# Patient Record
Sex: Female | Born: 1940
Health system: Southern US, Community
[De-identification: ages and names within clinical notes are randomized; demographics above are authoritative.]

## PROBLEM LIST (undated history)

## (undated) DIAGNOSIS — M25561 Pain in right knee: Secondary | ICD-10-CM

## (undated) DIAGNOSIS — K219 Gastro-esophageal reflux disease without esophagitis: Secondary | ICD-10-CM

## (undated) DIAGNOSIS — G4700 Insomnia, unspecified: Secondary | ICD-10-CM

## (undated) DIAGNOSIS — Z9889 Other specified postprocedural states: Secondary | ICD-10-CM

## (undated) DIAGNOSIS — M5030 Other cervical disc degeneration, unspecified cervical region: Secondary | ICD-10-CM

## (undated) DIAGNOSIS — I10 Essential (primary) hypertension: Secondary | ICD-10-CM

## (undated) DIAGNOSIS — E7800 Pure hypercholesterolemia, unspecified: Secondary | ICD-10-CM

## (undated) DIAGNOSIS — R911 Solitary pulmonary nodule: Secondary | ICD-10-CM

## (undated) DIAGNOSIS — N183 Chronic kidney disease, stage 3 unspecified: Secondary | ICD-10-CM

## (undated) DIAGNOSIS — D696 Thrombocytopenia, unspecified: Secondary | ICD-10-CM

## (undated) DIAGNOSIS — R001 Bradycardia, unspecified: Secondary | ICD-10-CM

## (undated) DIAGNOSIS — J439 Emphysema, unspecified: Secondary | ICD-10-CM

## (undated) DIAGNOSIS — M069 Rheumatoid arthritis, unspecified: Secondary | ICD-10-CM

## (undated) DIAGNOSIS — M8580 Other specified disorders of bone density and structure, unspecified site: Secondary | ICD-10-CM

## (undated) DIAGNOSIS — F419 Anxiety disorder, unspecified: Secondary | ICD-10-CM

## (undated) DIAGNOSIS — R112 Nausea with vomiting, unspecified: Secondary | ICD-10-CM

## (undated) DIAGNOSIS — M1990 Unspecified osteoarthritis, unspecified site: Secondary | ICD-10-CM

## (undated) HISTORY — DX: Emphysema, unspecified: J43.9

## (undated) HISTORY — PX: OOPHORECTOMY: SHX86

## (undated) HISTORY — DX: Pure hypercholesterolemia, unspecified: E78.00

## (undated) HISTORY — DX: Anxiety disorder, unspecified: F41.9

## (undated) HISTORY — DX: Solitary pulmonary nodule: R91.1

## (undated) HISTORY — PX: HAND SURGERY: SHX662

## (undated) HISTORY — DX: Gastro-esophageal reflux disease without esophagitis: K21.9

## (undated) HISTORY — DX: Insomnia, unspecified: G47.00

## (undated) HISTORY — DX: Chronic kidney disease, stage 3 unspecified: N18.30

## (undated) HISTORY — DX: Rheumatoid arthritis, unspecified: M06.9

## (undated) HISTORY — PX: FOOT SURGERY: SHX648

## (undated) HISTORY — DX: Bradycardia, unspecified: R00.1

## (undated) HISTORY — PX: HEMORRHOID SURGERY: SHX153

## (undated) HISTORY — DX: Thrombocytopenia, unspecified: D69.6

## (undated) HISTORY — PX: COLONOSCOPY: SHX174

## (undated) HISTORY — DX: Essential (primary) hypertension: I10

## (undated) HISTORY — DX: Pain in right knee: M25.561

## (undated) HISTORY — DX: Other specified disorders of bone density and structure, unspecified site: M85.80

## (undated) HISTORY — DX: Other cervical disc degeneration, unspecified cervical region: M50.30

---

## 1978-07-27 HISTORY — PX: BARTHOLIN GLAND CYST EXCISION: SHX565

## 1986-07-27 HISTORY — PX: BREAST SURGERY: SHX581

## 1989-07-27 HISTORY — PX: ABDOMINAL HYSTERECTOMY: SHX81

## 1998-09-17 ENCOUNTER — Ambulatory Visit (HOSPITAL_COMMUNITY): Admission: RE | Admit: 1998-09-17 | Discharge: 1998-09-17 | Payer: Self-pay | Admitting: Orthopedic Surgery

## 1999-08-27 ENCOUNTER — Encounter: Admission: RE | Admit: 1999-08-27 | Discharge: 1999-08-27 | Payer: Self-pay | Admitting: Obstetrics and Gynecology

## 1999-08-27 ENCOUNTER — Encounter: Payer: Self-pay | Admitting: Obstetrics and Gynecology

## 1999-12-30 ENCOUNTER — Ambulatory Visit (HOSPITAL_BASED_OUTPATIENT_CLINIC_OR_DEPARTMENT_OTHER): Admission: RE | Admit: 1999-12-30 | Discharge: 1999-12-30 | Payer: Self-pay | Admitting: Orthopedic Surgery

## 2000-08-18 ENCOUNTER — Ambulatory Visit (HOSPITAL_BASED_OUTPATIENT_CLINIC_OR_DEPARTMENT_OTHER): Admission: RE | Admit: 2000-08-18 | Discharge: 2000-08-18 | Payer: Self-pay | Admitting: Orthopedic Surgery

## 2000-10-13 ENCOUNTER — Encounter: Payer: Self-pay | Admitting: Obstetrics and Gynecology

## 2000-10-13 ENCOUNTER — Encounter: Admission: RE | Admit: 2000-10-13 | Discharge: 2000-10-13 | Payer: Self-pay | Admitting: Obstetrics and Gynecology

## 2001-10-20 ENCOUNTER — Encounter: Admission: RE | Admit: 2001-10-20 | Discharge: 2001-10-20 | Payer: Self-pay | Admitting: Obstetrics and Gynecology

## 2001-10-20 ENCOUNTER — Encounter: Payer: Self-pay | Admitting: Obstetrics and Gynecology

## 2002-10-10 ENCOUNTER — Encounter: Payer: Self-pay | Admitting: Internal Medicine

## 2002-10-10 ENCOUNTER — Ambulatory Visit (HOSPITAL_COMMUNITY): Admission: RE | Admit: 2002-10-10 | Discharge: 2002-10-10 | Payer: Self-pay | Admitting: Internal Medicine

## 2002-11-18 ENCOUNTER — Emergency Department (HOSPITAL_COMMUNITY): Admission: EM | Admit: 2002-11-18 | Discharge: 2002-11-18 | Payer: Self-pay | Admitting: Emergency Medicine

## 2002-11-18 ENCOUNTER — Encounter: Payer: Self-pay | Admitting: Emergency Medicine

## 2003-03-22 ENCOUNTER — Encounter: Admission: RE | Admit: 2003-03-22 | Discharge: 2003-03-22 | Payer: Self-pay | Admitting: Obstetrics and Gynecology

## 2003-03-22 ENCOUNTER — Encounter: Payer: Self-pay | Admitting: Obstetrics and Gynecology

## 2003-07-12 ENCOUNTER — Ambulatory Visit (HOSPITAL_COMMUNITY): Admission: RE | Admit: 2003-07-12 | Discharge: 2003-07-12 | Payer: Self-pay | Admitting: Internal Medicine

## 2004-03-01 ENCOUNTER — Encounter: Admission: RE | Admit: 2004-03-01 | Discharge: 2004-03-01 | Payer: Self-pay | Admitting: Orthopedic Surgery

## 2004-03-21 ENCOUNTER — Ambulatory Visit (HOSPITAL_COMMUNITY): Admission: RE | Admit: 2004-03-21 | Discharge: 2004-03-21 | Payer: Self-pay | Admitting: Internal Medicine

## 2004-04-24 ENCOUNTER — Ambulatory Visit (HOSPITAL_COMMUNITY): Admission: RE | Admit: 2004-04-24 | Discharge: 2004-04-24 | Payer: Self-pay | Admitting: Obstetrics and Gynecology

## 2004-12-12 ENCOUNTER — Ambulatory Visit (HOSPITAL_COMMUNITY): Admission: RE | Admit: 2004-12-12 | Discharge: 2004-12-12 | Payer: Self-pay | Admitting: Family Medicine

## 2005-01-19 ENCOUNTER — Ambulatory Visit (HOSPITAL_COMMUNITY): Admission: RE | Admit: 2005-01-19 | Discharge: 2005-01-19 | Payer: Self-pay | Admitting: Otolaryngology

## 2005-02-03 ENCOUNTER — Ambulatory Visit (HOSPITAL_COMMUNITY): Admission: RE | Admit: 2005-02-03 | Discharge: 2005-02-03 | Payer: Self-pay | Admitting: Colon and Rectal Surgery

## 2006-01-26 ENCOUNTER — Ambulatory Visit (HOSPITAL_COMMUNITY): Admission: RE | Admit: 2006-01-26 | Discharge: 2006-01-26 | Payer: Self-pay | Admitting: Internal Medicine

## 2006-06-04 ENCOUNTER — Ambulatory Visit (HOSPITAL_COMMUNITY): Admission: RE | Admit: 2006-06-04 | Discharge: 2006-06-04 | Payer: Self-pay | Admitting: Otolaryngology

## 2007-12-28 ENCOUNTER — Other Ambulatory Visit: Admission: RE | Admit: 2007-12-28 | Discharge: 2007-12-28 | Payer: Self-pay | Admitting: Obstetrics and Gynecology

## 2008-01-12 ENCOUNTER — Ambulatory Visit (HOSPITAL_COMMUNITY): Admission: RE | Admit: 2008-01-12 | Discharge: 2008-01-12 | Payer: Self-pay | Admitting: Internal Medicine

## 2008-01-18 ENCOUNTER — Emergency Department (HOSPITAL_COMMUNITY): Admission: EM | Admit: 2008-01-18 | Discharge: 2008-01-18 | Payer: Self-pay | Admitting: Emergency Medicine

## 2008-01-25 ENCOUNTER — Ambulatory Visit (HOSPITAL_COMMUNITY): Admission: RE | Admit: 2008-01-25 | Discharge: 2008-01-25 | Payer: Self-pay | Admitting: Internal Medicine

## 2008-04-18 ENCOUNTER — Ambulatory Visit (HOSPITAL_COMMUNITY): Admission: RE | Admit: 2008-04-18 | Discharge: 2008-04-18 | Payer: Self-pay | Admitting: Internal Medicine

## 2008-05-03 ENCOUNTER — Ambulatory Visit: Payer: Self-pay | Admitting: Pulmonary Disease

## 2008-05-03 DIAGNOSIS — M069 Rheumatoid arthritis, unspecified: Secondary | ICD-10-CM | POA: Insufficient documentation

## 2008-05-03 DIAGNOSIS — R930 Abnormal findings on diagnostic imaging of skull and head, not elsewhere classified: Secondary | ICD-10-CM | POA: Insufficient documentation

## 2008-05-07 ENCOUNTER — Ambulatory Visit: Admission: RE | Admit: 2008-05-07 | Discharge: 2008-05-07 | Payer: Self-pay | Admitting: Pulmonary Disease

## 2008-05-07 ENCOUNTER — Ambulatory Visit: Payer: Self-pay | Admitting: Pulmonary Disease

## 2008-05-07 ENCOUNTER — Encounter: Payer: Self-pay | Admitting: Pulmonary Disease

## 2008-05-11 ENCOUNTER — Ambulatory Visit: Payer: Self-pay | Admitting: Pulmonary Disease

## 2008-06-19 ENCOUNTER — Ambulatory Visit: Payer: Self-pay | Admitting: Pulmonary Disease

## 2008-09-17 ENCOUNTER — Ambulatory Visit: Payer: Self-pay | Admitting: Pulmonary Disease

## 2008-11-08 ENCOUNTER — Encounter: Payer: Self-pay | Admitting: Pulmonary Disease

## 2008-12-12 ENCOUNTER — Ambulatory Visit (HOSPITAL_COMMUNITY): Admission: RE | Admit: 2008-12-12 | Discharge: 2008-12-12 | Payer: Self-pay | Admitting: Family Medicine

## 2008-12-31 ENCOUNTER — Other Ambulatory Visit: Admission: RE | Admit: 2008-12-31 | Discharge: 2008-12-31 | Payer: Self-pay | Admitting: Obstetrics and Gynecology

## 2008-12-31 ENCOUNTER — Ambulatory Visit: Payer: Self-pay | Admitting: Obstetrics and Gynecology

## 2008-12-31 ENCOUNTER — Encounter: Payer: Self-pay | Admitting: Obstetrics and Gynecology

## 2009-02-20 ENCOUNTER — Ambulatory Visit (HOSPITAL_COMMUNITY): Admission: RE | Admit: 2009-02-20 | Discharge: 2009-02-20 | Payer: Self-pay | Admitting: Obstetrics and Gynecology

## 2009-02-25 ENCOUNTER — Ambulatory Visit: Payer: Self-pay | Admitting: Pulmonary Disease

## 2009-02-25 DIAGNOSIS — J310 Chronic rhinitis: Secondary | ICD-10-CM | POA: Insufficient documentation

## 2009-05-17 ENCOUNTER — Ambulatory Visit (HOSPITAL_COMMUNITY): Admission: RE | Admit: 2009-05-17 | Discharge: 2009-05-17 | Payer: Self-pay | Admitting: Internal Medicine

## 2009-05-23 ENCOUNTER — Encounter: Payer: Self-pay | Admitting: Pulmonary Disease

## 2009-05-23 ENCOUNTER — Ambulatory Visit (HOSPITAL_COMMUNITY): Admission: RE | Admit: 2009-05-23 | Discharge: 2009-05-23 | Payer: Self-pay | Admitting: Internal Medicine

## 2009-06-03 ENCOUNTER — Encounter: Payer: Self-pay | Admitting: Emergency Medicine

## 2009-08-05 ENCOUNTER — Ambulatory Visit: Payer: Self-pay | Admitting: Pulmonary Disease

## 2009-09-02 ENCOUNTER — Encounter: Payer: Self-pay | Admitting: Pulmonary Disease

## 2010-01-09 ENCOUNTER — Ambulatory Visit: Payer: Self-pay | Admitting: Obstetrics and Gynecology

## 2010-01-09 ENCOUNTER — Other Ambulatory Visit: Admission: RE | Admit: 2010-01-09 | Discharge: 2010-01-09 | Payer: Self-pay | Admitting: Obstetrics and Gynecology

## 2010-01-14 ENCOUNTER — Ambulatory Visit: Payer: Self-pay | Admitting: Obstetrics and Gynecology

## 2010-01-22 ENCOUNTER — Encounter: Payer: Self-pay | Admitting: Pulmonary Disease

## 2010-01-28 ENCOUNTER — Ambulatory Visit (HOSPITAL_COMMUNITY): Admission: RE | Admit: 2010-01-28 | Discharge: 2010-01-28 | Payer: Self-pay | Admitting: Internal Medicine

## 2010-02-10 ENCOUNTER — Ambulatory Visit: Payer: Self-pay | Admitting: Pulmonary Disease

## 2010-02-27 ENCOUNTER — Ambulatory Visit (HOSPITAL_COMMUNITY): Admission: RE | Admit: 2010-02-27 | Discharge: 2010-02-27 | Payer: Self-pay | Admitting: Internal Medicine

## 2010-04-02 ENCOUNTER — Encounter: Payer: Self-pay | Admitting: Pulmonary Disease

## 2010-08-26 NOTE — Letter (Signed)
Summary: [name] MD  [name] MD   Imported By: Lennie Odor 04/21/2010 12:04:52  _____________________________________________________________________  External Attachment:    Type:   Image     Comment:   External Document

## 2010-08-26 NOTE — Assessment & Plan Note (Signed)
Summary: 6 months w/cxr/apc   Copy to:  [name] Primary Provider/Referring Provider:  Elfredia Nevins  CC:  Follow-up visit with cxr, Breathing is doingt fine, no cough, and Pt states she is doing fine and have no complaints today.  History of Present Illness: I saw Ms. Blake in follow up for her abnormal chest xray in the setting of rheumatoid arthritis.  She has been doing well.  She does not have cough, wheeze, sputum, or chest pain.  She has been getting a tickle in her throat.  She feels a globus sensation.  She has been getting sinus congestion and feels like there is drainage in her throat.  She was tried on anti-reflux therapy, but this did not help her throat.  She has nasonex and nasal irrigation, but does not use this on a regular basis.  She had her MTX dose decreased recently.  She was having fatigue, and feeling depressed.  These symptoms improved after the MTX dose was decreased.  She is now walking 2 to 4 miles per day w/o difficulty.    CXR  Procedure date:  02/10/2010  Findings:      CHEST - 2 VIEW   Comparison: 08/05/2009   Findings: Heart size remains normal.  Lungs mildly hyperaerated. Stable peribronchial thickening with mildly accentuated lung markings as before.  No discrete nodules or infiltrates.  No pleural fluid.   Osseous structures intact with stable scoliosis.   IMPRESSION: Chronic changes as above - no acute findings or interval change.   Preventive Screening-Counseling & Management  Alcohol-Tobacco     Smoking Status: quit     Packs/Day: 0.75     Year Started: At age 49     Year Quit: 2007  Allergies: 1)  ! * All Narcotics  Past History:  Past Medical History: Reviewed history from 05/03/2008 and no changes required. Rheumatoid arthritis Dyslipidemia Colon polyps Hemorrhoids  Past Surgical History: Reviewed history from 05/03/2008 and no changes required. Left hand surgery Removal Morton neuroma Benign left breast  cyst removal Hysterectomy Hemorrhoidectomy  Social History: Smoking Status:  quit Packs/Day:  0.75  Vital Signs:  Patient profile:   70 year old female Height:      67 inches Weight:      169.4 pounds O2 Sat:      96 % on Room air Temp:     98.5 degrees F oral Pulse rate:   66 / minute BP sitting:   120 / 72  (right arm) Cuff size:   regular  Vitals Entered By: Carver Fila (February 10, 2010 8:56 AM)  O2 Flow:  Room air CC: Follow-up visit with cxr, Breathing is doingt fine, no cough, Pt states she is doing fine and have no complaints today Is Patient Diabetic? No Comments meds and allergies updated Phone number updated  Carver Fila  February 10, 2010 8:57 AM     Physical Exam  General:  normal appearance and healthy appearing.   Nose:  clear nasal discharge.   Mouth:  no oral lesion Neck:  no JVD Lungs:  Decreased breath sounds, good air entry, no wheezing or rales Heart:  regular rhythm, normal rate, and no murmurs.   Extremities:  no edema or cyanosis Cervical Nodes:  no significant adenopathy   Impression & Recommendations:  Problem # 1:  ABNORMAL CHEST XRAY (ICD-793.1)  Her chest xray has been stable.  She did not have any evidence for recurrence of the rheumatoid lung involvement, or any evidence to suggest  maligancy.   Will plan on repeating her chest xray in six months.  I have advised her that if her chest xray remains stable at next visit would then consider stretching her visits out to every 8 to 12 months.  Problem # 2:  ARTHRITIS, RHEUMATOID (ICD-714.0)  She has her MTX dose decreased recently, but does not appear to have any worsening of her pulmonary symptoms.  She is to f/u with rheumatology.  Problem # 3:  RHINITIS (ICD-472.0)  She has post-nasal drip with throat irritation.  I have advised her to use nasal irrigation and nasonex on a regular basis for the next several weeks  Medications Added to Medication List This Visit: 1)  Methotrexate 2.5 Mg  Tabs (Methotrexate sodium) .... 5 tabs by mouth weekly 2)  Nasonex 50 Mcg/act Susp (Mometasone furoate) .... Two sprays once daily as needed 3)  Caltrate 600+d 600-400 Mg-unit Tabs (Calcium carbonate-vitamin d) .... Once daily 4)  Vitamin D High Potency 1000 Unit Caps (Cholecalciferol) .... Once daily  Complete Medication List: 1)  Methotrexate 2.5 Mg Tabs (Methotrexate sodium) .... 5 tabs by mouth weekly 2)  Folic Acid 800 Mcg Tabs (Folic acid) .Marland Kitchen.. 1 by mouth daily 3)  Nasonex 50 Mcg/act Susp (Mometasone furoate) .... Two sprays once daily as needed 4)  Crestor 10 Mg Tabs (Rosuvastatin calcium) .Marland Kitchen.. 1 by mouth daily 5)  Omeprazole 20 Mg Cpdr (Omeprazole) .Marland Kitchen.. 1 by mouth daily 6)  Alprazolam 0.5 Mg Tabs (Alprazolam) .Marland Kitchen.. 1 by mouth at bedtime 7)  Caltrate 600+d 600-400 Mg-unit Tabs (Calcium carbonate-vitamin d) .... Once daily 8)  Vitamin D High Potency 1000 Unit Caps (Cholecalciferol) .... Once daily  Other Orders: T-2 View CXR (71020TC) Est. Patient Level III (16109)  Patient Instructions: 1)  Follow up in 6 months with Chest xray   Immunization History:  Influenza Immunization History:    Influenza:  historical (04/26/2009)

## 2010-08-26 NOTE — Assessment & Plan Note (Signed)
Summary: rov/jd   Copy to:  [name] Primary Provider/Referring Provider:  Elfredia Nevins  CC:  Follow-up rhinistis and abnormal cxr. CXR done today.Marland Kitchen  History of Present Illness: I saw Bethany Grant in follow up for her abnormal chest xray in the setting of rheumatoid arthritis.  She had to stop MTX recently due to concern for throat congestion.  She has since be restarted on MTX.  She was also started on therapy for GERD, and this improved her throat irritation.  Otherwise she has been doing well.  She denies cough, wheeze, chest pain, fever, dyspnea, or hemoptysis.  She had CT chest in Oct which showed decrease in size of LLL nodule.  CT of Chest  Procedure date:  05/23/2009  Findings:       Findings:   Atherosclerotic calcifications aorta, ascending aorta 3.8 x 3.6 cm   diameter image 29.   No thoracic adenopathy, with hilar assessment limited by lack of IV   contrast.   Visualized portion of upper abdomen unremarkable.   Dextroconvex thoracic scoliosis.   Question bone island in a posterior right rib image 35.    Lungs appear hyperexpanded with flattening of diaphragms.   Small focus of questionable scarring at right apex unchanged, 6.6   mm diameter.   Minimal peribronchial thickening.   Interstitial changes at azygo esophageal recess of right lower lobe   are stable.   Left lower lobe nodule decreased in size, 4.5 to 3.3 mm, image 44.   Peripheral and subpleural reticular changes in posterior left lower   lobe have decreased in prominence.   No new or enlarging pulmonary masses / nodules are identified.   No new pulmonary infiltrate, pleural effusion or pneumothorax.    IMPRESSION:   Stable parenchymal lung changes and left lower lobe nodule as   above.   No new abnormalities.    Read By:  Lollie Marrow,  M.D.  CXR  Procedure date:  08/05/2009  Findings:      Findings: No active infiltrate or effusion is seen.  The lungs are slightly hyperaerated  with mild peribronchial thickening noted. The small lung parenchymal opacities noted on CT of the chest previously are too small to be detected by chest x-ray but no lung nodule is currently seen.  The heart is within upper limits of normal.  There are degenerative changes in the thoracic spine with mild scoliosis present.   IMPRESSION: No active lung disease.  Slight hyperaeration.     Current Medications (verified): 1)  Crestor 10 Mg Tabs (Rosuvastatin Calcium) .Marland Kitchen.. 1 By Mouth Daily 2)  Alprazolam 0.5 Mg Tabs (Alprazolam) .Marland Kitchen.. 1 By Mouth At Bedtime 3)  Methotrexate 2.5 Mg Tabs (Methotrexate Sodium) .... 8 Tabs By Mouth Weekly 4)  Folic Acid 800 Mcg Tabs (Folic Acid) .Marland Kitchen.. 1 By Mouth Daily 5)  Nasonex 50 Mcg/act Susp (Mometasone Furoate) .... Two Sprays Once Daily 6)  Omeprazole 20 Mg Cpdr (Omeprazole) .Marland Kitchen.. 1 By Mouth Daily  Allergies (verified): 1)  ! * All Narcotics  Past History:  Past Medical History: Last updated: 05/03/2008 Rheumatoid arthritis Dyslipidemia Colon polyps Hemorrhoids  Past Surgical History: Last updated: 05/03/2008 Left hand surgery Removal Morton neuroma Benign left breast cyst removal Hysterectomy Hemorrhoidectomy  Family History: Last updated: 05/03/2008 Father - Old age Mother - Lung cancer Sister - Rheumatoid arthritis, Lung cancer Sister - Lung cancer Brother - Lung cancer  Social History: Last updated: 05/03/2008 Married.  Three kids.  Worked in Engineer, site.  Vital  Signs:  Patient profile:   70 year old female Height:      67 inches (170.18 cm) Weight:      179 pounds (81.36 kg) BMI:     28.14 O2 Sat:      97 % on Room air Temp:     97.9 degrees F (36.61 degrees C) oral Pulse rate:   88 / minute BP sitting:   132 / 78  (left arm) Cuff size:   regular  Vitals Entered By: Michel Bickers CMA (August 05, 2009 2:26 PM)  O2 Flow:  Room air  Physical Exam  General:  normal appearance and healthy appearing.   Nose:  clear  nasal discharge.   Mouth:  no oral lesion Neck:  no JVD Lungs:  Decreased breath sounds, good air entry, no wheezing or rales Heart:  regular rhythm, normal rate, and no murmurs.   Abdomen:  bowel sounds positive; abdomen soft and non-tender without masses, or organomegaly Extremities:  no edema or cyanosis Cervical Nodes:  no significant adenopathy   Impression & Recommendations:  Problem # 1:  ABNORMAL CHEST XRAY (ICD-793.1)  Her chest xray has been stable.  She did not have any evidence for recurrence of the rheumatoid lung involvement, or any evidence to suggest maligancy.   Will plan on repeating her chest xray in six months.  Problem # 2:  ARTHRITIS, RHEUMATOID (ICD-714.0) Per rheumatology.  Medications Added to Medication List This Visit: 1)  Omeprazole 20 Mg Cpdr (Omeprazole) .Marland Kitchen.. 1 by mouth daily  Complete Medication List: 1)  Crestor 10 Mg Tabs (Rosuvastatin calcium) .Marland Kitchen.. 1 by mouth daily 2)  Alprazolam 0.5 Mg Tabs (Alprazolam) .Marland Kitchen.. 1 by mouth at bedtime 3)  Methotrexate 2.5 Mg Tabs (Methotrexate sodium) .... 8 tabs by mouth weekly 4)  Folic Acid 800 Mcg Tabs (Folic acid) .Marland Kitchen.. 1 by mouth daily 5)  Nasonex 50 Mcg/act Susp (Mometasone furoate) .... Two sprays once daily 6)  Omeprazole 20 Mg Cpdr (Omeprazole) .Marland Kitchen.. 1 by mouth daily  Other Orders: T-2 View CXR (71020TC) Est. Patient Level III (16109)  Patient Instructions: 1)  Follow up in 6 months with a chest xray

## 2010-08-27 ENCOUNTER — Ambulatory Visit: Admit: 2010-08-27 | Payer: Self-pay | Admitting: Pulmonary Disease

## 2010-08-27 ENCOUNTER — Ambulatory Visit (INDEPENDENT_AMBULATORY_CARE_PROVIDER_SITE_OTHER): Payer: MEDICARE | Admitting: Pulmonary Disease

## 2010-08-27 ENCOUNTER — Ambulatory Visit (INDEPENDENT_AMBULATORY_CARE_PROVIDER_SITE_OTHER)
Admission: RE | Admit: 2010-08-27 | Discharge: 2010-08-27 | Disposition: A | Discharge status: Home / Self Care | Payer: MEDICARE | Source: Ambulatory Visit | Attending: Pulmonary Disease | Admitting: Pulmonary Disease

## 2010-08-27 ENCOUNTER — Other Ambulatory Visit: Payer: Self-pay | Admitting: Pulmonary Disease

## 2010-08-27 ENCOUNTER — Encounter: Payer: Self-pay | Admitting: Pulmonary Disease

## 2010-08-27 DIAGNOSIS — R9389 Abnormal findings on diagnostic imaging of other specified body structures: Secondary | ICD-10-CM

## 2010-08-27 DIAGNOSIS — M069 Rheumatoid arthritis, unspecified: Secondary | ICD-10-CM

## 2010-08-27 DIAGNOSIS — R918 Other nonspecific abnormal finding of lung field: Secondary | ICD-10-CM

## 2010-08-27 DIAGNOSIS — R911 Solitary pulmonary nodule: Secondary | ICD-10-CM

## 2010-08-27 DIAGNOSIS — J310 Chronic rhinitis: Secondary | ICD-10-CM

## 2010-08-29 NOTE — Letter (Signed)
Summary: [name] MD  [name] MD   Imported By: Sherian Rein 09/14/2009 11:29:44  _____________________________________________________________________  External Attachment:    Type:   Image     Comment:   External Document

## 2010-09-03 NOTE — Assessment & Plan Note (Signed)
Summary: 6 month rov   Vital Signs:  Patient profile:   70 year old female Height:      67 inches Weight:      173.50 pounds BMI:     27.27 O2 Sat:      99 % on Room air Temp:     97.9 degrees F oral Pulse rate:   73 / minute BP sitting:   120 / 70  (left arm) Cuff size:   regular  Vitals Entered By: Carver Fila (August 27, 2010 1:37 PM)  O2 Flow:  Room air CC: 6 month f/u w/ cxr. Pt states her breathing has been fine and has no complaints today Comments meds and allergies updated Phone number updated Carver Fila  August 27, 2010 1:37 PM    Copy to:  [name] Primary Provider/Referring Provider:  Elfredia Nevins  CC:  6 month f/u w/ cxr. Pt states her breathing has been fine and has no complaints today.  History of Present Illness: I saw Ms. Haddox in follow up for her abnormal chest xray in the setting of rheumatoid arthritis.  She has been doing well.  She had a cold earlier in winter, but this has resolved.  She denies fever, cough, sputum, chest pain, wheeze, or hemoptysis.  Her RA is controlled on methotrexate, although she occasionally has to use some prednisone.   Preventive Screening-Counseling & Management  Alcohol-Tobacco     Smoking Status: quit  EKG  Procedure date:  08/27/2010  Findings:       Comparison: 02/10/2010    Findings: Trachea is midline.  Heart size normal.  Thoracic aorta   is calcified.  Mild biapical pleural parenchymal scarring.  Lungs   are otherwise clear.  No pleural fluid.    IMPRESSION:   No acute findings.  Current Medications (verified): 1)  Methotrexate 2.5 Mg Tabs (Methotrexate Sodium) .... 8 Tabs By Mouth Weekly 2)  Folic Acid 800 Mcg Tabs (Folic Acid) .Marland Kitchen.. 1 By Mouth Daily 3)  Nasonex 50 Mcg/act Susp (Mometasone Furoate) .... Two Sprays Once Daily As Needed 4)  Crestor 10 Mg Tabs (Rosuvastatin Calcium) .Marland Kitchen.. 1 By Mouth Daily 5)  Alprazolam 0.5 Mg Tabs (Alprazolam) .Marland Kitchen.. 1 By Mouth At Bedtime 6)  Caltrate 600+d  600-400 Mg-Unit Tabs (Calcium Carbonate-Vitamin D) .... Once Daily 7)  Vitamin D High Potency 1000 Unit Caps (Cholecalciferol) .... Once Daily  Allergies (verified): 1)  ! * All Narcotics  Past History:  Past Medical History: Reviewed history from 05/03/2008 and no changes required. Rheumatoid arthritis Dyslipidemia Colon polyps Hemorrhoids  Past Surgical History: Reviewed history from 05/03/2008 and no changes required. Left hand surgery Removal Morton neuroma Benign left breast cyst removal Hysterectomy Hemorrhoidectomy  Social History: Married.  Three kids.  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 Quit 2007. 1 ppd. started in teens.   Physical Exam  General:  normal appearance and healthy appearing.   Nose:  clear nasal discharge.   Mouth:  no oral lesion Neck:  no JVD Lungs:  Decreased breath sounds, good air entry, no wheezing or rales Heart:  regular rhythm, normal rate, and no murmurs.   Extremities:  no edema or cyanosis Neurologic:  normal CN II-XII and strength normal.   Cervical Nodes:  no significant adenopathy Psych:  alert and cooperative; normal mood and affect; normal attention span and concentration   Impression & Recommendations:  Problem # 1:  ABNORMAL CHEST XRAY (ICD-793.1) Stable, and todays CXR is clear.  She has family history of lung cancer, and would like to continue radiographic monitoring for now.  Will f/u CXR in 9 months, and continue clinical monitoring.  Problem # 2:  ARTHRITIS, RHEUMATOID (ICD-714.0) She is to f/u with rheumatology.  Problem # 3:  RHINITIS (ICD-472.0)  Stable.  Medications Added to Medication List This Visit: 1)  Methotrexate 2.5 Mg Tabs (Methotrexate sodium) .... 8 tabs by mouth weekly  Complete Medication List: 1)  Methotrexate 2.5 Mg Tabs (Methotrexate sodium) .... 8 tabs by mouth weekly 2)  Folic Acid 800 Mcg Tabs (Folic acid) .Marland Kitchen.. 1 by mouth daily 3)  Nasonex 50 Mcg/act Susp (Mometasone  furoate) .... Two sprays once daily as needed 4)  Crestor 10 Mg Tabs (Rosuvastatin calcium) .Marland Kitchen.. 1 by mouth daily 5)  Alprazolam 0.5 Mg Tabs (Alprazolam) .Marland Kitchen.. 1 by mouth at bedtime 6)  Caltrate 600+d 600-400 Mg-unit Tabs (Calcium carbonate-vitamin d) .... Once daily 7)  Vitamin D High Potency 1000 Unit Caps (Cholecalciferol) .... Once daily  Other Orders: T-2 View CXR (71020TC) Est. Patient Level III (57846)  Patient Instructions: 1)  Follow up in 9 months with Chest xray   Orders Added: 1)  T-2 View CXR [71020TC] 2)  Est. Patient Level III [96295]   Immunization History:  Influenza Immunization History:    Influenza:  historical (04/26/2010)   Immunization History:  Influenza Immunization History:    Influenza:  Historical (04/26/2010)

## 2010-10-07 ENCOUNTER — Ambulatory Visit (INDEPENDENT_AMBULATORY_CARE_PROVIDER_SITE_OTHER): Payer: MEDICARE | Admitting: Obstetrics and Gynecology

## 2010-10-07 DIAGNOSIS — B3731 Acute candidiasis of vulva and vagina: Secondary | ICD-10-CM

## 2010-10-07 DIAGNOSIS — B373 Candidiasis of vulva and vagina: Secondary | ICD-10-CM

## 2010-10-07 DIAGNOSIS — N766 Ulceration of vulva: Secondary | ICD-10-CM

## 2010-10-07 DIAGNOSIS — N898 Other specified noninflammatory disorders of vagina: Secondary | ICD-10-CM

## 2010-10-07 DIAGNOSIS — N952 Postmenopausal atrophic vaginitis: Secondary | ICD-10-CM

## 2010-11-24 ENCOUNTER — Encounter: Payer: Self-pay | Admitting: Pulmonary Disease

## 2010-12-09 NOTE — Op Note (Signed)
NAMEBERRY, GALLACHER                ACCOUNT NO.:  [id number]   MEDICAL RECORD NO.:  [id number]          PATIENT TYPE:  AMB   LOCATION:  CARD                         FACILITY:  [facility]   PHYSICIAN:  Coralyn Helling, MD        DATE OF BIRTH:  1941-05-23   DATE OF PROCEDURE:  05/07/2008  DATE OF DISCHARGE:                               OPERATIVE REPORT   PREOPERATIVE DIAGNOSIS:  Rule out lung cancer.   POSTOPERATIVE DIAGNOSIS:  Rule out lung cancer.   PROCEDURE:  Bronchoscopy with bronchoalveolar lavage, brush cytology and  transbronchial biopsy from the left lower lobe.   Ms. Scifres is a 70 year old female who has a history of rheumatoid  arthritis.  She has alveolar and interstitial infiltrates on her CT scan  of the chest which had progressed from July to September.  She does also  have a strong family history of lung cancer and has a previous history  of tobacco abuse.  She is therefore scheduled for further evaluation of  this with bronchoscopy.  The procedure was explained to the patient.  Risks were detailed as bleeding, infection, pneumothorax and  nondiagnosis.   DESCRIPTION OF PROCEDURE:  The patient was brought to the bronchoscopy  suite.  She was given a total of 6 mg of Versed and 50 mcg of fentanyl  intravenously for sedation and analgesia.  Cetacaine spray was applied  to the posterior pharynx for topical anesthesia.  The bronchoscope was  entered orally.  The vocal cords visualized and had normal motion.  A  total of 12 mL of 2% lidocaine was instilled topical anesthesia.  The  bronchoscope was entered into the trachea.  The carina was visualized.  There was clear secretions which were easily cleared with suctioning.  The bronchoscope was then entered in the right main bronchus.  Again  there was clear secretions.  Otherwise, there is no obvious  endobronchial lesions.  The right upper, middle and lower lobe orifices  were well visualized and appeared normal.  The  bronchoscope was then  entered into the left main bronchus.  The left upper and lingular lobe  orifices were visualized.  There were no obvious endobronchial lesions.  The bronchoscope was then entered in the left lower lobe and 120 mL of  saline was instilled to the left lower lobe with approximately 35 mL of  white cloudy fluid returned.  Then brush cytology was done from the left  lower lobe.  Then using fluoroscopic guidance, transbronchial biopsy was  obtained from the left lower lobe.  There was a minimal amount of  bleeding which resolved after installation of 20 mL of iced saline.  There was of no other obvious complications.  The bronchoscope was  withdrawn.   The patient was returned to the recovery room in stable condition.  A  postprocedure chest x-ray is pending at this time.   The specimens will be sent for cytology, microbiology and surgical  pathology.  I will call the patient with results of the bronchoscopy and  follow up with her in the office later this week.  Coralyn Helling, MD  Electronically Signed     VS/MEDQ  D:  05/07/2008  T:  05/07/2008  Job:  [id number]

## 2010-12-12 NOTE — Op Note (Signed)
Bethany Grant, Bethany Grant                          ACCOUNT NO.:  [id number]   MEDICAL RECORD NO.:  [id number]                   PATIENT TYPE:  AMB   LOCATION:  DAY                                  FACILITY:  [facility]   PHYSICIAN:  Lionel December, M.D.                 DATE OF BIRTH:  08-05-1940   DATE OF PROCEDURE:  07/12/2003  DATE OF DISCHARGE:                                 OPERATIVE REPORT   PROCEDURE:  Total colonoscopy.   INDICATIONS FOR PROCEDURE:  Bethany Grant is a 70 year old Caucasian female who is  undergoing screening colonoscopy.  She does give history of chronic  constipation which has gotten worse over the last six months; however, she  has not experienced any abdominal pain, weight loss, or rectal bleeding.  Family history is negative for colorectal carcinoma.  The procedure and  risks were reviewed with the patient, and informed consent was obtained.   PREOPERATIVE MEDICATIONS:  Demerol 50 mg IV, Versed 7 mg IV in divided dose.   FINDINGS:  The procedure was performed in the endoscopy suite.  The  patient's vital signs and O2 saturations were monitored during the procedure  and remained stable.  The patient was placed in the left lateral recumbent  position and rectal examination performed.  She had sentinel skin tags.  Digital exam was normal.  The Olympus videoscope was placed into the rectum  and advanced into the region of the sigmoid colon and beyond.  The  preparation was satisfactory.  The scope was passed to the cecum which was  identified by the appendiceal orifice and ileocecal valve.  There was a  small polyp next to the appendiceal orifice which was ablated by cold  biopsy.  As the scope was withdrawn, the colonic mucosa was once again  carefully examined, and there were no other mucosa abnormalities.  The  rectal mucosa similarly was normal.  The scope was retroflexed to examine  the anorectal junction, and small hemorrhoids were noted below the dentate  line.  The  endoscope was straightened and withdrawn.  The patient tolerated  the procedure well.   FINAL DIAGNOSES:  1. Examination performed to the cecum.  2. Small cecal polyp that was ablated by cold biopsy.  3. Small external hemorrhoids.  Otherwise normal examination.   RECOMMENDATIONS:  1. Standard instructions given.  2. Citrucel or equivalent up to 4 g p.o. q.h.s.  3. Colace two tablets at bedtime.  4. I will contact the patient with the biopsy results and further     recommendations.      ___________________________________________                                            Lionel December, M.D.   NR/MEDQ  D:  07/12/2003  T:  07/12/2003  Job:  [id number]   cc:   Madelin Rear. Sherwood Gambler, M.D.  [address]  [place]  [place] [address]  Fax: [phone]

## 2010-12-12 NOTE — Op Note (Signed)
[facility]  Patient:    Bethany Grant, Bethany Grant                       MRN: [id number] Proc. Date: 12/30/99 Adm. Date:  [id number] Attending:  Ronne Binning CC:         Nicki Reaper, M.D. (2)                           Operative Report  PREOPERATIVE DIAGNOSIS:  Rheumatoid arthritis with synovitis of metacarpal phalangeal joint, left ring finger.  POSTOPERATIVE DIAGNOSIS:  Rheumatoid arthritis with synovitis of metacarpal phalangeal joint, left ring finger.  OPERATION:  Arthroscopic synovectomy, metacarpal phalangeal joint, left ring finger.  SURGEON:  Nicki Reaper, M.D.  ASSISTANT:  Joaquin Courts, R.N.  ANESTHESIA:  Axillary block.  ANESTHESIOLOGIST:  Maren Beach, M.D.  INDICATIONS:  The patient is a 70 year old female with a history of synovitis of the metacarpal phalangeal joint of her left ring finger, secondary to rheumatoid arthritis.  This has not responded to conservative treatment.  DESCRIPTION OF PROCEDURE:  The patient was brought to the operating room where n axillary block was carried out without difficulty.  She was prepped and draped using Betadine scrub and solution with the left arm free.  The limb was placed n the arthroscopy tower.  Traction to the ring finger, with 5 pounds of traction applied.  A #22 gauge needle was used to localize the joint.  On the radial aspect a stab incision was made transversely.  The hemostat was used to enter the joint. A blunt trocar was used to enter the joint.  The 1.9 mm scope was then introduced. An irrigation catheter was placed on the ulnar aspect of the extensor tendon using the #22 gauge needle.  The joint was inspected.  Very significant synovitis was  present.  A large erosion was present in the central aspect of the metacarpal head. An ArthoWand was then inserted, placing the irrigation catheter, a #22 gauge needle directly through the extensor tendon.  This was inserted  with the bevel longitudinal, so as to minimize any disruption of fibers.  The ArthroWand was then used to coagulate the synovitis radially and ulnarly.  The radial and ulnar gutters were well-visualized.  A 2.0 mm ENT shaver was then introduced and a complete synovectomy performed, taking care to remove the proximal portion dorsally and palmarly.  The collateral ligaments were intact.  The proximal aspect of the proximal phalanx, articular surface, was also intact.  The shaver and scope were alternated on both radial and ulnar portals.  A third debridement was done proximally.  The ArthroWand was then reinserted, and the capsule coagulated, to  remove any bleeding.  The joint was then injected with 0.25% Marcaine without epinephrine.  The portals were closed with interrupted #5-0 nylon suture.  A sterile compressive dressing and splint were applied.  The patient tolerated the procedure well and was taken to the recovery room for  observation, in satisfactory condition.  DISPOSITION:  She is discharged home, to return to the [facility] in one week, on Vicodin and Keflex. DD:  12/30/99 TD:  12/30/99 Job: [id number]

## 2010-12-12 NOTE — Op Note (Signed)
[facility] [place]  Patient:    Bethany Grant, Bethany Grant                         MRN: [id number] Proc. Date: 08/18/00 Attending:  Nicki Reaper, M.D.                           Operative Report  PREOPERATIVE DIAGNOSIS:  Rheumatoid arthritis with synovitis in metacarpophalangeal joint, left middle finger, subluxation extensor tendon, left middle finger.  POSTOPERATIVE DIAGNOSIS:  Rheumatoid arthritis with synovitis in metacarpophalangeal joint, left middle finger, subluxation extensor tendon, left middle finger.  PROCEDURES:  Synovectomy metacarpophalangeal joint, left middle finger; centralization extensor tendon.  SURGEON:  Nicki Reaper, M.D.  ASSISTANT:  Joaquin Courts, R.N.  ANESTHESIA:  Axillary block.  ANESTHESIOLOGIST:  Halford Decamp, M.D.  HISTORY:  The patient is a 70 year old female with a history of arthritis, swelling of the metacarpophalangeal joint, subluxation of the extensor tendon, left middle finger.  DESCRIPTION OF PROCEDURE:  The patient was brought to the operating room, where an axillary block was carried out without difficulty.  She was prepped and draped using Betadine scrub and solution with the left arm free.  The limb was exsanguinated with an Esmarch bandage, tourniquet was elevated to 250 mmHg.  Incision was made over the dorsal aspect of the metacarpophalangeal joint, carried down through subcutaneous tissue.  Bleeders were electrocauterized.  The extensor tendon was identified.  The subluxation was immediately apparent.  There was significant swelling in the metacarpophalangeal joint with significant stretching of the extensor hood on the radial side.  The extensor hood was incised on the radial side.  The capsule was then opened.  A synovectomy was performed with rongeurs and blunt and sharp dissection.  Significant synovial tissue was removed and sent to pathology.  The wound was irrigated, the capsule closed with  figure-of-eight 5-0 chromic sutures.  The extensor tendon was the centralized with a pants-over-vest manner and then sutured into position with figure-of-eight 5-0 Mersilene sutures.  Finger placed through a full range of motion and no further subluxation was identified.  The wound was irrigated.  The skin was closed with interrupted 5-0 nylon suture, and a sterile compressive dressing and splint was applied.  The patient tolerated the procedure well and was taken to the recovery room for observation in satisfactory condition.  She is discharged home, to return to the [facility] in one week, on Vicodin and Keflex. DD:  08/18/00 TD:  08/18/00 Job: [id number]

## 2011-01-21 ENCOUNTER — Other Ambulatory Visit (HOSPITAL_COMMUNITY)
Admission: RE | Admit: 2011-01-21 | Discharge: 2011-01-21 | Disposition: A | Discharge status: Home / Self Care | Payer: Medicare Other | Source: Ambulatory Visit | Attending: Obstetrics and Gynecology | Admitting: Obstetrics and Gynecology

## 2011-01-21 ENCOUNTER — Ambulatory Visit (INDEPENDENT_AMBULATORY_CARE_PROVIDER_SITE_OTHER)
Admission: RE | Admit: 2011-01-21 | Discharge: 2011-01-21 | Disposition: A | Discharge status: Home / Self Care | Payer: Medicare Other | Source: Ambulatory Visit | Attending: Pulmonary Disease | Admitting: Pulmonary Disease

## 2011-01-21 ENCOUNTER — Encounter: Payer: Self-pay | Admitting: Pulmonary Disease

## 2011-01-21 ENCOUNTER — Ambulatory Visit (INDEPENDENT_AMBULATORY_CARE_PROVIDER_SITE_OTHER): Payer: Medicare Other | Admitting: Pulmonary Disease

## 2011-01-21 ENCOUNTER — Other Ambulatory Visit: Payer: Self-pay | Admitting: Obstetrics and Gynecology

## 2011-01-21 ENCOUNTER — Encounter (INDEPENDENT_AMBULATORY_CARE_PROVIDER_SITE_OTHER): Payer: Medicare Other | Admitting: Obstetrics and Gynecology

## 2011-01-21 VITALS — BP 134/88 | HR 72 | Temp 98.2°F | Wt 177.2 lb

## 2011-01-21 DIAGNOSIS — B3731 Acute candidiasis of vulva and vagina: Secondary | ICD-10-CM

## 2011-01-21 DIAGNOSIS — R930 Abnormal findings on diagnostic imaging of skull and head, not elsewhere classified: Secondary | ICD-10-CM

## 2011-01-21 DIAGNOSIS — N9089 Other specified noninflammatory disorders of vulva and perineum: Secondary | ICD-10-CM

## 2011-01-21 DIAGNOSIS — N952 Postmenopausal atrophic vaginitis: Secondary | ICD-10-CM

## 2011-01-21 DIAGNOSIS — Z124 Encounter for screening for malignant neoplasm of cervix: Secondary | ICD-10-CM | POA: Insufficient documentation

## 2011-01-21 DIAGNOSIS — N951 Menopausal and female climacteric states: Secondary | ICD-10-CM

## 2011-01-21 DIAGNOSIS — B373 Candidiasis of vulva and vagina: Secondary | ICD-10-CM

## 2011-01-21 NOTE — Progress Notes (Signed)
Subjective:    Patient ID: Bethany Grant, female    DOB: [birth year], 70 y.o.   MRN: [id number]  HPI CC: Bethany Grant  70 yo female former smoker with abnormal chest xray in setting of rheumatoid arthritis, and family history of lung cancer.  She has not had any trouble with her breathing since last visit.  She denies cough, wheeze, sputum, fever, chest pain, or hemoptysis.  She was switched from methotrexate to arava 8 weeks ago for her arthritis.  She was having trouble with her blood counts from MTX.  CT chest 01/25/08>>4 mm Lt lower lobe nodule, hazy reticular infiltrate LLL CT chest 04/18/08>>increase in LLL reticular infiltrate Bronchoscopy Oct 2009>>Pneumatocyte atypia suggestive of obstructive pneumonitis, can be seen with RA in the lungs PFT 06/19/08>>FEV1 2.58(107%), FEV1% 71, TLC 6.07(111%), DLCO 96%, no BD CT chest 05/23/09>>3.8 x 3.6 cm ascending aorta, scoliosis, decreased size LLL nodule, reticular changes LLL decreased  Past Medical History  Diagnosis Date  . Endometriosis   . Fibroid   . DUB (dysfunctional uterine bleeding)   . Rheumatoid arthritis   . Hypercholesterolemia      Family History  Problem Relation Age of Onset  . Cancer Mother     LUNG  . Hypertension Sister   . Cancer Sister     LUNG, THYROID, UTERINE  . Cancer Brother     LUNG     History   Social History  . Marital Status: Married    Spouse Name: N/A    Number of Children: N/A  . Years of Education: N/A   Occupational History  . Not on file.   Social History Main Topics  . Smoking status: Former Smoker -- 0.8 packs/day for 30 years    Types: Cigarettes    Quit date: 07/27/2004  . Smokeless tobacco: Never Used  . Alcohol Use: No  . Drug Use: Not on file  . Sexually Active: Not on file   Other Topics Concern  . Not on file   Social History Narrative  . No narrative on file     No Known Allergies   Outpatient Prescriptions Prior to Visit  Medication Sig  Dispense Refill  . ALPRAZolam (XANAX) 0.5 MG tablet Take 0.5 mg by mouth at bedtime as needed.        . cycloSPORINE (RESTASIS) 0.05 % ophthalmic emulsion Place 1 drop into both eyes daily.       . rosuvastatin (CRESTOR) 10 MG tablet Take 10 mg by mouth daily.        . celecoxib (CELEBREX) 100 MG capsule Take 100 mg by mouth daily.        . Cholecalciferol (VITAMIN D PO) Take 600 mg by mouth.        Marland Kitchen GLUCOSAMINE CHONDROITIN COMPLX PO Take by mouth.        . methotrexate (RHEUMATREX) 2.5 MG tablet Take 7.5 mg by mouth 3 (three) times a week.         Review of Systems     Objective:   Physical Exam  BP 134/88  Pulse 72  Temp(Src) 98.2 F (36.8 C) (Oral)  Wt 177 lb 3.2 oz (80.377 kg)  SpO2 98%  General: normal appearance and healthy appearing.  Nose: clear nasal discharge.  Mouth: no oral lesion  Neck: no JVD  Lungs: Decreased breath sounds, good air entry, no wheezing or rales  Heart: regular rhythm, normal rate, and no murmurs.  Extremities: no edema or cyanosis  Neurologic: normal  CN II-XII and strength normal.  Cervical Nodes: no significant adenopathy  Psych: alert and cooperative; normal mood and affect; normal attention span and concentration     CHEST - 2 VIEW 01/21/11:  Comparison: 08/27/2010.  Findings: Trachea is midline. Heart size stable. Thoracic aorta is calcified. Biapical pleural parenchymal scarring. Lungs are clear. No pleural fluid. S-shaped scoliosis.  IMPRESSION: No acute findings.  Assessment & Plan:   ABNORMAL CHEST XRAY She did not have any acute findings on exam or chest xray today.  Her original findings from 2009 were likely related to rheumatoid arthritis in her lungs which has resolved with therapy for her RA.  She has a prior history of smoking, and family history of lung cancer.  She would like to continue radiographic follow up.  Will arrange for chest xray in one year.    Updated Medication List Outpatient Encounter Prescriptions as  of 01/21/2011  Medication Sig Dispense Refill  . ALPRAZolam (XANAX) 0.5 MG tablet Take 0.5 mg by mouth at bedtime as needed.        . Calcium Carbonate-Vitamin D (CALCIUM + D PO) Take by mouth daily.        . cycloSPORINE (RESTASIS) 0.05 % ophthalmic emulsion Place 1 drop into both eyes daily.       Marland Kitchen leflunomide (ARAVA) 20 MG tablet Take 20 mg by mouth daily.        . predniSONE (DELTASONE) 10 MG tablet 10 mg. As directed (currently on 5mg  daily 01-21-11)       . rosuvastatin (CRESTOR) 10 MG tablet Take 10 mg by mouth daily.        Marland Kitchen DISCONTD: celecoxib (CELEBREX) 100 MG capsule Take 100 mg by mouth daily.        Marland Kitchen DISCONTD: Cholecalciferol (VITAMIN D PO) Take 600 mg by mouth.        . DISCONTD: GLUCOSAMINE CHONDROITIN COMPLX PO Take by mouth.        . DISCONTD: methotrexate (RHEUMATREX) 2.5 MG tablet Take 7.5 mg by mouth 3 (three) times a week.

## 2011-01-21 NOTE — Patient Instructions (Signed)
Follow up in one year with chest xray

## 2011-01-21 NOTE — Assessment & Plan Note (Signed)
She did not have any acute findings on exam or chest xray today.  Her original findings from 2009 were likely related to rheumatoid arthritis in her lungs which has resolved with therapy for her RA.  She has a prior history of smoking, and family history of lung cancer.  She would like to continue radiographic follow up.  Will arrange for chest xray in one year.

## 2011-02-02 ENCOUNTER — Other Ambulatory Visit: Payer: Self-pay | Admitting: Obstetrics and Gynecology

## 2011-02-02 DIAGNOSIS — Z139 Encounter for screening, unspecified: Secondary | ICD-10-CM

## 2011-02-25 ENCOUNTER — Ambulatory Visit
Admission: RE | Admit: 2011-02-25 | Discharge: 2011-02-25 | Disposition: A | Discharge status: Home / Self Care | Payer: Medicare Other | Source: Ambulatory Visit | Attending: Rheumatology | Admitting: Rheumatology

## 2011-02-25 ENCOUNTER — Other Ambulatory Visit: Payer: Self-pay | Admitting: Rheumatology

## 2011-02-25 DIAGNOSIS — M542 Cervicalgia: Secondary | ICD-10-CM

## 2011-03-02 ENCOUNTER — Ambulatory Visit (HOSPITAL_COMMUNITY)
Admission: RE | Admit: 2011-03-02 | Discharge: 2011-03-02 | Disposition: A | Discharge status: Home / Self Care | Payer: Medicare Other | Source: Ambulatory Visit | Attending: Obstetrics and Gynecology | Admitting: Obstetrics and Gynecology

## 2011-03-02 DIAGNOSIS — Z1231 Encounter for screening mammogram for malignant neoplasm of breast: Secondary | ICD-10-CM | POA: Insufficient documentation

## 2011-03-02 DIAGNOSIS — Z139 Encounter for screening, unspecified: Secondary | ICD-10-CM

## 2011-03-23 ENCOUNTER — Other Ambulatory Visit: Payer: Self-pay | Admitting: *Deleted

## 2011-03-23 DIAGNOSIS — Z1211 Encounter for screening for malignant neoplasm of colon: Secondary | ICD-10-CM

## 2011-03-23 LAB — POC HEMOCCULT BLD/STL (OFFICE/1-CARD/DIAGNOSTIC): Fecal Occult Blood, POC: NEGATIVE

## 2011-04-03 ENCOUNTER — Other Ambulatory Visit: Payer: Self-pay | Admitting: Rheumatology

## 2011-04-03 DIAGNOSIS — M542 Cervicalgia: Secondary | ICD-10-CM

## 2011-04-03 DIAGNOSIS — G8929 Other chronic pain: Secondary | ICD-10-CM

## 2011-04-08 ENCOUNTER — Ambulatory Visit
Admission: RE | Admit: 2011-04-08 | Discharge: 2011-04-08 | Disposition: A | Discharge status: Home / Self Care | Payer: 59 | Source: Ambulatory Visit | Attending: Rheumatology | Admitting: Rheumatology

## 2011-04-08 DIAGNOSIS — M542 Cervicalgia: Secondary | ICD-10-CM

## 2011-04-08 DIAGNOSIS — G8929 Other chronic pain: Secondary | ICD-10-CM

## 2011-04-28 LAB — CULTURE, RESPIRATORY W GRAM STAIN

## 2011-04-28 LAB — LEGIONELLA PROFILE(CULTURE+DFA/SMEAR)

## 2011-04-28 LAB — FUNGUS CULTURE W SMEAR

## 2011-04-28 LAB — AFB CULTURE WITH SMEAR (NOT AT ARMC): Acid Fast Smear: NONE SEEN

## 2011-04-28 LAB — P CARINII SMEAR DFA: Pneumocystis carinii DFA: NEGATIVE

## 2011-05-29 ENCOUNTER — Other Ambulatory Visit (HOSPITAL_COMMUNITY): Payer: Self-pay | Admitting: Internal Medicine

## 2011-05-29 DIAGNOSIS — R0989 Other specified symptoms and signs involving the circulatory and respiratory systems: Secondary | ICD-10-CM

## 2011-06-01 ENCOUNTER — Other Ambulatory Visit (HOSPITAL_COMMUNITY): Payer: Self-pay | Admitting: Internal Medicine

## 2011-06-01 ENCOUNTER — Ambulatory Visit (HOSPITAL_COMMUNITY)
Admission: RE | Admit: 2011-06-01 | Discharge: 2011-06-01 | Disposition: A | Discharge status: Home / Self Care | Payer: Medicare Other | Source: Ambulatory Visit | Attending: Internal Medicine | Admitting: Internal Medicine

## 2011-06-01 DIAGNOSIS — R0989 Other specified symptoms and signs involving the circulatory and respiratory systems: Secondary | ICD-10-CM

## 2011-06-01 DIAGNOSIS — I10 Essential (primary) hypertension: Secondary | ICD-10-CM | POA: Insufficient documentation

## 2012-01-25 ENCOUNTER — Ambulatory Visit (INDEPENDENT_AMBULATORY_CARE_PROVIDER_SITE_OTHER)
Admission: RE | Admit: 2012-01-25 | Discharge: 2012-01-25 | Disposition: A | Discharge status: Home / Self Care | Payer: Medicare Other | Source: Ambulatory Visit | Attending: Pulmonary Disease | Admitting: Pulmonary Disease

## 2012-01-25 ENCOUNTER — Ambulatory Visit (INDEPENDENT_AMBULATORY_CARE_PROVIDER_SITE_OTHER): Payer: Medicare Other | Admitting: Pulmonary Disease

## 2012-01-25 ENCOUNTER — Encounter: Payer: Self-pay | Admitting: Pulmonary Disease

## 2012-01-25 VITALS — BP 122/56 | HR 73 | Temp 98.4°F | Ht 67.0 in | Wt 164.0 lb

## 2012-01-25 DIAGNOSIS — R930 Abnormal findings on diagnostic imaging of skull and head, not elsewhere classified: Secondary | ICD-10-CM

## 2012-01-25 DIAGNOSIS — M069 Rheumatoid arthritis, unspecified: Secondary | ICD-10-CM

## 2012-01-25 DIAGNOSIS — J310 Chronic rhinitis: Secondary | ICD-10-CM

## 2012-01-25 MED ORDER — MOMETASONE FUROATE 50 MCG/ACT NA SUSP: 2.0000 | Freq: Every day | NASAL | Status: DC

## 2012-01-25 NOTE — Assessment & Plan Note (Signed)
She did not have any acute findings on exam or chest xray today.  Her original findings from 2009 were likely related to rheumatoid arthritis in her lungs which has resolved with therapy for her RA.  She has a prior history of smoking, and family history of lung cancer.  She would like to continue radiographic follow up.  Will arrange for chest xray in one year. 

## 2012-01-25 NOTE — Assessment & Plan Note (Signed)
He current symptoms are likely related to allergic rhinitis with post-nasal drip.  Will have her use nasal irrigation and nasonex for next few weeks until her symptoms improve, then as needed.

## 2012-01-25 NOTE — Progress Notes (Signed)
Chief Complaint  Patient presents with  . 1 yr follow up    Pt reports congestion, feels like mucus is in throat and when coughed up is clear,    CC: [name]  History of Present Illness: Bethany Grant is a 71 y.o. female former smoker with abnormal chest xray in setting of rheumatoid arthritis, and family history of lung cancer.  She was doing well until recent weather change.  She noticed more sinus congestion, post-nasal drip, and cough with clear sputum.  She denies fever, chest pain, dysphagia, hemoptysis, dyspnea, wheezing, or gland swelling.   Past Medical History  Diagnosis Date  . Endometriosis   . Fibroid   . DUB (dysfunctional uterine bleeding)   . Rheumatoid arthritis   . Hypercholesterolemia     Past Surgical History  Procedure Date  . Abdominal hysterectomy 1991    TAH/BSO  . Hemorrhoid surgery   . Hand surgery   . Foot surgery     2 TIMES  . Breast surgery 1988    BREAST CYST  . Bartholin gland cyst excision 1980    RIGHT    Outpatient Encounter Prescriptions as of 01/25/2012  Medication Sig Dispense Refill  . ALPRAZolam (XANAX) 0.5 MG tablet Take 0.5 mg by mouth at bedtime as needed.        . Calcium Carbonate-Vitamin D (CALCIUM + D PO) Take by mouth daily as needed.       . cycloSPORINE (RESTASIS) 0.05 % ophthalmic emulsion Place 1 drop into both eyes daily.       . folic acid (FOLVITE) 1 MG tablet Take 1 mg by mouth daily.      . hydroxychloroquine (PLAQUENIL) 200 MG tablet Take 200 mg by mouth 2 (two) times daily. Pt prescribed this dosage, but currently taking one pill unit eye appt      . losartan-hydrochlorothiazide (HYZAAR) 100-12.5 MG per tablet Take 1 tablet by mouth daily.      . methotrexate (RHEUMATREX) 2.5 MG tablet Take 2.5 mg by mouth once a week. Caution:Chemotherapy. Protect from light. 4 pills Saturday, 4 pills Sunday      . predniSONE (DELTASONE) 10 MG tablet 5 mg. As directed (currently on 5mg  daily 01-21-11)      .  rosuvastatin (CRESTOR) 10 MG tablet Take 10 mg by mouth daily.        . mometasone (NASONEX) 50 MCG/ACT nasal spray Place 2 sprays into the nose daily.  17 g  2  . DISCONTD: leflunomide (ARAVA) 20 MG tablet Take 20 mg by mouth daily.          No Known Allergies  Physical Exam:  Blood pressure 122/56, pulse 73, temperature 98.4 F (36.9 C), temperature source Oral, height 5\' 7"  (1.702 m), weight 164 lb (74.39 kg), SpO2 96.00%. Body mass index is 25.69 kg/(m^2). Wt Readings from Last 2 Encounters:  01/25/12 164 lb (74.39 kg)  01/21/11 177 lb 3.2 oz (80.377 kg)    General: normal appearance and healthy appearing.  Nose: clear nasal discharge.  Mouth: no oral lesion  Neck: no JVD  Lungs: Decreased breath sounds, good air entry, no wheezing or rales  Heart: regular rhythm, normal rate, and no murmurs.  Extremities: no edema or cyanosis  Neurologic: normal CN II-XII and strength normal.  Cervical Nodes: no significant adenopathy  Psych: alert and cooperative; normal mood and affect; normal attention span and concentration  Dg Chest 2 View  01/25/2012  *RADIOLOGY REPORT*  Clinical Data: Physical exam, history  hypertension, rheumatoid arthritis, and smoking  CHEST - 2 VIEW  Comparison: 01/21/2011  Findings: Normal heart size and pulmonary vascularity. Atherosclerotic calcification aorta. Biconvex thoracic scoliosis. Emphysematous and bronchitic changes consistent with COPD. Minimal scattered interstitial prominence stable. No pulmonary infiltrate, pleural effusion, or pneumothorax. No acute osseous findings.  IMPRESSION: COPD. Scoliosis. No acute abnormalities.  Original Report Authenticated By: Lollie Marrow, M.D.      Assessment/Plan:  Coralyn Helling, MD [facility] Pager:  [phone] 01/25/2012, 5:31 PM

## 2012-01-25 NOTE — Assessment & Plan Note (Signed)
She is followed by rheumatology for this.

## 2012-01-25 NOTE — Patient Instructions (Signed)
Nasal irrigation (saline sinus rinse) daily before using nasonex Nasonex two sprays in each nostril daily until sinus symptoms improved, then as needed Follow up in one year with chest xray

## 2012-01-27 ENCOUNTER — Encounter: Payer: Self-pay | Admitting: Obstetrics and Gynecology

## 2012-01-27 ENCOUNTER — Ambulatory Visit (INDEPENDENT_AMBULATORY_CARE_PROVIDER_SITE_OTHER): Payer: Medicare Other | Admitting: Obstetrics and Gynecology

## 2012-01-27 VITALS — BP 120/76 | Ht 67.0 in | Wt 161.0 lb

## 2012-01-27 DIAGNOSIS — I10 Essential (primary) hypertension: Secondary | ICD-10-CM | POA: Insufficient documentation

## 2012-01-27 DIAGNOSIS — R350 Frequency of micturition: Secondary | ICD-10-CM

## 2012-01-27 NOTE — Progress Notes (Signed)
Patient came to see me today for further followup. One of the issues we have been watching with her is nocturia with some urgency. She does not have incontinence of urine. She does not have dysuria. She does not have hematuria. We have discussed medication but she continues to feel that the problem was not serious enough to want it. She does have an epidermoid cyst on her left labia that we've been watching. She does not feel it has changed. It is not bother her enough for removal. She does have significant vaginal dryness. She is rarely  sexually active and does not want to treat it. She is status post TAH, BSO done for fibroids and endometriosis. Many years ago she had: koilocytic  atypia on Pap but has had normal Paps for greater than 20 years. She had a normal Pap last year. She has no vaginal bleeding. She has no pelvic pain. She sees a pulmonologist regularly because of her strong family history of lung cancer. She had a normal bone density in office in 2009. She thinks she's had previous normal bone densities as well.  ROS: 12 system review done. Pertinent positives above. Other positives include rheumatoid arthritis and hypertension.  HEENT: Within normal limits. Kennon Portela present. Neck: No masses. Supraclavicular lymph nodes: Not enlarged. Breasts: Examined in both sitting and lying position. Symmetrical without skin changes or masses. Abdomen: Soft no masses guarding or rebound. No hernias. Pelvic: External within normal limits except for stable firm epidermoid cyst of left labia of 2 x 1 cm. BUS within normal limits. Vaginal examination shows good estrogen effect, no cystocele enterocele or rectocele. Cervix and uterus absent. Adnexa within normal limits. Rectovaginal confirmatory. Extremities within normal limits.  Assessment: #1. Atrophic vaginitis #2. Epidermoid cyst left labia #3. Detrussor instability  Plan: Continued observation of all the above. Mammogram.The new Pap smear  guidelines were discussed with the patient. No pap done. We discussed when she should come again. She knows I'm retiring. We discussed things that are done at a normal GYN exam in someone who has had a TAH/BSO. She can either do them here over for other physicians. She will decide. She knows also to return for problems.

## 2012-01-28 LAB — URINALYSIS W MICROSCOPIC + REFLEX CULTURE
Bacteria, UA: NONE SEEN
Bilirubin Urine: NEGATIVE
Casts: NONE SEEN
Crystals: NONE SEEN
Glucose, UA: NEGATIVE mg/dL
Ketones, ur: NEGATIVE mg/dL
Specific Gravity, Urine: 1.009 (ref 1.005–1.030)
Urobilinogen, UA: 0.2 mg/dL (ref 0.0–1.0)

## 2012-02-08 ENCOUNTER — Other Ambulatory Visit (HOSPITAL_COMMUNITY): Payer: Self-pay | Admitting: Internal Medicine

## 2012-02-08 DIAGNOSIS — Z139 Encounter for screening, unspecified: Secondary | ICD-10-CM

## 2012-03-03 ENCOUNTER — Ambulatory Visit (HOSPITAL_COMMUNITY): Payer: Medicare Other

## 2012-03-03 ENCOUNTER — Ambulatory Visit (HOSPITAL_COMMUNITY)
Admission: RE | Admit: 2012-03-03 | Discharge: 2012-03-03 | Disposition: A | Discharge status: Home / Self Care | Payer: 59 | Source: Ambulatory Visit | Attending: Internal Medicine | Admitting: Internal Medicine

## 2012-03-03 DIAGNOSIS — Z139 Encounter for screening, unspecified: Secondary | ICD-10-CM

## 2012-03-03 DIAGNOSIS — Z1231 Encounter for screening mammogram for malignant neoplasm of breast: Secondary | ICD-10-CM | POA: Insufficient documentation

## 2012-12-01 ENCOUNTER — Other Ambulatory Visit (HOSPITAL_COMMUNITY): Payer: Self-pay | Admitting: Internal Medicine

## 2012-12-01 DIAGNOSIS — IMO0001 Reserved for inherently not codable concepts without codable children: Secondary | ICD-10-CM

## 2012-12-06 ENCOUNTER — Ambulatory Visit (HOSPITAL_COMMUNITY): Payer: Medicare Other

## 2012-12-07 ENCOUNTER — Ambulatory Visit (HOSPITAL_COMMUNITY)
Admission: RE | Admit: 2012-12-07 | Discharge: 2012-12-07 | Disposition: A | Discharge status: Home / Self Care | Payer: Medicare Other | Source: Ambulatory Visit | Attending: Internal Medicine | Admitting: Internal Medicine

## 2012-12-07 DIAGNOSIS — I6529 Occlusion and stenosis of unspecified carotid artery: Secondary | ICD-10-CM | POA: Insufficient documentation

## 2012-12-07 DIAGNOSIS — IMO0001 Reserved for inherently not codable concepts without codable children: Secondary | ICD-10-CM

## 2013-02-03 ENCOUNTER — Other Ambulatory Visit (HOSPITAL_COMMUNITY): Payer: Self-pay | Admitting: Rheumatology

## 2013-02-03 DIAGNOSIS — Z79899 Other long term (current) drug therapy: Secondary | ICD-10-CM

## 2013-02-03 DIAGNOSIS — R7989 Other specified abnormal findings of blood chemistry: Secondary | ICD-10-CM

## 2013-02-03 DIAGNOSIS — Z79631 Long term (current) use of antimetabolite agent: Secondary | ICD-10-CM

## 2013-02-03 DIAGNOSIS — M069 Rheumatoid arthritis, unspecified: Secondary | ICD-10-CM

## 2013-02-10 ENCOUNTER — Other Ambulatory Visit: Payer: Self-pay | Admitting: Radiology

## 2013-02-10 ENCOUNTER — Encounter (HOSPITAL_COMMUNITY): Payer: Self-pay | Admitting: Pharmacy Technician

## 2013-02-16 ENCOUNTER — Encounter (HOSPITAL_COMMUNITY): Payer: Self-pay

## 2013-02-16 ENCOUNTER — Ambulatory Visit (HOSPITAL_COMMUNITY)
Admission: RE | Admit: 2013-02-16 | Discharge: 2013-02-16 | Disposition: A | Discharge status: Home / Self Care | Payer: Medicare Other | Source: Ambulatory Visit | Attending: Rheumatology | Admitting: Rheumatology

## 2013-02-16 ENCOUNTER — Other Ambulatory Visit (HOSPITAL_COMMUNITY): Payer: Self-pay | Admitting: Rheumatology

## 2013-02-16 DIAGNOSIS — Z79899 Other long term (current) drug therapy: Secondary | ICD-10-CM | POA: Insufficient documentation

## 2013-02-16 DIAGNOSIS — Z79631 Long term (current) use of antimetabolite agent: Secondary | ICD-10-CM

## 2013-02-16 DIAGNOSIS — R7401 Elevation of levels of liver transaminase levels: Secondary | ICD-10-CM | POA: Insufficient documentation

## 2013-02-16 DIAGNOSIS — M069 Rheumatoid arthritis, unspecified: Secondary | ICD-10-CM | POA: Insufficient documentation

## 2013-02-16 DIAGNOSIS — Z01812 Encounter for preprocedural laboratory examination: Secondary | ICD-10-CM | POA: Insufficient documentation

## 2013-02-16 DIAGNOSIS — I10 Essential (primary) hypertension: Secondary | ICD-10-CM | POA: Insufficient documentation

## 2013-02-16 DIAGNOSIS — R7402 Elevation of levels of lactic acid dehydrogenase (LDH): Secondary | ICD-10-CM | POA: Insufficient documentation

## 2013-02-16 DIAGNOSIS — IMO0002 Reserved for concepts with insufficient information to code with codable children: Secondary | ICD-10-CM | POA: Insufficient documentation

## 2013-02-16 DIAGNOSIS — R7989 Other specified abnormal findings of blood chemistry: Secondary | ICD-10-CM

## 2013-02-16 DIAGNOSIS — E7800 Pure hypercholesterolemia, unspecified: Secondary | ICD-10-CM | POA: Insufficient documentation

## 2013-02-16 DIAGNOSIS — Z87891 Personal history of nicotine dependence: Secondary | ICD-10-CM | POA: Insufficient documentation

## 2013-02-16 LAB — PROTIME-INR
INR: 0.98 (ref 0.00–1.49)
Prothrombin Time: 12.8 seconds (ref 11.6–15.2)

## 2013-02-16 LAB — CBC
Hemoglobin: 12.7 g/dL (ref 12.0–15.0)
MCHC: 31.8 g/dL (ref 30.0–36.0)
Platelets: 167 10*3/uL (ref 150–400)
RBC: 4.5 MIL/uL (ref 3.87–5.11)

## 2013-02-16 LAB — APTT: aPTT: 30 seconds (ref 24–37)

## 2013-02-16 MED ORDER — FENTANYL CITRATE 0.05 MG/ML IJ SOLN
INTRAMUSCULAR | Status: AC | PRN
  Administered 2013-02-16 (×2): 50 ug via INTRAVENOUS

## 2013-02-16 MED ORDER — MIDAZOLAM HCL 2 MG/2ML IJ SOLN
INTRAMUSCULAR | Status: AC
  Filled 2013-02-16: qty 4

## 2013-02-16 MED ORDER — SODIUM CHLORIDE 0.9 % IV SOLN
Freq: Once | INTRAVENOUS | Status: AC
  Administered 2013-02-16: 09:00:00 via INTRAVENOUS

## 2013-02-16 MED ORDER — MIDAZOLAM HCL 2 MG/2ML IJ SOLN
INTRAMUSCULAR | Status: AC | PRN
  Administered 2013-02-16 (×4): 1 mg via INTRAVENOUS

## 2013-02-16 MED ORDER — FENTANYL CITRATE 0.05 MG/ML IJ SOLN
INTRAMUSCULAR | Status: AC
  Filled 2013-02-16: qty 4

## 2013-02-16 MED ORDER — ONDANSETRON HCL 4 MG/2ML IJ SOLN
4.0000 mg | Freq: Once | INTRAMUSCULAR | Status: AC
  Administered 2013-02-16: 4 mg via INTRAVENOUS

## 2013-02-16 MED ORDER — ONDANSETRON HCL 4 MG/2ML IJ SOLN
INTRAMUSCULAR | Status: AC
  Filled 2013-02-16: qty 2

## 2013-02-16 NOTE — H&P (Signed)
Chief Complaint: "I am here for a liver biopsy." Referring Physician: Dr. Kellie Simmering HPI: Bethany Grant is an 72 y.o. female with PMHx of RA and has been on methotrexate. Dr. Kellie Simmering has requested a liver biopsy for elevated LFT's 5x within 1month period while on methotrexate. Patient denies any RUQ pain on interview, denies any abnormal bleeding, blood in her stool or urine. She denies any recent illness, fever or chills. She denies any chest pain or shortness of breath. She does c/o left shoulder and arm discomfort from her RA.  Past Medical History:  Past Medical History  Diagnosis Date  . Endometriosis   . Fibroid   . DUB (dysfunctional uterine bleeding)   . Rheumatoid arthritis(714.0)   . Hypercholesterolemia   . Hypertension     Past Surgical History:  Past Surgical History  Procedure Laterality Date  . Abdominal hysterectomy  1991    TAH/BSO  . Hemorrhoid surgery    . Hand surgery    . Foot surgery      2 TIMES  . Breast surgery  1988    BREAST CYST  . Bartholin gland cyst excision  1980    RIGHT  . Oophorectomy      BSO    Family History:  Family History  Problem Relation Age of Onset  . Cancer Mother     LUNG  . Hypertension Sister   . Cancer Sister     LUNG, THYROID, UTERINE  . Cancer Brother     LUNG  . Cancer Sister     Lung cancer    Social History:  reports that she quit smoking about 8 years ago. Her smoking use included Cigarettes. She has a 24 pack-year smoking history. She has never used smokeless tobacco. She reports that she does not drink alcohol or use illicit drugs.  Allergies:  Allergies  Allergen Reactions  . Other     Doesn't do well with pain medications      Medication List    ASK your doctor about these medications       ALPRAZolam 0.25 MG tablet  Commonly known as:  XANAX  Take 0.25 mg by mouth at bedtime as needed for sleep.     cycloSPORINE 0.05 % ophthalmic emulsion  Commonly known as:  RESTASIS  Place 1 drop into both  eyes daily.     hydroxychloroquine 200 MG tablet  Commonly known as:  PLAQUENIL  Take 200 mg by mouth daily. Pt prescribed this dosage, but currently taking one pill unit eye appt     losartan-hydrochlorothiazide 100-12.5 MG per tablet  Commonly known as:  HYZAAR  Take 1 tablet by mouth daily.     methotrexate 2.5 MG tablet  Commonly known as:  RHEUMATREX  - Take 2.5 mg by mouth 2 (two) times a week. Caution:Chemotherapy. Protect from light.  - 4 pills Saturday, 4 pills Sunday     NAPROXEN PO  Take 1 tablet by mouth daily. OTC     predniSONE 10 MG tablet  Commonly known as:  DELTASONE  5 mg. As directed (currently on 5mg  daily 01-21-11)     predniSONE 5 MG tablet  Commonly known as:  DELTASONE  Take 5 mg by mouth daily.        Please HPI for pertinent positives, otherwise complete 10 system ROS negative.  Physical Exam: BP 138/56  Pulse 61  Temp(Src) 97.5 F (36.4 C) (Oral)  Resp 20  Ht 5\' 7"  (1.702 m)  Wt 162 lb (  73.483 kg)  BMI 25.37 kg/m2  SpO2 99% Body mass index is 25.37 kg/(m^2).   General Appearance:  Alert, cooperative, no distress, appears stated age  Head:  Normocephalic, without obvious abnormality, atraumatic  Neck: Supple, symmetrical, trachea midline  Lungs:   Clear to auscultation bilaterally, no w/r/r, respirations unlabored without use of accessory muscles.  Chest Wall:  No tenderness or deformity  Heart:  Regular rate and rhythm, S1, S2 normal, no murmur, rub or gallop.  Abdomen:   Soft, non-tender, non distended.  Extremities: Extremities RA changes with decreased ROM, atraumatic, no cyanosis or edema  Pulses: 2+ and symmetric  Neurologic: Normal affect, no gross deficits.   No results found for this or any previous visit (from the past 48 hour(s)). No results found.  Assessment/Plan Patient with elevated LFT's 5x within 12 month period while on methotrexate.  Rheumatoid arthritis.  Scheduled for US guided liver biopsy today requested by  Dr. Kellie Simmering. Risks and Benefits discussed with the patient and her husband. All of the patient's questions were answered, patient is agreeable to proceed. Consent signed and in chart.   Pattricia Boss D PA-C 02/16/2013, 9:17 AM

## 2013-02-16 NOTE — Procedures (Signed)
Successful random liver core biopsy. No immediate complications.  Pattricia Boss PA-C Interventional Radiology  02/16/2013 12:13 pm

## 2013-02-27 ENCOUNTER — Other Ambulatory Visit (HOSPITAL_COMMUNITY): Payer: Self-pay | Admitting: Internal Medicine

## 2013-02-27 DIAGNOSIS — Z139 Encounter for screening, unspecified: Secondary | ICD-10-CM

## 2013-03-06 ENCOUNTER — Ambulatory Visit (HOSPITAL_COMMUNITY)
Admission: RE | Admit: 2013-03-06 | Discharge: 2013-03-06 | Disposition: A | Discharge status: Home / Self Care | Payer: Medicare Other | Source: Ambulatory Visit | Attending: Internal Medicine | Admitting: Internal Medicine

## 2013-03-06 DIAGNOSIS — Z1231 Encounter for screening mammogram for malignant neoplasm of breast: Secondary | ICD-10-CM | POA: Insufficient documentation

## 2013-03-06 DIAGNOSIS — Z139 Encounter for screening, unspecified: Secondary | ICD-10-CM

## 2013-04-06 ENCOUNTER — Ambulatory Visit (INDEPENDENT_AMBULATORY_CARE_PROVIDER_SITE_OTHER): Payer: Medicare Other | Admitting: Gynecology

## 2013-04-06 ENCOUNTER — Encounter: Payer: Self-pay | Admitting: Gynecology

## 2013-04-06 ENCOUNTER — Telehealth: Payer: Self-pay | Admitting: *Deleted

## 2013-04-06 VITALS — BP 110/68 | Ht 66.5 in | Wt 168.3 lb

## 2013-04-06 DIAGNOSIS — N318 Other neuromuscular dysfunction of bladder: Secondary | ICD-10-CM

## 2013-04-06 DIAGNOSIS — N3281 Overactive bladder: Secondary | ICD-10-CM

## 2013-04-06 DIAGNOSIS — N952 Postmenopausal atrophic vaginitis: Secondary | ICD-10-CM

## 2013-04-06 DIAGNOSIS — Z780 Asymptomatic menopausal state: Secondary | ICD-10-CM

## 2013-04-06 DIAGNOSIS — R351 Nocturia: Secondary | ICD-10-CM

## 2013-04-06 MED ORDER — NONFORMULARY OR COMPOUNDED ITEM: Status: DC

## 2013-04-06 NOTE — Telephone Encounter (Signed)
Pt was seen today and never received her stool kit specimen, this will be mailed to patient home per request. Her last colonscopy was in Dec. 16 2004. Pt couldn't remember the date at OV.

## 2013-04-06 NOTE — Progress Notes (Signed)
Bethany Grant 02-20-1941 [id number]   History:    72 y.o.  For GYN exam who was complaining of vaginal dryness and irritation. Patient also has had history of nocturia and urgency but no incontinence reported. She denies any dysuria. She denies any hematuria. We have discussed medication but she continues to feel that the problem was not serious enough to want it. She does have an epidermoid cyst on her left labia that we've been watching. She does not feel it has changed. It is not bother her enough for removal. She is status post TAH, BSO done for fibroids and endometriosis. Many years ago she had: koilocytic atypia on Pap but has had normal Paps for greater than 20 years. She had a normal Pap last year. She has no vaginal bleeding. She has no pelvic pain. She sees a pulmonologist regularly because of her strong family history of lung cancer and her severe history of rheumatoid arthritis. Her last bone density study was done in office in 2009 which was normal. She is currently not taking calcium and vitamin D. Her colonoscopy was less than 10 years ago. She is not receive the shingles vaccine yet. Dr. Margo Aye in [place], [place]. History primary physician and has been doing her lab work.    Past medical history,surgical history, family history and social history were all reviewed and documented in the EPIC chart.  Gynecologic History No LMP recorded. Patient has had a hysterectomy. Contraception: status post hysterectomy Last Pap: 2012. Results were: normal Last mammogram: 2014. Results were: normal  Obstetric History OB History  Gravida Para Term Preterm AB SAB TAB Ectopic Multiple Living  3 3 3       3     # Outcome Date GA Lbr Len/2nd Weight Sex Delivery Anes PTL Lv  3 TRM           2 TRM           1 TRM                ROS: A ROS was performed and pertinent positives and negatives are included in the history.  GENERAL: No fevers or chills. HEENT: No change in vision, no earache, sore  throat or sinus congestion. NECK: No pain or stiffness. CARDIOVASCULAR: No chest pain or pressure. No palpitations. PULMONARY: No shortness of breath, cough or wheeze. GASTROINTESTINAL: No abdominal pain, nausea, vomiting or diarrhea, melena or bright red blood per rectum. GENITOURINARY: No urinary frequency, urgency, hesitancy or dysuria. MUSCULOSKELETAL: No joint or muscle pain, no back pain, no recent trauma. DERMATOLOGIC:vaginal atrophyENDOCRINE: No polyuria, polydipsia, no heat or cold intolerance. No recent change in weight. HEMATOLOGICAL: No anemia or easy bruising or bleeding. NEUROLOGIC: No headache, seizures, numbness, tingling or weakness. PSYCHIATRIC: No depression, no loss of interest in normal activity or change in sleep pattern.     Exam: chaperone present  BP 110/68  Ht 5' 6.5" (1.689 m)  Wt 168 lb 4.8 oz (76.34 kg)  BMI 26.76 kg/m2  Body mass index is 26.76 kg/(m^2).  General appearance : Well developed well nourished female. No acute distress HEENT: Neck supple, trachea midline, no carotid bruits, no thyroidmegaly Lungs: Clear to auscultation, no rhonchi or wheezes, or rib retractions  Heart: Regular rate and rhythm, no murmurs or gallops Breast:Examined in sitting and supine position were symmetrical in appearance, no palpable masses or tenderness,  no skin retraction, no nipple inversion, no nipple discharge, no skin discoloration, no axillary or supraclavicular lymphadenopathy Abdomen: no palpable masses or  tenderness, no rebound or guarding Extremities: no edema or skin discoloration or tenderness  Pelvic:  Bartholin, Urethra, Skene Glands: Within normal limits             Vagina: No gross lesions or discharge, vaginal atrophy  Cervix: absent  Uterus  Absent  Adnexa  Without masses or tenderness  Anus and perineum  normal   Rectovaginal  normal sphincter tone without palpated masses or tenderness             Hemoccult cars provided     Assessment/Plan:  71 y.o.  female with vaginal atrophy contribute to irritation dryness. Patient will be placed on vaginal estradiol 0.02% to apply twice a week. Risks benefits and pros and cons of hormone replacement  therapywas discussed as well as literature information was provided. Pap smear not done today for a new guidelines were discussed. Patient was provided with Hemoccult cards for testing to submit to the office at a later date. Patient will schedule her bone density study. We discussed the importance of calcium and vitamin D for osteoporosis prevention. Literature information on the Tdap vaccine for her to discuss with her PCP provided.    Ok Edwards MD, 11:39 AM 04/06/2013

## 2013-04-06 NOTE — Patient Instructions (Addendum)
Tetanus, Diphtheria, Pertussis (Tdap) Vaccine What You Need to Know WHY GET VACCINATED? Tetanus, diphtheria and pertussis can be very serious diseases, even for adolescents and adults. Tdap vaccine can protect us from these diseases. TETANUS (Lockjaw) causes painful muscle tightening and stiffness, usually all over the body.  It can lead to tightening of muscles in the head and neck so you can't open your mouth, swallow, or sometimes even breathe. Tetanus kills about 1 out of 5 people who are infected. DIPHTHERIA can cause a thick coating to form in the back of the throat.  It can lead to breathing problems, paralysis, heart failure, and death. PERTUSSIS (Whooping Cough) causes severe coughing spells, which can cause difficulty breathing, vomiting and disturbed sleep.  It can also lead to weight loss, incontinence, and rib fractures. Up to 2 in 100 adolescents and 5 in 100 adults with pertussis are hospitalized or have complications, which could include pneumonia and death. These diseases are caused by bacteria. Diphtheria and pertussis are spread from person to person through coughing or sneezing. Tetanus enters the body through cuts, scratches, or wounds. Before vaccines, the [place] saw as many as 200,000 cases a year of diphtheria and pertussis, and hundreds of cases of tetanus. Since vaccination began, tetanus and diphtheria have dropped by about 99% and pertussis by about 80%. TDAP VACCINE Tdap vaccine can protect adolescents and adults from tetanus, diphtheria, and pertussis. One dose of Tdap is routinely given at age 11 or 12. People who did not get Tdap at that age should get it as soon as possible. Tdap is especially important for health care professionals and anyone having close contact with a baby younger than 12 months. Pregnant women should get a dose of Tdap during every pregnancy, to protect the newborn from pertussis. Infants are most at risk for severe, life-threatening  complications from pertussis. A similar vaccine, called Td, protects from tetanus and diphtheria, but not pertussis. A Td booster should be given every 10 years. Tdap may be given as one of these boosters if you have not already gotten a dose. Tdap may also be given after a severe cut or burn to prevent tetanus infection. Your doctor can give you more information. Tdap may safely be given at the same time as other vaccines. SOME PEOPLE SHOULD NOT GET THIS VACCINE  If you ever had a life-threatening allergic reaction after a dose of any tetanus, diphtheria, or pertussis containing vaccine, OR if you have a severe allergy to any part of this vaccine, you should not get Tdap. Tell your doctor if you have any severe allergies.  If you had a coma, or long or multiple seizures within 7 days after a childhood dose of DTP or DTaP, you should not get Tdap, unless a cause other than the vaccine was found. You can still get Td.  Talk to your doctor if you:  have epilepsy or another nervous system problem,  had severe pain or swelling after any vaccine containing diphtheria, tetanus or pertussis,  ever had Guillain-Barr Syndrome (GBS),  aren't feeling well on the day the shot is scheduled. RISKS OF A VACCINE REACTION With any medicine, including vaccines, there is a chance of side effects. These are usually mild and go away on their own, but serious reactions are also possible. Brief fainting spells can follow a vaccination, leading to injuries from falling. Sitting or lying down for about 15 minutes can help prevent these. Tell your doctor if you feel dizzy or light-headed, or   have vision changes or ringing in the ears. Mild problems following Tdap (Did not interfere with activities)  Pain where the shot was given (about 3 in 4 adolescents or 2 in 3 adults)  Redness or swelling where the shot was given (about 1 person in 5)  Mild fever of at least 100.80F (up to about 1 in 25 adolescents or 1 in  100 adults)  Headache (about 3 or 4 people in 10)  Tiredness (about 1 person in 3 or 4)  Nausea, vomiting, diarrhea, stomach ache (up to 1 in 4 adolescents or 1 in 10 adults)  Chills, body aches, sore joints, rash, swollen glands (uncommon) Moderate problems following Tdap (Interfered with activities, but did not require medical attention)  Pain where the shot was given (about 1 in 5 adolescents or 1 in 100 adults)  Redness or swelling where the shot was given (up to about 1 in 16 adolescents or 1 in 25 adults)  Fever over 102F (about 1 in 100 adolescents or 1 in 250 adults)  Headache (about 3 in 20 adolescents or 1 in 10 adults)  Nausea, vomiting, diarrhea, stomach ache (up to 1 or 3 people in 100)  Swelling of the entire arm where the shot was given (up to about 3 in 100). Severe problems following Tdap (Unable to perform usual activities, required medical attention)  Swelling, severe pain, bleeding and redness in the arm where the shot was given (rare). A severe allergic reaction could occur after any vaccine (estimated less than 1 in a million doses). WHAT IF THERE IS A SERIOUS REACTION? What should I look for?  Look for anything that concerns you, such as signs of a severe allergic reaction, very high fever, or behavior changes. Signs of a severe allergic reaction can include hives, swelling of the face and throat, difficulty breathing, a fast heartbeat, dizziness, and weakness. These would start a few minutes to a few hours after the vaccination. What should I do?  If you think it is a severe allergic reaction or other emergency that can't wait, call 9-1-1 or get the person to the nearest hospital. Otherwise, call your doctor.  Afterward, the reaction should be reported to the "Vaccine Adverse Event Reporting System" (VAERS). Your doctor might file this report, or you can do it yourself through the VAERS web site at www.vaers.LAgents.no, or by calling 1-860-721-3229. VAERS is  only for reporting reactions. They do not give medical advice.  THE NATIONAL VACCINE INJURY COMPENSATION PROGRAM The National Vaccine Injury Compensation Program (VICP) is a federal program that was created to compensate people who may have been injured by certain vaccines. Persons who believe they may have been injured by a vaccine can learn about the program and about filing a claim by calling 1-(347) 258-0970 or visiting the VICP website at SpiritualWord.at. HOW CAN I LEARN MORE?  Ask your doctor.  Call your local or state health department.  Contact the Centers for Disease Control and Prevention (CDC):  Call (716)673-3786 or visit CDC's website at PicCapture.uy. CDC Tdap Vaccine VIS (12/03/11) Document Released: 01/12/2012 Document Revised: 04/06/2012 Document Reviewed: 01/12/2012 ExitCare Patient Information 2014 [place], [place].   Oscal or Citracal or Caltrate or Viactiv take one twice a day . (1200 mg Ca and 1000-2000 of Vit D daily requirement  Hormone Therapy At menopause, your body begins making less estrogen and progesterone hormones. This causes the body to stop having menstrual periods. This is because estrogen and progesterone hormones control your periods and menstrual cycle.  A lack of estrogen may cause symptoms such as:  Hot flushes (or hot flashes).  Vaginal dryness.  Dry skin.  Loss of sex drive.  Risk of bone loss (osteoporosis). When this happens, you may choose to take hormone therapy to get back the estrogen lost during menopause. When the hormone estrogen is given alone, it is usually referred to as ET (Estrogen Therapy). When the hormone progestin is combined with estrogen, it is generally called HT (Hormone Therapy). This was formerly known as hormone replacement therapy (HRT). Your caregiver can help you make a decision on what will be best for you. The decision to use HT seems to change often as new studies are done. Many studies do not  agree on the benefits of hormone replacement therapy. LIKELY BENEFITS OF HT INCLUDE PROTECTION FROM:  Hot Flushes (also called hot flashes) - A hot flush is a sudden feeling of heat that spreads over the face and body. The skin may redden like a blush. It is connected with sweats and sleep disturbance. Women going through menopause may have hot flushes a few times a month or several times per day depending on the woman.  Osteoporosis (bone loss)- Estrogen helps guard against bone loss. After menopause, a woman's bones slowly lose calcium and become weak and brittle. As a result, bones are more likely to break. The hip, wrist, and spine are affected most often. Hormone therapy can help slow bone loss after menopause. Weight bearing exercise and taking calcium with vitamin D also can help prevent bone loss. There are also medications that your caregiver can prescribe that can help prevent osteoporosis.  Vaginal Dryness - Loss of estrogen causes changes in the vagina. Its lining may become thin and dry. These changes can cause pain and bleeding during sexual intercourse. Dryness can also lead to infections. This can cause burning and itching. (Vaginal estrogen treatment can help relieve pain, itching, and dryness.)  Urinary Tract Infections are more common after menopause because of lack of estrogen. Some women also develop urinary incontinence because of low estrogen levels in the vagina and bladder.  Possible other benefits of estrogen include a positive effect on mood and short-term memory in women. RISKS AND COMPLICATIONS  Using estrogen alone without progesterone causes the lining of the uterus to grow. This increases the risk of lining of the uterus (endometrial) cancer. Your caregiver should give another hormone called progestin if you have a uterus.  Women who take combined (estrogen and progestin) HT appear to have an increased risk of breast cancer. The risk appears to be small, but increases  throughout the time that HT is taken.  Combined therapy also makes the breast tissue slightly denser which makes it harder to read mammograms (breast X-rays).  Combined, estrogen and progesterone therapy can be taken together every day, in which case there may be spotting of blood. HT therapy can be taken cyclically in which case you will have menstrual periods. Cyclically means HT is taken for a set amount of days, then not taken, then this process is repeated.  HT may increase the risk of stroke, heart attack, breast cancer and forming blood clots in your leg.  Transdermal estrogen (estrogen that is absorbed through the skin with a patch or a cream) may have more positive results with:  Cholesterol.  Blood pressure.  Blood clots. Having the following conditions may indicate you should not have HT:  Endometrial cancer.  Liver disease.  Breast cancer.  Heart disease.  History of  blood clots.  Stroke. TREATMENT   If you choose to take HT and have a uterus, usually estrogen and progestin are prescribed.  Your caregiver will help you decide the best way to take the medications.  Possible ways to take estrogen include:  Pills.  Patches.  Gels.  Sprays.  Vaginal estrogen cream, rings and tablets.  It is best to take the lowest dose possible that will help your symptoms and take them for the shortest period of time that you can.  Hormone therapy can help relieve some of the problems (symptoms) that affect women at menopause. Before making a decision about HT, talk to your caregiver about what is best for you. Be well informed and comfortable with your decisions. HOME CARE INSTRUCTIONS   Follow your caregivers advice when taking the medications.  A Pap test is done to screen for cervical cancer.  The first Pap test should be done at age 24.  Between ages 60 and 62, Pap tests are repeated every 2 years.  Beginning at age 28, you are advised to have a Pap test every  3 years as long as your past 3 Pap tests have been normal.  Some women have medical problems that increase the chance of getting cervical cancer. Talk to your caregiver about these problems. It is especially important to talk to your caregiver if a new problem develops soon after your last Pap test. In these cases, your caregiver may recommend more frequent screening and Pap tests.  The above recommendations are the same for women who have or have not gotten the vaccine for HPV (Human Papillomavirus).  If you had a hysterectomy for a problem that was not a cancer or a condition that could lead to cancer, then you no longer need Pap tests. However, even if you no longer need a Pap test, a regular exam is a good idea to make sure no other problems are starting.   If you are between ages 89 and 48, and you have had normal Pap tests going back 10 years, you no longer need Pap tests. However, even if you no longer need a Pap test, a regular exam is a good idea to make sure no other problems are starting.   If you have had past treatment for cervical cancer or a condition that could lead to cancer, you need Pap tests and screening for cancer for at least 20 years after your treatment.  If Pap tests have been discontinued, risk factors (such as a new sexual partner) need to be re-assessed to determine if screening should be resumed.  Some women may need screenings more often if they are at high risk for cervical cancer.  Get mammograms done as per the advice of your caregiver. SEEK IMMEDIATE MEDICAL CARE IF:  You develop abnormal vaginal bleeding.  You have pain or swelling in your legs, shortness of breath, or chest pain.  You develop dizziness or headaches.  You have lumps or changes in your breasts or armpits.  You have slurred speech.  You develop weakness or numbness of your arms or legs.  You have pain, burning, or bleeding when urinating.  You develop abdominal pain. Document  Released: 04/11/2003 Document Revised: 10/05/2011 Document Reviewed: 07/30/2010 [facility] Patient Information 2014 [place], [place].

## 2013-04-10 ENCOUNTER — Ambulatory Visit (HOSPITAL_COMMUNITY): Payer: Medicare Other

## 2013-04-14 ENCOUNTER — Other Ambulatory Visit: Payer: Medicare Other | Admitting: Anesthesiology

## 2013-04-14 DIAGNOSIS — Z1211 Encounter for screening for malignant neoplasm of colon: Secondary | ICD-10-CM

## 2013-04-24 ENCOUNTER — Encounter (HOSPITAL_COMMUNITY): Payer: 59

## 2013-06-01 ENCOUNTER — Other Ambulatory Visit: Payer: Self-pay | Admitting: Gynecology

## 2013-06-01 ENCOUNTER — Ambulatory Visit (INDEPENDENT_AMBULATORY_CARE_PROVIDER_SITE_OTHER): Payer: Medicare Other

## 2013-06-01 DIAGNOSIS — M8580 Other specified disorders of bone density and structure, unspecified site: Secondary | ICD-10-CM

## 2013-06-01 DIAGNOSIS — Z780 Asymptomatic menopausal state: Secondary | ICD-10-CM

## 2013-06-01 DIAGNOSIS — M899 Disorder of bone, unspecified: Secondary | ICD-10-CM

## 2013-06-09 ENCOUNTER — Other Ambulatory Visit: Payer: Self-pay | Admitting: *Deleted

## 2013-06-09 DIAGNOSIS — M8580 Other specified disorders of bone density and structure, unspecified site: Secondary | ICD-10-CM

## 2013-06-12 ENCOUNTER — Other Ambulatory Visit: Payer: Medicare Other

## 2013-06-12 DIAGNOSIS — M8580 Other specified disorders of bone density and structure, unspecified site: Secondary | ICD-10-CM

## 2013-06-14 ENCOUNTER — Ambulatory Visit (INDEPENDENT_AMBULATORY_CARE_PROVIDER_SITE_OTHER): Payer: Medicare Other | Admitting: Gynecology

## 2013-06-14 ENCOUNTER — Encounter: Payer: Self-pay | Admitting: Gynecology

## 2013-06-14 VITALS — BP 128/74

## 2013-06-14 DIAGNOSIS — M8580 Other specified disorders of bone density and structure, unspecified site: Secondary | ICD-10-CM | POA: Insufficient documentation

## 2013-06-14 DIAGNOSIS — M899 Disorder of bone, unspecified: Secondary | ICD-10-CM

## 2013-06-14 NOTE — Progress Notes (Signed)
   Patient is 72 years of age who was seen in the office on 04/06/2013 for her annual gynecological examination. See previous note for details. She presented to the office today to discuss her bone density study and to compare with 2011. The patient has a history of total abdominal hysterectomy with bilateral salpingo-oophorectomy in the past for fibroids and endometriosis. The patient has severe rheumatoid arthritis contributing to the deformities of the upper extremities especially the hands. She has been on prednisone at different times throughout the year but not currently. She also has been treated with methotrexate and is taking Plaquenil as well.  Bone density study done here in her office was compare with previous study of 2011 and her lowest T score was at the AP spine with a value -2.1 (decreased bone mineralization/osteopenia). She had a 50% decrease in bone mineralization of the AP spinal was compare with previous study. The remainder of the region of interest were normal.  We discussed today that her bone mass in the AP spine is 20-25% below normal in that she has a 5 times greater risk of fracture. We discussed also the risks benefits and pros and cons of antiresorptive agents to include osteonecrosis of the jaw a spontaneous subtrochanteric fractures. A vitamin D level was done a few days ago which was normal. We still do not have the results of the calcium and PTH. She is taking calcium with vitamin D and maintains a active lifestyle.  Assessment/plan: Patient with osteopenia significant decrease in bone mineralization from 2011 who would like to wait and repeat bone density study in 2 years rather than start on any medication at the present time. Literature information was provided.

## 2013-06-14 NOTE — Patient Instructions (Signed)
Osteoporosis Throughout your life, your body breaks down old bone and replaces it with new bone. As you get older, your body does not replace bone as quickly as it breaks it down. By the age of 30 years, most people begin to gradually lose bone because of the imbalance between bone loss and replacement. Some people lose more bone than others. Bone loss beyond a specified normal degree is considered osteoporosis.  Osteoporosis affects the strength and durability of your bones. The inside of the ends of your bones and your flat bones, like the bones of your pelvis, look like honeycomb, filled with tiny open spaces. As bone loss occurs, your bones become less dense. This means that the open spaces inside your bones become bigger and the walls between these spaces become thinner. This makes your bones weaker. Bones of a person with osteoporosis can become so weak that they can break (fracture) during minor accidents, such as a simple fall. CAUSES  The following factors have been associated with the development of osteoporosis:  Smoking.  Drinking more than 2 alcoholic drinks several days per week.  Long-term use of certain medicines:  Corticosteroids.  Chemotherapy medicines.  Thyroid medicines.  Antiepileptic medicines.  Gonadal hormone suppression medicine.  Immunosuppression medicine.  Being underweight.  Lack of physical activity.  Lack of exposure to the sun. This can lead to vitamin D deficiency.  Certain medical conditions:  Certain inflammatory bowel diseases, such as Crohn disease and ulcerative colitis.  Diabetes.  Hyperthyroidism.  Hyperparathyroidism. RISK FACTORS Anyone can develop osteoporosis. However, the following factors can increase your risk of developing osteoporosis:  Gender Women are at higher risk than men.  Age Being older than 50 years increases your risk.  Ethnicity White and Asian people have an increased risk.  Weight Being extremely  underweight can increase your risk of osteoporosis.  Family history of osteoporosis Having a family member who has developed osteoporosis can increase your risk. SYMPTOMS  Usually, people with osteoporosis have no symptoms.  DIAGNOSIS  Signs during a physical exam that may prompt your caregiver to suspect osteoporosis include:  Decreased height. This is usually caused by the compression of the bones that form your spine (vertebrae) because they have weakened and become fractured.  A curving or rounding of the upper back (kyphosis). To confirm signs of osteoporosis, your caregiver may request a procedure that uses 2 low-dose X-ray beams with different levels of energy to measure your bone mineral density (dual-energy X-ray absorptiometry [DXA]). Also, your caregiver may check your level of vitamin D. TREATMENT  The goal of osteoporosis treatment is to strengthen bones in order to decrease the risk of bone fractures. There are different types of medicines available to help achieve this goal. Some of these medicines work by slowing the processes of bone loss. Some medicines work by increasing bone density. Treatment also involves making sure that your levels of calcium and vitamin D are adequate. PREVENTION  There are things you can do to help prevent osteoporosis. Adequate intake of calcium and vitamin D can help you achieve optimal bone mineral density. Regular exercise can also help, especially resistance and weight-bearing activities. If you smoke, quitting smoking is an important part of osteoporosis prevention. MAKE SURE YOU:  Understand these instructions.  Will watch your condition.  Will get help right away if you are not doing well or get worse. FOR MORE INFORMATION www.osteo.org and www.nof.org Document Released: 04/22/2005 Document Revised: 11/07/2012 Document Reviewed: 06/27/2011 [employer] Patient Information 2014 [employer].  

## 2013-06-26 ENCOUNTER — Institutional Professional Consult (permissible substitution): Payer: Medicare Other | Admitting: Gynecology

## 2013-07-04 ENCOUNTER — Encounter (INDEPENDENT_AMBULATORY_CARE_PROVIDER_SITE_OTHER): Payer: Self-pay | Admitting: *Deleted

## 2013-07-06 ENCOUNTER — Telehealth: Payer: Self-pay | Admitting: *Deleted

## 2013-07-06 NOTE — Telephone Encounter (Signed)
LMOM to call.

## 2013-07-06 NOTE — Telephone Encounter (Signed)
Pt called to set up her TCS, pt got a letter in the mail. Please advise [phone]

## 2013-07-06 NOTE — Telephone Encounter (Signed)
Pt called back. She wants Dr. Karilyn Cota and I gave her his phone number.

## 2013-07-07 ENCOUNTER — Ambulatory Visit: Payer: Medicare Other

## 2013-07-24 ENCOUNTER — Telehealth: Payer: Self-pay | Admitting: *Deleted

## 2013-07-24 NOTE — Telephone Encounter (Signed)
Pt called to set up her TCS. Please advise [phone]

## 2013-07-25 NOTE — Telephone Encounter (Signed)
Called pt and spoke to husband. He said she works on [place]. He will have her call me. ( She had previously told me she wanted Dr. Karilyn Cota to do her colonoscopy).

## 2013-07-26 ENCOUNTER — Other Ambulatory Visit (INDEPENDENT_AMBULATORY_CARE_PROVIDER_SITE_OTHER): Payer: Self-pay | Admitting: *Deleted

## 2013-07-26 ENCOUNTER — Telehealth (INDEPENDENT_AMBULATORY_CARE_PROVIDER_SITE_OTHER): Payer: Self-pay | Admitting: *Deleted

## 2013-07-26 DIAGNOSIS — Z1211 Encounter for screening for malignant neoplasm of colon: Secondary | ICD-10-CM

## 2013-07-26 MED ORDER — PEG-KCL-NACL-NASULF-NA ASC-C 100 G PO SOLR: 1.0000 | Freq: Once | ORAL | Status: DC

## 2013-07-26 NOTE — Telephone Encounter (Signed)
agree

## 2013-07-26 NOTE — Telephone Encounter (Signed)
  Procedure: tcs  Reason/Indication:  screening  Has patient had this procedure before?  Yes, 2004 -- epic  If so, when, by whom and where?    Is there a family history of colon cancer?  no  Who?  What age when diagnosed?    Is patient diabetic?   no      Does patient have prosthetic heart valve?  no  Do you have a pacemaker?  no  Has patient ever had endocarditis? no  Has patient had joint replacement within last 12 months?  no  Does patient tend to be constipated or take laxatives? sometimes  Is patient on Coumadin, Plavix and/or Aspirin? yes  Medications: asa 81 mg daily, hydroxychloroquine 200 mg bid, alprazolam 0.25 mg daily, methotrexate 2.5 mg 5 tab a week, vit d, folic acid, losartan/hctz 100/12.5 mg daily  Allergies: nkda  Medication Adjustment: asa 2 days  Procedure date & time: 08/24/13 at 100

## 2013-07-26 NOTE — Telephone Encounter (Signed)
Patient needs movi prep 

## 2013-07-28 NOTE — Telephone Encounter (Signed)
Pt is scheduled with Dr. Laural Golden on 08/24/2013 for her colonoscopy.

## 2013-08-09 ENCOUNTER — Ambulatory Visit (INDEPENDENT_AMBULATORY_CARE_PROVIDER_SITE_OTHER): Payer: Medicare Other

## 2013-08-09 VITALS — BP 123/68 | HR 63 | Resp 18

## 2013-08-09 DIAGNOSIS — B351 Tinea unguium: Secondary | ICD-10-CM

## 2013-08-09 DIAGNOSIS — M79609 Pain in unspecified limb: Secondary | ICD-10-CM

## 2013-08-09 DIAGNOSIS — S90129A Contusion of unspecified lesser toe(s) without damage to nail, initial encounter: Secondary | ICD-10-CM

## 2013-08-09 NOTE — Progress Notes (Signed)
   Subjective:    Patient ID: Bethany Grant, female    DOB: [birth year], 73 y.o.   MRN: [id number]  HPI went to [place] at thanksgiving and noticed that they were getting infected until I could get back here to have them checked and wore tennis shoes the whole time I was there and the right big toenail came off and left big toenail is almost off and went to urgent care and was given an antibotic    Review of Systems  Constitutional: Negative.   HENT: Negative.   Eyes: Negative.   Respiratory: Negative.   Cardiovascular: Negative.   Gastrointestinal: Negative.   Endocrine: Negative.   Genitourinary: Negative.   Musculoskeletal: Negative.   Skin: Negative.   Allergic/Immunologic: Negative.   Neurological: Negative.   Hematological: Bruises/bleeds easily.  Psychiatric/Behavioral: Negative.        Objective:   Physical Exam Vascular status is intact with pedal pulses palpable DP and PT +2/4 bilateral. Refill timed 3-4 seconds all digits neurologically epicritic and proprioceptive sensations intact and symmetric bilateral. There is history of contusion to the hallux nail plates bilateral the right has audible avulsed the left is still pain on elbow nail plate is separate from the nailbed. There was a history of infection treated with oral antibiotics from the urgent center. There is some tenderness to the nail beds no discharge or drainage noted no secondary infection is noted. Patient does have some digital deformities hammertoe deformities and bunion deformities and rigid contractures of toes a laser on symptomatic and noncontributory to the current problem.       Assessment & Plan:  Assessment contusion hallux nails bilateral with partial avulsion of nail having occurred. There is some thickening yellowing discoloration dystrophy consistent with possible early onychomycosis of nails. This time the hallux nails both debrided down to the proximal nail fold and recommended topical  Fungi-Nail to the affected nails for 6-12 months duration followup in the future as needed if any recurrence exacerbations or any problems.  Harriet Masson DPM

## 2013-08-09 NOTE — Patient Instructions (Signed)
Onychomycosis/Fungal Toenails  WHAT IS IT? An infection that lies within the keratin of your nail plate that is caused by a fungus.  WHY ME? Fungal infections affect all ages, sexes, races, and creeds.  There may be many factors that predispose you to a fungal infection such as age, coexisting medical conditions such as diabetes, or an autoimmune disease; stress, medications, fatigue, genetics, etc.  Bottom line: fungus thrives in a warm, moist environment and your shoes offer such a location.  IS IT CONTAGIOUS? Theoretically, yes.  You do not want to share shoes, nail clippers or files with someone who has fungal toenails.  Walking around barefoot in the same room or sleeping in the same bed is unlikely to transfer the organism.  It is important to realize, however, that fungus can spread easily from one nail to the next on the same foot.  HOW DO WE TREAT THIS?  There are several ways to treat this condition.  Treatment may depend on many factors such as age, medications, pregnancy, liver and kidney conditions, etc.  It is best to ask your doctor which options are available to you.  1. No treatment.   Unlike many other medical concerns, you can live with this condition.  However for many people this can be a painful condition and may lead to ingrown toenails or a bacterial infection.  It is recommended that you keep the nails cut short to help reduce the amount of fungal nail. 2. Topical treatment.  These range from herbal remedies to prescription strength nail lacquers.  About 40-50% effective, topicals require twice daily application for approximately 9 to 12 months or until an entirely new nail has grown out.  The most effective topicals are medical grade medications available through physicians offices. 3. Oral antifungal medications.  With an 80-90% cure rate, the most common oral medication requires 3 to 4 months of therapy and stays in your system for a year as the new nail grows out.  Oral  antifungal medications do require blood work to make sure it is a safe drug for you.  A liver function panel will be performed prior to starting the medication and after the first month of treatment.  It is important to have the blood work performed to avoid any harmful side effects.  In general, this medication safe but blood work is required. 4. Laser Therapy.  This treatment is performed by applying a specialized laser to the affected nail plate.  This therapy is noninvasive, fast, and non-painful.  It is not covered by insurance and is therefore, out of pocket.  The results have been very good with a 80-95% cure rate.  [facility] is the only practice in the area to offer this therapy. 5. Permanent Nail Avulsion.  Removing the entire nail so that a new nail will not grow back.    Obtained Fungi-Nail over-the-counter at any pharmacy. Apply to the affected nail and nailbed once or twice daily for 6-12 months duration until normal nail has grown back out. Discontinue the if the cause any skin irritation

## 2013-08-10 ENCOUNTER — Encounter (HOSPITAL_COMMUNITY): Payer: Self-pay | Admitting: Pharmacy Technician

## 2013-08-24 ENCOUNTER — Encounter (HOSPITAL_COMMUNITY)
Admission: RE | Disposition: A | Discharge status: Home / Self Care | Payer: Self-pay | Source: Ambulatory Visit | Attending: Internal Medicine

## 2013-08-24 ENCOUNTER — Encounter (HOSPITAL_COMMUNITY): Payer: Self-pay | Admitting: *Deleted

## 2013-08-24 ENCOUNTER — Ambulatory Visit (HOSPITAL_COMMUNITY)
Admission: RE | Admit: 2013-08-24 | Discharge: 2013-08-24 | Disposition: A | Discharge status: Home / Self Care | Payer: Medicare Other | Source: Ambulatory Visit | Attending: Internal Medicine | Admitting: Internal Medicine

## 2013-08-24 DIAGNOSIS — Z79899 Other long term (current) drug therapy: Secondary | ICD-10-CM | POA: Insufficient documentation

## 2013-08-24 DIAGNOSIS — Z801 Family history of malignant neoplasm of trachea, bronchus and lung: Secondary | ICD-10-CM | POA: Insufficient documentation

## 2013-08-24 DIAGNOSIS — Z7982 Long term (current) use of aspirin: Secondary | ICD-10-CM | POA: Insufficient documentation

## 2013-08-24 DIAGNOSIS — E7800 Pure hypercholesterolemia, unspecified: Secondary | ICD-10-CM | POA: Insufficient documentation

## 2013-08-24 DIAGNOSIS — K5730 Diverticulosis of large intestine without perforation or abscess without bleeding: Secondary | ICD-10-CM | POA: Insufficient documentation

## 2013-08-24 DIAGNOSIS — I10 Essential (primary) hypertension: Secondary | ICD-10-CM | POA: Insufficient documentation

## 2013-08-24 DIAGNOSIS — Z1211 Encounter for screening for malignant neoplasm of colon: Secondary | ICD-10-CM

## 2013-08-24 HISTORY — PX: COLONOSCOPY: SHX5424

## 2013-08-24 SURGERY — COLONOSCOPY
Anesthesia: Moderate Sedation

## 2013-08-24 MED ORDER — STERILE WATER FOR IRRIGATION IR SOLN
Status: DC | PRN
  Administered 2013-08-24: 13:00:00

## 2013-08-24 MED ORDER — MIDAZOLAM HCL 5 MG/5ML IJ SOLN
INTRAMUSCULAR | Status: AC
  Filled 2013-08-24: qty 10

## 2013-08-24 MED ORDER — SODIUM CHLORIDE 0.9 % IV SOLN
INTRAVENOUS | Status: DC
  Administered 2013-08-24: 12:00:00 via INTRAVENOUS

## 2013-08-24 MED ORDER — MIDAZOLAM HCL 5 MG/5ML IJ SOLN
INTRAMUSCULAR | Status: DC | PRN
  Administered 2013-08-24 (×4): 2 mg via INTRAVENOUS

## 2013-08-24 MED ORDER — MEPERIDINE HCL 50 MG/ML IJ SOLN
INTRAMUSCULAR | Status: AC
  Filled 2013-08-24: qty 1

## 2013-08-24 MED ORDER — MEPERIDINE HCL 50 MG/ML IJ SOLN
INTRAMUSCULAR | Status: DC | PRN
  Administered 2013-08-24 (×2): 25 mg via INTRAVENOUS

## 2013-08-24 MED ORDER — NYSTATIN-TRIAMCINOLONE 100000-0.1 UNIT/GM-% EX CREA: 1.0000 "application " | TOPICAL_CREAM | Freq: Two times a day (BID) | CUTANEOUS | Status: DC

## 2013-08-24 MED ORDER — ONDANSETRON HCL 4 MG/2ML IJ SOLN
INTRAMUSCULAR | Status: AC
  Filled 2013-08-24: qty 2

## 2013-08-24 MED ORDER — ONDANSETRON HCL 4 MG/2ML IJ SOLN
INTRAMUSCULAR | Status: DC | PRN
  Administered 2013-08-24: 4 mg via INTRAVENOUS

## 2013-08-24 NOTE — Discharge Instructions (Signed)
Resume usual medications and high fiber diet. Fiber supplement 4 g daily at bedtime. Mycolog-II cream to be applied to perianal area twice daily for 2 weeks and then on as-needed basis. No driving for 24 hours. Next screening exam in 10 years.  Colonoscopy, Care After Refer to this sheet in the next few weeks. These instructions provide you with information on caring for yourself after your procedure. Your health care provider may also give you more specific instructions. Your treatment has been planned according to current medical practices, but problems sometimes occur. Call your health care provider if you have any problems or questions after your procedure. WHAT TO EXPECT AFTER THE PROCEDURE  After your procedure, it is typical to have the following:  A small amount of blood in your stool.  Moderate amounts of gas and mild abdominal cramping or bloating. HOME CARE INSTRUCTIONS  Do not drive, operate machinery, or sign important documents for 24 hours.  You may shower and resume your regular physical activities, but move at a slower pace for the first 24 hours.  Take frequent rest periods for the first 24 hours.  Walk around or put a warm pack on your abdomen to help reduce abdominal cramping and bloating.  Drink enough fluids to keep your urine clear or pale yellow.  You may resume your normal diet as instructed by your health care provider. Avoid heavy or fried foods that are hard to digest.  Avoid drinking alcohol for 24 hours or as instructed by your health care provider.  Only take over-the-counter or prescription medicines as directed by your health care provider.  If a tissue sample (biopsy) was taken during your procedure:  Do not take aspirin or blood thinners for 7 days, or as instructed by your health care provider.  Do not drink alcohol for 7 days, or as instructed by your health care provider.  Eat soft foods for the first 24 hours. SEEK MEDICAL CARE IF: You  have persistent spotting of blood in your stool 2 3 days after the procedure. SEEK IMMEDIATE MEDICAL CARE IF:  You have more than a small spotting of blood in your stool.  You pass large blood clots in your stool.  Your abdomen is swollen (distended).  You have nausea or vomiting.  You have a fever.  You have increasing abdominal pain that is not relieved with medicine. Document Released: 02/25/2004 Document Revised: 05/03/2013 Document Reviewed: 03/20/2013 [facility] Patient Information 2014 [place].   High-Fiber Diet Fiber is found in fruits, vegetables, and grains. A high-fiber diet encourages the addition of more whole grains, legumes, fruits, and vegetables in your diet. The recommended amount of fiber for adult males is 38 g per day. For adult females, it is 25 g per day. Pregnant and lactating women should get 28 g of fiber per day. If you have a digestive or bowel problem, ask your caregiver for advice before adding high-fiber foods to your diet. Eat a variety of high-fiber foods instead of only a select few type of foods.  PURPOSE  To increase stool bulk.  To make bowel movements more regular to prevent constipation.  To lower cholesterol.  To prevent overeating. WHEN IS THIS DIET USED?  It may be used if you have constipation and hemorrhoids.  It may be used if you have uncomplicated diverticulosis (intestine condition) and irritable bowel syndrome.  It may be used if you need help with weight management.  It may be used if you want to add it  to your diet as a protective measure against atherosclerosis, diabetes, and cancer. SOURCES OF FIBER  Whole-grain breads and cereals.  Fruits, such as apples, oranges, bananas, berries, prunes, and pears.  Vegetables, such as green peas, carrots, sweet potatoes, beets, broccoli, cabbage, spinach, and artichokes.  Legumes, such split peas, soy, lentils.  Almonds. FIBER CONTENT IN FOODS Starches and Grains /  Dietary Fiber (g)  Cheerios, 1 cup / 3 g  Corn Flakes cereal, 1 cup / 0.7 g  Rice crispy treat cereal, 1 cup / 0.3 g  Instant oatmeal (cooked),  cup / 2 g  Frosted wheat cereal, 1 cup / 5.1 g  Brown, long-grain rice (cooked), 1 cup / 3.5 g  White, long-grain rice (cooked), 1 cup / 0.6 g  Enriched macaroni (cooked), 1 cup / 2.5 g Legumes / Dietary Fiber (g)  Baked beans (canned, plain, or vegetarian),  cup / 5.2 g  Kidney beans (canned),  cup / 6.8 g  Pinto beans (cooked),  cup / 5.5 g Breads and Crackers / Dietary Fiber (g)  Plain or honey graham crackers, 2 squares / 0.7 g  Saltine crackers, 3 squares / 0.3 g  Plain, salted pretzels, 10 pieces / 1.8 g  Whole-wheat bread, 1 slice / 1.9 g  White bread, 1 slice / 0.7 g  Raisin bread, 1 slice / 1.2 g  Plain bagel, 3 oz / 2 g  Flour tortilla, 1 oz / 0.9 g  Corn tortilla, 1 small / 1.5 g  Hamburger or hotdog bun, 1 small / 0.9 g Fruits / Dietary Fiber (g)  Apple with skin, 1 medium / 4.4 g  Sweetened applesauce,  cup / 1.5 g  Banana,  medium / 1.5 g  Grapes, 10 grapes / 0.4 g  Orange, 1 small / 2.3 g  Raisin, 1.5 oz / 1.6 g  Melon, 1 cup / 1.4 g Vegetables / Dietary Fiber (g)  Green beans (canned),  cup / 1.3 g  Carrots (cooked),  cup / 2.3 g  Broccoli (cooked),  cup / 2.8 g  Peas (cooked),  cup / 4.4 g  Mashed potatoes,  cup / 1.6 g  Lettuce, 1 cup / 0.5 g  Corn (canned),  cup / 1.6 g  Tomato,  cup / 1.1 g Document Released: 07/13/2005 Document Revised: 01/12/2012 Document Reviewed: 10/15/2011 [facility] Patient Information 2014 [place], [place].

## 2013-08-24 NOTE — H&P (Signed)
Bethany Grant is an 73 y.o. female.   Chief Complaint: Patient is here for colonoscopy. HPI: Patient is 73 year old Caucasian female who is here for screening colonoscopy. Exam was in 2004. She denies rectal bleeding. She does complain of constipation. She is drinking prune juice and consuming fiber rich foods with help.  Family history is negative for CRC.  Past Medical History  Diagnosis Date  . Endometriosis   . Fibroid   . DUB (dysfunctional uterine bleeding)   . Rheumatoid arthritis(714.0)   . Hypercholesterolemia   . Hypertension     Past Surgical History  Procedure Laterality Date  . Abdominal hysterectomy  1991    TAH/BSO  . Hemorrhoid surgery    . Hand surgery    . Foot surgery      2 TIMES  . Breast surgery  1988    BREAST CYST  . Bartholin gland cyst excision  1980    RIGHT  . Oophorectomy      BSO  . Colonoscopy      Family History  Problem Relation Age of Onset  . Cancer Mother     LUNG  . Hypertension Sister   . Cancer Sister     LUNG, THYROID, UTERINE  . Cancer Brother     LUNG  . Cancer Sister     Lung cancer  . Colon cancer Neg Hx    Social History:  reports that she quit smoking about 9 years ago. Her smoking use included Cigarettes. She has a 24 pack-year smoking history. She has never used smokeless tobacco. She reports that she does not drink alcohol or use illicit drugs.  Allergies:  Allergies  Allergen Reactions  . Other Nausea And Vomiting    Doesn't do well with pain medications    Medications Prior to Admission  Medication Sig Dispense Refill  . ALPRAZolam (XANAX) 0.25 MG tablet Take 0.25 mg by mouth at bedtime as needed for sleep.      Marland Kitchen aspirin 81 MG tablet Take 81 mg by mouth daily.      . cycloSPORINE (RESTASIS) 0.05 % ophthalmic emulsion Place 1 drop into both eyes daily.       . folic acid (FOLVITE) 1 MG tablet Take 1 mg by mouth daily.      . hydroxychloroquine (PLAQUENIL) 200 MG tablet Take 400 mg by mouth daily. Pt  prescribed this dosage, but currently taking one pill unit eye appt      . losartan-hydrochlorothiazide (HYZAAR) 100-12.5 MG per tablet Take 1 tablet by mouth daily.      . methotrexate (RHEUMATREX) 2.5 MG tablet Take 5-7.5 mg by mouth 2 (two) times a week.       . NONFORMULARY OR COMPOUNDED ITEM Estradiol .02% 1 ML Prefilled Applicator Sig: apply vaginally twice a week #90 Day Supply with 4 refills  1 each  4  . peg 3350 powder (MOVIPREP) 100 G SOLR Take 1 kit (200 g total) by mouth once.  1 kit  0  . NAPROXEN PO Take 1 tablet by mouth daily as needed. OTC        No results found for this or any previous visit (from the past 48 hour(s)). No results found.  ROS  Blood pressure 116/70, pulse 74, temperature 98.1 F (36.7 C), temperature source Oral, resp. rate 18, height 5' 6.5" (1.689 m), weight 168 lb (76.204 kg), SpO2 100.00%. Physical Exam  Constitutional: She appears well-developed and well-nourished.  HENT:  Mouth/Throat: Oropharynx is clear and moist.  Eyes: Conjunctivae are normal. No scleral icterus.  Neck: No thyromegaly present.  Cardiovascular: Normal rate, regular rhythm and normal heart sounds.   No murmur heard. Respiratory: Effort normal and breath sounds normal.  GI: Soft. She exhibits no distension and no mass. There is no tenderness.  Musculoskeletal: She exhibits no edema.  Lymphadenopathy:    She has no cervical adenopathy.  Neurological: She is alert.  Skin: Skin is warm and dry.     Assessment/Plan Average risk screening colonoscopy.  REHMAN,NAJEEB U 08/24/2013, 1:19 PM

## 2013-08-24 NOTE — Op Note (Addendum)
COLONOSCOPY PROCEDURE REPORT  PATIENT:  Bethany Grant  MR#:  [id number] Birthdate:  03-13-1941, 73 y.o., female Endoscopist:  Dr. Rogene Houston, MD Referred By:  Dr. Wende Neighbors, MD  Procedure Date: 08/24/2013  Procedure:   Colonoscopy  Indications:  Patient is 73 year old Caucasian female is undergoing average risk screening colonoscopy. Her last exam was in 2004.  Informed Consent:  The procedure and risks were reviewed with the patient and informed consent was obtained.  Medications:  Demerol 50 mg IV Versed 8 mg IV Ondansetron 4 mg IV  Description of procedure:  After a digital rectal exam was performed, that colonoscope was advanced from the anus through the rectum and colon to the area of the cecum, ileocecal valve and appendiceal orifice. The cecum was deeply intubated. These structures were well-seen and photographed for the record. From the level of the cecum and ileocecal valve, the scope was slowly and cautiously withdrawn. The mucosal surfaces were carefully surveyed utilizing scope tip to flexion to facilitate fold flattening as needed. The scope was pulled down into the rectum where a thorough exam including retroflexion was performed.  Findings:   Prep satisfactory. Single diverticulum in sigmoid colon. No other mucosal abnormalities noted. Rectal mucosa and anal rectal junction are unremarkable.   Therapeutic/Diagnostic Maneuvers Performed:   None  Complications:  None  Cecal Withdrawal Time:  9 minutes  Impression:  Normal colonoscopy except single diverticulum at sigmoid colon.  Recommendations:  Standard instructions given. Mycolog-II cream to be applied to perianal area twice a day for 2 weeks and when necessary(pruritus ani). Next screening exam in 10 years.  [name]  08/24/2013 2:06 PM  CC: Dr. Delphina Cahill, MD & Dr. Rayne Du ref. provider found

## 2013-08-28 ENCOUNTER — Encounter (HOSPITAL_COMMUNITY): Payer: Self-pay | Admitting: Internal Medicine

## 2013-09-04 ENCOUNTER — Telehealth (INDEPENDENT_AMBULATORY_CARE_PROVIDER_SITE_OTHER): Payer: Self-pay | Admitting: *Deleted

## 2013-09-04 NOTE — Telephone Encounter (Signed)
Bethany Grant would like to get her TCS results. The return phone number is [phone].

## 2013-09-04 NOTE — Telephone Encounter (Signed)
Findings:  Prep satisfactory.  Single diverticulum in sigmoid colon.  No other mucosal abnormalities noted.  Rectal mucosa and anal rectal junction are unremarkable.  Therapeutic/Diagnostic Maneuvers Performed: None  Complications: None  Cecal Withdrawal Time: 9 minutes  Impression:  Normal colonoscopy except single diverticulum at sigmoid colon.  Recommendations:  Standard instructions given.  Mycolog-II cream to be applied to perianal area twice a day for 2 weeks and when necessary(pruritus ani).  Next screening exam in 10 years.

## 2013-09-15 NOTE — Telephone Encounter (Signed)
Patient called . She was at work, results reviewed with her husband.

## 2013-10-12 ENCOUNTER — Ambulatory Visit (INDEPENDENT_AMBULATORY_CARE_PROVIDER_SITE_OTHER): Payer: Medicare Other | Admitting: Pulmonary Disease

## 2013-10-12 ENCOUNTER — Ambulatory Visit (INDEPENDENT_AMBULATORY_CARE_PROVIDER_SITE_OTHER)
Admission: RE | Admit: 2013-10-12 | Discharge: 2013-10-12 | Disposition: A | Discharge status: Home / Self Care | Payer: Medicare Other | Source: Ambulatory Visit | Attending: Pulmonary Disease | Admitting: Pulmonary Disease

## 2013-10-12 ENCOUNTER — Encounter: Payer: Self-pay | Admitting: Pulmonary Disease

## 2013-10-12 VITALS — BP 112/62 | HR 66 | Ht 67.0 in | Wt 168.0 lb

## 2013-10-12 DIAGNOSIS — M0510 Rheumatoid lung disease with rheumatoid arthritis of unspecified site: Secondary | ICD-10-CM

## 2013-10-12 DIAGNOSIS — R9389 Abnormal findings on diagnostic imaging of other specified body structures: Secondary | ICD-10-CM

## 2013-10-12 DIAGNOSIS — R918 Other nonspecific abnormal finding of lung field: Secondary | ICD-10-CM

## 2013-10-12 DIAGNOSIS — R930 Abnormal findings on diagnostic imaging of skull and head, not elsewhere classified: Secondary | ICD-10-CM

## 2013-10-12 NOTE — Assessment & Plan Note (Signed)
She had previous infiltrative changes on CT chest likely related to rheumatoid arthritis.  She does not have respiratory symptoms, and her CXR has been stable.  She has a strong family history of lung cancer and would like to continue radiographic follow up.  Will have her f/u in one year with repeat chest xray.

## 2013-10-12 NOTE — Progress Notes (Signed)
Chief Complaint  Patient presents with  . Follow-up    Breathing is doing well. Denies chest tightness, SOB or coughing.   CC: Bethany Grant  History of Present Illness: Bethany Grant is a 73 y.o. female former smoker with abnormal chest xray in setting of rheumatoid arthritis, and family history of lung cancer.  She is not having any trouble with her breathing.  She denies cough, wheeze, sputum, hemoptysis, or fever.  She tries to stay active.  She remains on methotrexate and plaquenil.  She did not want to use other biologics due to concern for side effects.  TESTS: CT chest 01/25/08 >> 4 mm Lt lower lobe nodule, hazy reticular infiltrate LLL CT chest 04/18/08 >> increase in LLL reticular infiltrate Bronchoscopy Oct 2009>>Pneumatocyte atypia suggestive of obstructive pneumonitis, can be seen with RA in the lungs PFT 06/19/08 >> FEV1 2.58(107%), FEV1% 71, TLC 6.07(111%), DLCO 96%, no BD CT chest 05/23/09 >> 3.8 x 3.6 cm ascending aorta, scoliosis, decreased size LLL nodule, reticular changes LLL decreased  She  has a past medical history of Endometriosis; Fibroid; DUB (dysfunctional uterine bleeding); Rheumatoid arthritis(714.0); Hypercholesterolemia; and Hypertension.  She  has past surgical history that includes Abdominal hysterectomy (1991); Hemorrhoid surgery; Hand surgery; Foot surgery; Breast surgery (1988); Bartholin gland cyst excision (1980); Oophorectomy; Colonoscopy; and Colonoscopy (N/A, 08/24/2013).   Outpatient Encounter Prescriptions as of 10/12/2013  Medication Sig  . ALPRAZolam (XANAX) 0.25 MG tablet Take 0.25 mg by mouth at bedtime as needed for sleep.  Marland Kitchen aspirin 81 MG tablet Take 81 mg by mouth daily.  . cycloSPORINE (RESTASIS) 0.05 % ophthalmic emulsion Place 1 drop into both eyes daily.   . folic acid (FOLVITE) 1 MG tablet Take 1 mg by mouth daily.  . hydroxychloroquine (PLAQUENIL) 200 MG tablet Take 400 mg by mouth daily.   Marland Kitchen losartan-hydrochlorothiazide  (HYZAAR) 100-12.5 MG per tablet Take 1 tablet by mouth daily.  . methotrexate (RHEUMATREX) 2.5 MG tablet Take 5-7.5 mg by mouth 2 (two) times a week.   Marland Kitchen NAPROXEN PO Take 1 tablet by mouth daily as needed. OTC  . NONFORMULARY OR COMPOUNDED ITEM Estradiol .02% 1 ML Prefilled Applicator Sig: apply vaginally twice a week #90 Day Supply with 4 refills  . nystatin-triamcinolone (MYCOLOG II) cream Apply 1 application topically 2 (two) times daily. Applied twice daily as directed for two weeks and then on an as-needed basis    Allergies  Allergen Reactions  . Other Nausea And Vomiting    Doesn't do well with pain medications    Physical Exam:  General: no distress HEENT: no sinus tenderness, no oral exudate, no LAN Cardiac: regular, no murmur Chest: no wheeze/rales Abd: soft, non tender Ext: changes of RA in hands affecting MCP joints b/l Skin: no rashes Psych: Normal mood/behavior  Dg Chest 2 View  10/12/2013   CLINICAL DATA:  History of hypertension, tobacco use  EXAM: CHEST  2 VIEW  COMPARISON:  IR US GUIDE BX ASP/DRAIN dated 02/16/2013; DG CHEST 2 VIEW dated 01/21/2011; DG CHEST 2 VIEW dated 01/25/2012  FINDINGS: The heart size and mediastinal contours are within normal limits. Both lungs are clear. There is S-shaped scoliosis of the thoracic spine.  IMPRESSION: No active cardiopulmonary disease.   Electronically Signed   By: Margaree Mackintosh M.D.   On: 10/12/2013 16:18    Assessment/Plan:  Bethany Mires, MD [facility] Pager:  [phone] 10/12/2013, 4:21 PM

## 2013-10-12 NOTE — Patient Instructions (Signed)
Follow up in 1 year with chest xray 

## 2013-11-10 ENCOUNTER — Other Ambulatory Visit: Payer: Self-pay | Admitting: Orthopedic Surgery

## 2013-11-28 ENCOUNTER — Encounter (HOSPITAL_BASED_OUTPATIENT_CLINIC_OR_DEPARTMENT_OTHER): Payer: Self-pay | Admitting: *Deleted

## 2013-11-28 ENCOUNTER — Encounter (HOSPITAL_BASED_OUTPATIENT_CLINIC_OR_DEPARTMENT_OTHER)
Admission: RE | Admit: 2013-11-28 | Discharge: 2013-11-28 | Disposition: A | Discharge status: Home / Self Care | Payer: Medicare Other | Source: Ambulatory Visit | Attending: Orthopedic Surgery | Admitting: Orthopedic Surgery

## 2013-11-28 LAB — BASIC METABOLIC PANEL
BUN: 14 mg/dL (ref 6–23)
CO2: 27 mEq/L (ref 19–32)
CREATININE: 1.02 mg/dL (ref 0.50–1.10)
Calcium: 8.7 mg/dL (ref 8.4–10.5)
Chloride: 98 mEq/L (ref 96–112)
GFR calc non Af Amer: 54 mL/min — ABNORMAL LOW (ref >90)
GFR, EST AFRICAN AMERICAN: 63 mL/min — AB (ref >90)
Glucose, Bld: 87 mg/dL (ref 70–99)
Potassium: 4.5 mEq/L (ref 3.7–5.3)
SODIUM: 136 meq/L — AB (ref 137–147)

## 2013-11-28 NOTE — Pre-Procedure Instructions (Signed)
Per Kym Groom, RN - EKG was reviewed by Dr. Al Corpus; pt. is OK for surgery, but is referred to PCP (Dr. Nevada Crane in [place]) for followup.  Pt. states she will call Dr. Nevada Crane today when she arrives home.

## 2013-11-29 ENCOUNTER — Ambulatory Visit (HOSPITAL_BASED_OUTPATIENT_CLINIC_OR_DEPARTMENT_OTHER): Payer: Medicare Other | Admitting: Anesthesiology

## 2013-11-29 ENCOUNTER — Encounter (HOSPITAL_BASED_OUTPATIENT_CLINIC_OR_DEPARTMENT_OTHER): Payer: Medicare Other | Admitting: Anesthesiology

## 2013-11-29 ENCOUNTER — Encounter (HOSPITAL_BASED_OUTPATIENT_CLINIC_OR_DEPARTMENT_OTHER): Payer: Self-pay | Admitting: *Deleted

## 2013-11-29 ENCOUNTER — Ambulatory Visit (HOSPITAL_BASED_OUTPATIENT_CLINIC_OR_DEPARTMENT_OTHER)
Admission: RE | Admit: 2013-11-29 | Discharge: 2013-11-29 | Disposition: A | Discharge status: Home / Self Care | Payer: Medicare Other | Source: Ambulatory Visit | Attending: Orthopedic Surgery | Admitting: Orthopedic Surgery

## 2013-11-29 ENCOUNTER — Encounter (HOSPITAL_BASED_OUTPATIENT_CLINIC_OR_DEPARTMENT_OTHER)
Admission: RE | Disposition: A | Discharge status: Home / Self Care | Payer: Self-pay | Source: Ambulatory Visit | Attending: Orthopedic Surgery

## 2013-11-29 DIAGNOSIS — Z7982 Long term (current) use of aspirin: Secondary | ICD-10-CM | POA: Insufficient documentation

## 2013-11-29 DIAGNOSIS — Z79899 Other long term (current) drug therapy: Secondary | ICD-10-CM | POA: Insufficient documentation

## 2013-11-29 DIAGNOSIS — M7020 Olecranon bursitis, unspecified elbow: Secondary | ICD-10-CM | POA: Insufficient documentation

## 2013-11-29 DIAGNOSIS — E7800 Pure hypercholesterolemia, unspecified: Secondary | ICD-10-CM | POA: Insufficient documentation

## 2013-11-29 DIAGNOSIS — G5600 Carpal tunnel syndrome, unspecified upper limb: Secondary | ICD-10-CM | POA: Insufficient documentation

## 2013-11-29 DIAGNOSIS — Z87891 Personal history of nicotine dependence: Secondary | ICD-10-CM | POA: Insufficient documentation

## 2013-11-29 DIAGNOSIS — I10 Essential (primary) hypertension: Secondary | ICD-10-CM | POA: Insufficient documentation

## 2013-11-29 DIAGNOSIS — M069 Rheumatoid arthritis, unspecified: Secondary | ICD-10-CM | POA: Insufficient documentation

## 2013-11-29 HISTORY — PX: TENOSYNOVECTOMY: SHX6110

## 2013-11-29 HISTORY — PX: OLECRANON BURSECTOMY: SHX2097

## 2013-11-29 SURGERY — BURSECTOMY, ELBOW
Anesthesia: General | Site: Wrist | Laterality: Right

## 2013-11-29 MED ORDER — FENTANYL CITRATE 0.05 MG/ML IJ SOLN
50.0000 ug | INTRAMUSCULAR | Status: DC | PRN
  Administered 2013-11-29: 100 ug via INTRAVENOUS

## 2013-11-29 MED ORDER — LACTATED RINGERS IV SOLN
INTRAVENOUS | Status: DC
  Administered 2013-11-29 (×2): via INTRAVENOUS

## 2013-11-29 MED ORDER — MIDAZOLAM HCL 2 MG/2ML IJ SOLN
1.0000 mg | INTRAMUSCULAR | Status: DC | PRN
  Administered 2013-11-29: 2 mg via INTRAVENOUS

## 2013-11-29 MED ORDER — CEFAZOLIN SODIUM-DEXTROSE 2-3 GM-% IV SOLR
2.0000 g | INTRAVENOUS | Status: AC
  Administered 2013-11-29: 2 g via INTRAVENOUS

## 2013-11-29 MED ORDER — ONDANSETRON HCL 4 MG/2ML IJ SOLN: 4.0000 mg | Freq: Once | INTRAMUSCULAR | Status: DC | PRN

## 2013-11-29 MED ORDER — CEFAZOLIN SODIUM-DEXTROSE 2-3 GM-% IV SOLR: 2.0000 g | INTRAVENOUS | Status: DC

## 2013-11-29 MED ORDER — DEXAMETHASONE SODIUM PHOSPHATE 10 MG/ML IJ SOLN
INTRAMUSCULAR | Status: DC | PRN
  Administered 2013-11-29: 10 mg via INTRAVENOUS

## 2013-11-29 MED ORDER — FENTANYL CITRATE 0.05 MG/ML IJ SOLN
INTRAMUSCULAR | Status: AC
  Filled 2013-11-29: qty 2

## 2013-11-29 MED ORDER — BUPIVACAINE-EPINEPHRINE (PF) 0.5% -1:200000 IJ SOLN
INTRAMUSCULAR | Status: DC | PRN
  Administered 2013-11-29: 20 mL

## 2013-11-29 MED ORDER — CHLORHEXIDINE GLUCONATE 4 % EX LIQD: 60.0000 mL | Freq: Once | CUTANEOUS | Status: DC

## 2013-11-29 MED ORDER — PROPOFOL 10 MG/ML IV BOLUS
INTRAVENOUS | Status: DC | PRN
  Administered 2013-11-29: 150 mg via INTRAVENOUS

## 2013-11-29 MED ORDER — FENTANYL CITRATE 0.05 MG/ML IJ SOLN
INTRAMUSCULAR | Status: AC
  Filled 2013-11-29: qty 4

## 2013-11-29 MED ORDER — LIDOCAINE HCL (CARDIAC) 20 MG/ML IV SOLN
INTRAVENOUS | Status: DC | PRN
  Administered 2013-11-29: 50 mg via INTRAVENOUS

## 2013-11-29 MED ORDER — MIDAZOLAM HCL 2 MG/2ML IJ SOLN
INTRAMUSCULAR | Status: AC
  Filled 2013-11-29: qty 2

## 2013-11-29 MED ORDER — CEFAZOLIN SODIUM-DEXTROSE 2-3 GM-% IV SOLR
INTRAVENOUS | Status: AC
  Filled 2013-11-29: qty 50

## 2013-11-29 MED ORDER — EPHEDRINE SULFATE 50 MG/ML IJ SOLN
INTRAMUSCULAR | Status: DC | PRN
  Administered 2013-11-29: 10 mg via INTRAVENOUS

## 2013-11-29 MED ORDER — ONDANSETRON HCL 4 MG/2ML IJ SOLN
INTRAMUSCULAR | Status: DC | PRN
  Administered 2013-11-29: 4 mg via INTRAVENOUS

## 2013-11-29 MED ORDER — FENTANYL CITRATE 0.05 MG/ML IJ SOLN: 25.0000 ug | INTRAMUSCULAR | Status: DC | PRN

## 2013-11-29 MED ORDER — PROMETHAZINE HCL 25 MG RE SUPP: 25.0000 mg | Freq: Four times a day (QID) | RECTAL | Status: DC | PRN

## 2013-11-29 MED ORDER — TRAMADOL HCL 50 MG PO TABS: 50.0000 mg | ORAL_TABLET | Freq: Four times a day (QID) | ORAL | Status: DC | PRN

## 2013-11-29 SURGICAL SUPPLY — 77 items
BAG DECANTER FOR FLEXI CONT (MISCELLANEOUS) IMPLANT
BLADE 15 SAFETY STRL DISP (BLADE) ×3 IMPLANT
BLADE MINI RND TIP GREEN BEAV (BLADE) ×3 IMPLANT
BNDG COHESIVE 3X5 TAN STRL LF (GAUZE/BANDAGES/DRESSINGS) ×6 IMPLANT
BNDG ESMARK 4X9 LF (GAUZE/BANDAGES/DRESSINGS) ×3 IMPLANT
BNDG GAUZE ELAST 4 BULKY (GAUZE/BANDAGES/DRESSINGS) ×3 IMPLANT
CHLORAPREP W/TINT 26ML (MISCELLANEOUS) ×3 IMPLANT
CORDS BIPOLAR (ELECTRODE) ×3 IMPLANT
COVER MAYO STAND STRL (DRAPES) ×3 IMPLANT
COVER TABLE BACK 60X90 (DRAPES) ×3 IMPLANT
CUFF TOURNIQUET SINGLE 18IN (TOURNIQUET CUFF) ×3 IMPLANT
DECANTER SPIKE VIAL GLASS SM (MISCELLANEOUS) IMPLANT
DRAIN TLS ROUND 10FR (DRAIN) IMPLANT
DRAPE EXTREMITY T 121X128X90 (DRAPE) ×3 IMPLANT
DRAPE SURG 17X23 STRL (DRAPES) ×3 IMPLANT
DRSG PAD ABDOMINAL 8X10 ST (GAUZE/BANDAGES/DRESSINGS) ×6 IMPLANT
GAUZE SPONGE 4X4 12PLY STRL (GAUZE/BANDAGES/DRESSINGS) ×3 IMPLANT
GAUZE SPONGE 4X4 16PLY XRAY LF (GAUZE/BANDAGES/DRESSINGS) IMPLANT
GAUZE XEROFORM 1X8 LF (GAUZE/BANDAGES/DRESSINGS) ×3 IMPLANT
GLOVE BIOGEL M 7.0 STRL (GLOVE) ×6 IMPLANT
GLOVE BIOGEL PI IND STRL 7.0 (GLOVE) ×2 IMPLANT
GLOVE BIOGEL PI IND STRL 7.5 (GLOVE) ×2 IMPLANT
GLOVE BIOGEL PI IND STRL 8.5 (GLOVE) ×2 IMPLANT
GLOVE BIOGEL PI INDICATOR 7.0 (GLOVE) ×1
GLOVE BIOGEL PI INDICATOR 7.5 (GLOVE) ×1
GLOVE BIOGEL PI INDICATOR 8.5 (GLOVE) ×1
GLOVE ECLIPSE 6.5 STRL STRAW (GLOVE) ×3 IMPLANT
GLOVE SURG ORTHO 8.0 STRL STRW (GLOVE) ×3 IMPLANT
GOWN STRL REUS W/ TWL LRG LVL3 (GOWN DISPOSABLE) ×4 IMPLANT
GOWN STRL REUS W/TWL LRG LVL3 (GOWN DISPOSABLE) ×4
GOWN STRL REUS W/TWL XL LVL3 (GOWN DISPOSABLE) ×3 IMPLANT
K-WIRE .035X4 (WIRE) IMPLANT
LOOP VESSEL MAXI BLUE (MISCELLANEOUS) IMPLANT
NDL SUT 6 .5 CRC .975X.05 MAYO (NEEDLE) IMPLANT
NEEDLE 27GAX1X1/2 (NEEDLE) IMPLANT
NEEDLE HYPO 22GX1.5 SAFETY (NEEDLE) IMPLANT
NEEDLE KEITH (NEEDLE) IMPLANT
NEEDLE MAYO TAPER (NEEDLE)
NS IRRIG 1000ML POUR BTL (IV SOLUTION) ×3 IMPLANT
PACK BASIN DAY SURGERY FS (CUSTOM PROCEDURE TRAY) ×3 IMPLANT
PAD CAST 3X4 CTTN HI CHSV (CAST SUPPLIES) ×2 IMPLANT
PAD CAST 4YDX4 CTTN HI CHSV (CAST SUPPLIES) ×2 IMPLANT
PADDING CAST ABS 3INX4YD NS (CAST SUPPLIES)
PADDING CAST ABS 4INX4YD NS (CAST SUPPLIES) ×1
PADDING CAST ABS COTTON 3X4 (CAST SUPPLIES) IMPLANT
PADDING CAST ABS COTTON 4X4 ST (CAST SUPPLIES) ×2 IMPLANT
PADDING CAST COTTON 3X4 STRL (CAST SUPPLIES) ×2
PADDING CAST COTTON 4X4 STRL (CAST SUPPLIES) ×3
SLEEVE SCD COMPRESS KNEE MED (MISCELLANEOUS) ×3 IMPLANT
SPLINT PLASTER CAST XFAST 3X15 (CAST SUPPLIES) IMPLANT
SPLINT PLASTER XTRA FASTSET 3X (CAST SUPPLIES)
STOCKINETTE 4X48 STRL (DRAPES) ×3 IMPLANT
SUT BONE WAX W31G (SUTURE) IMPLANT
SUT CHROMIC 4 0 P 3 18 (SUTURE) IMPLANT
SUT ETHIBOND 3-0 V-5 (SUTURE) IMPLANT
SUT MERSILENE 2.0 SH NDLE (SUTURE) IMPLANT
SUT MERSILENE 3 0 FS 1 (SUTURE) IMPLANT
SUT MERSILENE 4 0 P 3 (SUTURE) IMPLANT
SUT PROLENE 2 0 SH DA (SUTURE) IMPLANT
SUT SILK 4 0 PS 2 (SUTURE) IMPLANT
SUT STEEL 4 0 V 26 (SUTURE) IMPLANT
SUT VIC AB 3-0 PS1 18 (SUTURE)
SUT VIC AB 3-0 PS1 18XBRD (SUTURE) IMPLANT
SUT VIC AB 4-0 P-3 18XBRD (SUTURE) IMPLANT
SUT VIC AB 4-0 P3 18 (SUTURE)
SUT VIC AB 4-0 RB1 27 (SUTURE)
SUT VIC AB 4-0 RB1 27X BRD (SUTURE) IMPLANT
SUT VICRYL 4-0 PS2 18IN ABS (SUTURE) ×3 IMPLANT
SUT VICRYL RAPID 5 0 P 3 (SUTURE) IMPLANT
SUT VICRYL RAPIDE 4/0 PS 2 (SUTURE) ×12 IMPLANT
SWAB COLLECTION DEVICE MRSA (MISCELLANEOUS) IMPLANT
SYR BULB 3OZ (MISCELLANEOUS) ×3 IMPLANT
SYR CONTROL 10ML LL (SYRINGE) ×3 IMPLANT
TOWEL OR 17X24 6PK STRL BLUE (TOWEL DISPOSABLE) ×3 IMPLANT
TUBE ANAEROBIC SPECIMEN COL (MISCELLANEOUS) IMPLANT
TUBE FEEDING 5FR 15 INCH (TUBING) IMPLANT
UNDERPAD 30X30 INCONTINENT (UNDERPADS AND DIAPERS) ×3 IMPLANT

## 2013-11-29 NOTE — Brief Op Note (Signed)
11/29/2013  1:11 PM  PATIENT:  Bethany Grant  73 y.o. female  PRE-OPERATIVE DIAGNOSIS:  RIGHT CARPAL TUNNEL SYNDROME OLECRANON BURSA RIGHT   POST-OPERATIVE DIAGNOSIS:  RIGHT CARPAL TUNNEL SYNDROME OLECRANON BURSA RIGHT   PROCEDURE:  Procedure(s): EXCISION OLECRANON BURSA RIGHT  (Right) TENOSYNOVECTOMY FLEXOR TENDON RIGHT WRIST  (Right)  SURGEON:  Surgeon(s) and Role:    * Wynonia Sours, MD - Primary  PHYSICIAN ASSISTANT:   ASSISTANTS: none   ANESTHESIA:   regional and general  EBL:  Total I/O In: 1200 [I.V.:1200] Out: -   BLOOD ADMINISTERED:none  DRAINS: none   LOCAL MEDICATIONS USED:  NONE  SPECIMEN:  Excision  DISPOSITION OF SPECIMEN:  PATHOLOGY  COUNTS:  YES  TOURNIQUET:   Total Tourniquet Time Documented: Upper Arm (Right) - 53 minutes Total: Upper Arm (Right) - 53 minutes   DICTATION: .Other Dictation: Dictation Number [id number]  PLAN OF CARE: Discharge to home after PACU  PATIENT DISPOSITION:  PACU - hemodynamically stable.

## 2013-11-29 NOTE — Transfer of Care (Signed)
Immediate Anesthesia Transfer of Care Note  Patient: Bethany Grant  Procedure(s) Performed: Procedure(s): EXCISION OLECRANON BURSA RIGHT  (Right) TENOSYNOVECTOMY FLEXOR TENDON RIGHT WRIST  (Right)  Patient Location: PACU  Anesthesia Type:General  Level of Consciousness: awake and alert   Airway & Oxygen Therapy: Patient Spontanous Breathing and Patient connected to face mask oxygen  Post-op Assessment: Report given to PACU RN and Post -op Vital signs reviewed and stable  Post vital signs: Reviewed and stable  Complications: No apparent anesthesia complications

## 2013-11-29 NOTE — Op Note (Signed)
Dictation Number [id number]

## 2013-11-29 NOTE — Anesthesia Procedure Notes (Addendum)
Anesthesia Regional Block:  Supraclavicular block  Pre-Anesthetic Checklist: ,, timeout performed, Correct Patient, Correct Site, Correct Laterality, Correct Procedure, Correct Position, site marked, Risks and benefits discussed,  Surgical consent,  Pre-op evaluation,  At surgeon's request and post-op pain management  Laterality: Right and Upper  Prep: chloraprep       Needles:  Injection technique: Single-shot  Needle Type: Echogenic Stimulator Needle     Needle Length: 5cm 5 cm Needle Gauge: 21 and 21 G    Additional Needles:  Procedures: ultrasound guided (picture in chart) Supraclavicular block Narrative:  Start time: 11/29/2013 11:45 AM End time: 11/29/2013 11:55 AM Injection made incrementally with aspirations every 5 mL.  Performed by: Personally  Anesthesiologist: Lorrene Reid   Procedure Name: LMA Insertion Date/Time: 11/29/2013 12:09 PM Performed by: Lieutenant Diego Pre-anesthesia Checklist: Patient identified, Emergency Drugs available, Suction available and Patient being monitored Patient Re-evaluated:Patient Re-evaluated prior to inductionOxygen Delivery Method: Circle System Utilized Preoxygenation: Pre-oxygenation with 100% oxygen Intubation Type: IV induction Ventilation: Mask ventilation without difficulty LMA: LMA inserted LMA Size: 4.0 Number of attempts: 1 Airway Equipment and Method: bite block Placement Confirmation: positive ETCO2 and breath sounds checked- equal and bilateral Tube secured with: Tape Dental Injury: Teeth and Oropharynx as per pre-operative assessment

## 2013-11-29 NOTE — H&P (Signed)
Bethany Grant is74 years-old.  She is left-hand dominant.  She is referred by Dr. Charlestine Night.  She has rheumatoid arthritis.  She has been operated on in the past.  She states that her hands have been growing progressively worse over the past two years. She complains of an intermittent, moderate ache in the hand, but decreased function due to the position of the metacarpophalangeal joints.  She is presently on methotrexate, folic acid.  She has no history of diabetes. She has had her nerve conductions done revealing bilateral carpal tunnel syndrome with a motor delay of 4.2 on the left, 4.3 on the right, sensory delay of 2.4 bilaterally. Amplitudes are within normal limits.   ALLERGIES:   None.  MEDICATIONS:    Hydroxychloroquine methotrexate, alprazolam, losartan, vitamin D, folic acid and baba aspirin.    SURGICAL HISTORY:     Hand surgery, hysterectomy, foot surgery, Bartholin gland and hemorrhoidectomy.  FAMILY MEDICAL HISTORY:    Negative.  SOCIAL HISTORY:     She does not smoke or drink.  She is married, retired, but works two days a week.  REVIEW OF SYSTEMS:    Positive for blood pressure, rash, easy bruising, otherwise negative. Bethany Grant is an 73 y.o. female.   Chief Complaint: olecranon bursa and cts right  HPI: see above  Past Medical History  Diagnosis Date  . Endometriosis   . Fibroid   . DUB (dysfunctional uterine bleeding)   . Rheumatoid arthritis(714.0)   . Hypercholesterolemia   . Hypertension     Past Surgical History  Procedure Laterality Date  . Abdominal hysterectomy  1991    TAH/BSO  . Hemorrhoid surgery    . Hand surgery    . Foot surgery      2 TIMES  . Breast surgery  1988    BREAST CYST  . Bartholin gland cyst excision  1980    RIGHT  . Oophorectomy      BSO  . Colonoscopy    . Colonoscopy N/A 08/24/2013    Procedure: COLONOSCOPY;  Surgeon: Rogene Houston, MD;  [facility];  Service: Endoscopy;  Laterality: N/A;  100    Family  History  Problem Relation Age of Onset  . Cancer Mother     LUNG  . Hypertension Sister   . Cancer Sister     LUNG, THYROID, UTERINE  . Cancer Brother     LUNG  . Cancer Sister     Lung cancer  . Colon cancer Neg Hx    Social History:  reports that she quit smoking about 9 years ago. Her smoking use included Cigarettes. She has a 24 pack-year smoking history. She has never used smokeless tobacco. She reports that she does not drink alcohol or use illicit drugs.  Allergies:  Allergies  Allergen Reactions  . Other Nausea And Vomiting    Doesn't do well with pain  Medications, sensitivity to tape    Medications Prior to Admission  Medication Sig Dispense Refill  . ALPRAZolam (XANAX) 0.25 MG tablet Take 0.25 mg by mouth at bedtime as needed for sleep.      Marland Kitchen aspirin 81 MG tablet Take 81 mg by mouth daily.      . cycloSPORINE (RESTASIS) 0.05 % ophthalmic emulsion Place 1 drop into both eyes daily.       . folic acid (FOLVITE) 1 MG tablet Take 1 mg by mouth daily.      . hydroxychloroquine (PLAQUENIL) 200 MG tablet Take 400  mg by mouth daily.       Marland Kitchen losartan-hydrochlorothiazide (HYZAAR) 100-12.5 MG per tablet Take 1 tablet by mouth daily.      . methotrexate (RHEUMATREX) 2.5 MG tablet Take 5-7.5 mg by mouth 2 (two) times a week.       Marland Kitchen VITAMIN D, CHOLECALCIFEROL, PO Take by mouth once.      . NONFORMULARY OR COMPOUNDED ITEM Estradiol .02% 1 ML Prefilled Applicator Sig: apply vaginally twice a week #90 Day Supply with 4 refills  1 each  4  . nystatin-triamcinolone (MYCOLOG II) cream Apply 1 application topically 2 (two) times daily. Applied twice daily as directed for two weeks and then on an as-needed basis  30 g  1    Results for orders placed during the hospital encounter of 11/29/13 (from the past 48 hour(s))  BASIC METABOLIC PANEL     Status: Abnormal   Collection Time    11/28/13  2:30 PM      Result Value Ref Range   Sodium 136 (*) 137 - 147 mEq/L   Potassium 4.5  3.7 -  5.3 mEq/L   Chloride 98  96 - 112 mEq/L   CO2 27  19 - 32 mEq/L   Glucose, Bld 87  70 - 99 mg/dL   BUN 14  6 - 23 mg/dL   Creatinine, Ser 1.02  0.50 - 1.10 mg/dL   Calcium 8.7  8.4 - 10.5 mg/dL   GFR calc non Af Amer 54 (*) >90 mL/min   GFR calc Af Amer 63 (*) >90 mL/min   Comment: (NOTE)     The eGFR has been calculated using the CKD EPI equation.     This calculation has not been validated in all clinical situations.     eGFR's persistently <90 mL/min signify possible Chronic Kidney     Disease.    No results found.   Pertinent items are noted in HPI.  Blood pressure 117/62, pulse 61, temperature 97 F (36.1 C), temperature source Oral, resp. rate 16, height 5' 7"  (1.702 m), weight 166 lb (75.297 kg), SpO2 100.00%.  General appearance: alert, cooperative and appears stated age Head: Normocephalic, without obvious abnormality Neck: no JVD Resp: clear to auscultation bilaterally Cardio: regular rate and rhythm, S1, S2 normal, no murmur, click, rub or gallop GI: soft, non-tender; bowel sounds normal; no masses,  no organomegaly Extremities: olecranon bursa right elbow Pulses: 2+ and symmetric Skin: Skin color, texture, turgor normal. No rashes or lesions Neurologic: Grossly normal Incision/Wound: na  Assessment/Plan We have discussed the possibility of release of the carpal tunnel with the rheumatoid arthritis we would recommend consideration for tenosynovectomy rather than carpal tunnel release. She would like to have the olecranon bursa excised on her right side. As such we would recommend excision of the olecranon bursa which may be more rheumatoid nodule. She is aware of the possibility of recurrence. We would recommend tenosynovectomy flexor tendons on her right side. The pre, peri and post op course are discussed along with risks and complications. She is aware there is no guarantee with surgery, possibility of infection, recurrence, injury to arteries, nerves and tendons,  incomplete relief of symptoms and dystrophy.  This will be scheduled at her convenience as an outpatient under regional anesthesia.  Wynonia Sours 11/29/2013, 10:48 AM

## 2013-11-29 NOTE — Discharge Instructions (Addendum)
[facility] Center Instructions °Hand Surgery ° °Wound Care: °Keep your hand elevated above the level of your heart.  Do not allow it to dangle by your side.  Keep the dressing dry and do not remove it unless your doctor advises you to do so.  He will usually change it at the time of your post-op visit.  Moving your fingers is advised to stimulate circulation but will depend on the site of your surgery.  If you have a splint applied, your doctor will advise you regarding movement. ° °Activity: °Do not drive or operate machinery today.  Rest today and then you may return to your normal activity and work as indicated by your physician. ° °Diet:  °Drink liquids today or eat a light diet.  You may resume a regular diet tomorrow.   ° °General expectations: °Pain for two to three days. °Fingers may become slightly swollen. ° °Call your doctor if any of the following occur: °Severe pain not relieved by pain medication. °Elevated temperature. °Dressing soaked with blood. °Inability to move fingers. °White or bluish color to fingers. ° ° °Post Anesthesia Home Care Instructions ° °Activity: °Get plenty of rest for the remainder of the day. A responsible adult should stay with you for 24 hours following the procedure.  °For the next 24 hours, DO NOT: °-Drive a car °-Operate machinery °-Drink alcoholic beverages °-Take any medication unless instructed by your physician °-Make any legal decisions or sign important papers. ° °Meals: °Start with liquid foods such as gelatin or soup. Progress to regular foods as tolerated. Avoid greasy, spicy, heavy foods. If nausea and/or vomiting occur, drink only clear liquids until the nausea and/or vomiting subsides. Call your physician if vomiting continues. ° °Special Instructions/Symptoms: °Your throat may feel dry or sore from the anesthesia or the breathing tube placed in your throat during surgery. If this causes discomfort, gargle with warm salt water. The discomfort should disappear within 24  hours. ° ° °Regional Anesthesia Blocks ° °1. Numbness or the inability to move the "blocked" extremity may last from 3-48 hours after placement. The length of time depends on the medication injected and your individual response to the medication. If the numbness is not going away after 48 hours, call your surgeon. ° °2. The extremity that is blocked will need to be protected until the numbness is gone and the  Strength has returned. Because you cannot feel it, you will need to take extra care to avoid injury. Because it may be weak, you may have difficulty moving it or using it. You may not know what position it is in without looking at it while the block is in effect. ° °3. For blocks in the legs and feet, returning to weight bearing and walking needs to be done carefully. You will need to wait until the numbness is entirely gone and the strength has returned. You should be able to move your leg and foot normally before you try and bear weight or walk. You will need someone to be with you when you first try to ensure you do not fall and possibly risk injury. ° °4. Bruising and tenderness at the needle site are common side effects and will resolve in a few days. ° °5. Persistent numbness or new problems with movement should be communicated to the surgeon or the [facility] ([phone])/ [facility] ([phone]). °

## 2013-11-29 NOTE — Anesthesia Postprocedure Evaluation (Signed)
  Anesthesia Post-op Note  Patient: Bethany Grant  Procedure(s) Performed: Procedure(s): EXCISION OLECRANON BURSA RIGHT  (Right) TENOSYNOVECTOMY FLEXOR TENDON RIGHT WRIST  (Right)  Patient Location: PACU  Anesthesia Type:GA combined with regional for post-op pain  Level of Consciousness: awake, alert  and oriented  Airway and Oxygen Therapy: Patient Spontanous Breathing  Post-op Pain: none  Post-op Assessment: Post-op Vital signs reviewed  Post-op Vital Signs: Reviewed  Last Vitals:  Filed Vitals:   11/29/13 1400  BP:   Pulse: 68  Temp:   Resp: 11    Complications: No apparent anesthesia complications

## 2013-11-29 NOTE — Anesthesia Preprocedure Evaluation (Signed)
Anesthesia Evaluation  Patient identified by MRN, date of birth, ID band Patient awake    Reviewed: Allergy & Precautions, H&P , NPO status , Patient's Chart, lab work & pertinent test results  Airway Mallampati: I  TM Distance: >3 FB Neck ROM: Full    Dental  (+) Teeth Intact, Dental Advisory Given   Pulmonary former smoker,  breath sounds clear to auscultation        Cardiovascular hypertension, Pt. on medications Rhythm:Regular Rate:Normal     Neuro/Psych    GI/Hepatic   Endo/Other    Renal/GU      Musculoskeletal   Abdominal   Peds  Hematology   Anesthesia Other Findings   Reproductive/Obstetrics                             Anesthesia Physical Anesthesia Plan  ASA: II  Anesthesia Plan: General   Post-op Pain Management:    Induction: Intravenous  Airway Management Planned: LMA  Additional Equipment:   Intra-op Plan:   Post-operative Plan: Extubation in OR  Informed Consent: I have reviewed the patients History and Physical, chart, labs and discussed the procedure including the risks, benefits and alternatives for the proposed anesthesia with the patient or authorized representative who has indicated his/her understanding and acceptance.   Dental advisory given  Plan Discussed with: CRNA, Anesthesiologist and Surgeon  Anesthesia Plan Comments:         Anesthesia Quick Evaluation  

## 2013-11-29 NOTE — Progress Notes (Signed)
Assisted Dr. Crews with right, ultrasound guided, supraclavicular block. Side rails up, monitors on throughout procedure. See vital signs in flow sheet. Tolerated Procedure well. 

## 2013-11-30 ENCOUNTER — Encounter (HOSPITAL_BASED_OUTPATIENT_CLINIC_OR_DEPARTMENT_OTHER): Payer: Self-pay | Admitting: Orthopedic Surgery

## 2013-11-30 NOTE — Op Note (Signed)
NAMENIRALI, MAGOUIRK                ACCOUNT NO.:  [id number]  MEDICAL RECORD NO.:  [id number]  LOCATION:                                 FACILITY:  PHYSICIAN:  Daryll Brod, M.D.       DATE OF BIRTH:  28-Nov-1940  DATE OF PROCEDURE:  11/29/2013 DATE OF DISCHARGE:  11/29/2013                              OPERATIVE REPORT   PREOPERATIVE DIAGNOSES:  Olecranon bursitis, right elbow.  Carpal tunnel syndrome with flexor tenosynovitis secondary to rheumatoid arthritis, right wrist.  POSTOPERATIVE DIAGNOSES:  Olecranon bursitis, right elbow.  Carpal tunnel syndrome with flexor tenosynovitis secondary to rheumatoid arthritis, right wrist.  OPERATION:  Excision of olecranon bursa with tenosynovectomy of flexor tendons, right wrist, superficialis profundus, flexor pollicis longus.  SURGEON:  Daryll Brod, M.D.  ANESTHESIA:  Supraclavicular block, general.  ANESTHESIOLOGIST:  Lorrene Reid, M.D.  HISTORY:  The patient is a 73 year old female with history of rheumatoid arthritis, carpal tunnel syndrome with a large olecranon bursa.  She has desired to get the olecranon bursa removed along with carpal tunnel release, and that the carpal tunnel syndrome has not responded to conservative treatment.  Nerve conductions are positive.  She is aware of risks and complications including infection; recurrence of injury to arteries, nerves, tendons; incomplete relief of symptoms; dystrophy; possibility of stiffness to her fingers.  In the preoperative area, the patient is seen, the extremity marked by both patient and surgeon, and antibiotic given.  DESCRIPTION OF PROCEDURE:  The patient was brought to the operating room where a general anesthetic was carried out without difficulty after a supraclavicular block was given in the preoperative area by Dr. Al Corpus. She was prepped using ChloraPrep, supine position with the right arm free.  A 3-minute dry time was allowed.  Time-out taken, confirming  the patient and procedure.  The elbow was attended to first.  Longitudinal incision was made in the posterior aspect, carried to the radial side and then back onto the posterior aspect of the distal upper arm, carried down through the subcutaneous tissue.  A large saccular fluid-filled mass was immediately encountered.  With blunt and sharp dissection, this was dissected free, measured approximately 6-7 cm in diameter.  The ulnar nerve was protected throughout the procedure.  Bleeders were electrocauterized with bipolar.  Specimen was sent to Pathology, was not opened.  The wound was copiously irrigated with saline.  The subcutaneous tissue was then closed with interrupted 4-0 Vicryl sutures, skin with interrupted 4-0 Vicryl Rapide sutures.  The tenosynovitis was attended to next.  A longitudinal incision was made in the right palm, carried down through the subcutaneous tissue, then to the ulnar side of the wrist and then onto the distal forearm, carried down through the subcutaneous tissue.  Bleeder was again electrocauterized with bipolar. The superficial arch identified, flexor tendon to the ring and little finger identified to the ulnar side of median nerve, the carpal retinaculum was incised with sharp dissection.  Right angle retractors were placed along with self-retaining retractors.  The dissection carried proximally.  Tenosynovial tissue was very thick and densely adherent to the flexor tendons.  With blunt and sharp dissection, this was dissected free for  the FPL, the superficialis independently and the profundus tendons as a unit.  The specimen was sent to Pathology.  The wound was irrigated and the subcutaneous tissue was closed with interrupted 4-0 Vicryl and the skin with interrupted 4-0 Vicryl Rapide. Prior to the procedure, the limb was exsanguinated with an Esmarch bandage.  Tourniquet was placed on the upper arm, was inflated to 250 mmHg.  On deflation of the  tourniquet, all fingers were immediately pinked.  She was taken to the recovery room for observation in satisfactory condition.  She will be discharged to home to return to the [facility] in 1 week, on Ultram.    ______________________________ Daryll Brod, M.D.   ______________________________ Daryll Brod, M.D.    GK/MEDQ  D:  11/29/2013  T:  11/30/2013  Job:  [id number]

## 2013-12-20 ENCOUNTER — Encounter: Payer: Self-pay | Admitting: Cardiology

## 2013-12-20 ENCOUNTER — Ambulatory Visit (INDEPENDENT_AMBULATORY_CARE_PROVIDER_SITE_OTHER): Payer: Medicare Other | Admitting: Cardiology

## 2013-12-20 VITALS — BP 134/77 | HR 62 | Ht 67.0 in | Wt 167.0 lb

## 2013-12-20 DIAGNOSIS — I4460 Unspecified fascicular block: Secondary | ICD-10-CM

## 2013-12-20 DIAGNOSIS — I444 Left anterior fascicular block: Secondary | ICD-10-CM | POA: Insufficient documentation

## 2013-12-20 DIAGNOSIS — I10 Essential (primary) hypertension: Secondary | ICD-10-CM

## 2013-12-20 DIAGNOSIS — M069 Rheumatoid arthritis, unspecified: Secondary | ICD-10-CM | POA: Insufficient documentation

## 2013-12-20 DIAGNOSIS — I452 Bifascicular block: Secondary | ICD-10-CM | POA: Insufficient documentation

## 2013-12-20 DIAGNOSIS — I4510 Unspecified right bundle-branch block: Secondary | ICD-10-CM

## 2013-12-20 NOTE — Progress Notes (Signed)
[name] [address], [address], [place]  [address] Phone: [phone] Fax:  [phone]  Date:  12/20/2013   ID:  Bethany Grant, DOB 10/19/1940, MRN [id number]  PCP:  Delphina Cahill, MD   History of Present Illness: Bethany Grant is a 73 y.o. female here for consultation to evaluate abnormal EKG during preoperative visits at [facility]. Currently with no symptoms. Has a history of hyperlipidemia, hypertension and rheumatoid arthritis.  EKG demonstrated RBBB, LAFB, bifascicular block. PACs noted. Asymptomatic.  She has no early family history of CAD. She used to smoke in the distant past. She does have rheumatoid arthritis. Previous carpal tunnel surgery, cyst removal.  She exercises.   Wt Readings from Last 3 Encounters:  12/20/13 167 lb (75.751 kg)  11/29/13 166 lb (75.297 kg)  11/29/13 166 lb (75.297 kg)     Past Medical History  Diagnosis Date  . Endometriosis   . Fibroid   . DUB (dysfunctional uterine bleeding)   . Rheumatoid arthritis(714.0)   . Hypercholesterolemia   . Hypertension     Past Surgical History  Procedure Laterality Date  . Abdominal hysterectomy  1991    TAH/BSO  . Hemorrhoid surgery    . Hand surgery    . Foot surgery      2 TIMES  . Breast surgery  1988    BREAST CYST  . Bartholin gland cyst excision  1980    RIGHT  . Oophorectomy      BSO  . Colonoscopy    . Colonoscopy N/A 08/24/2013    Procedure: COLONOSCOPY;  Surgeon: Rogene Houston, MD;  [facility];  Service: Endoscopy;  Laterality: N/A;  100  . Olecranon bursectomy Right 11/29/2013    Procedure: EXCISION OLECRANON BURSA RIGHT ;  Surgeon: Wynonia Sours, MD;  Location: [place];  Service: Orthopedics;  Laterality: Right;  . Tenosynovectomy Right 11/29/2013    Procedure: TENOSYNOVECTOMY FLEXOR TENDON RIGHT WRIST ;  Surgeon: Wynonia Sours, MD;  Location: [place];  Service: Orthopedics;  Laterality: Right;    Current  Outpatient Prescriptions  Medication Sig Dispense Refill  . ALPRAZolam (XANAX) 0.25 MG tablet Take 0.25 mg by mouth at bedtime as needed for sleep.      Marland Kitchen aspirin 81 MG tablet Take 81 mg by mouth daily.      . cycloSPORINE (RESTASIS) 0.05 % ophthalmic emulsion Place 1 drop into both eyes daily.       . folic acid (FOLVITE) 1 MG tablet Take 1 mg by mouth daily.      . hydroxychloroquine (PLAQUENIL) 200 MG tablet Take 400 mg by mouth daily.       Marland Kitchen losartan-hydrochlorothiazide (HYZAAR) 100-12.5 MG per tablet Take 1 tablet by mouth daily.      . methotrexate (RHEUMATREX) 2.5 MG tablet Take a total of 5 tab a week      . NONFORMULARY OR COMPOUNDED ITEM Estradiol .02% 1 ML Prefilled Applicator Sig: apply vaginally twice a week #90 Day Supply with 4 refills  1 each  4  . nystatin-triamcinolone (MYCOLOG II) cream Apply 1 application topically 2 (two) times daily. Applied twice daily as directed for two weeks and then on an as-needed basis  30 g  1  . VITAMIN D, CHOLECALCIFEROL, PO Take by mouth once.       No current facility-administered medications for this visit.    Allergies:    Allergies  Allergen Reactions  . Other Nausea And Vomiting    Doesn't do well with pain  Medications, sensitivity to tape    Social History:  The patient  reports that she quit smoking about 9 years ago. Her smoking use included Cigarettes. She has a 24 pack-year smoking history. She has never used smokeless tobacco. She reports that she does not drink alcohol or use illicit drugs.   Family History  Problem Relation Age of Onset  . Cancer Mother     LUNG  . Hypertension Sister   . Cancer Sister     LUNG, THYROID, UTERINE  . Cancer Brother     LUNG  . Cancer Sister     Lung cancer  . Colon cancer Neg Hx     ROS:  Please see the history of present illness.   Denies any fevers, chills, orthopnea, PND, syncope, chest pain, shortness of breath, bleeding   All other systems reviewed and negative.   PHYSICAL  EXAM: VS:  BP 134/77  Pulse 62  Ht 5\' 7"  (1.702 m)  Wt 167 lb (75.751 kg)  BMI 26.15 kg/m2 Well nourished, well developed, in no acute distress HEENT: normal, [place]/AT, EOMI Neck: no JVD, normal carotid upstroke, no bruit Cardiac:  normal S1, S2; RRR; no murmur Lungs:  clear to auscultation bilaterally, no wheezing, rhonchi or rales Abd: soft, nontender, no hepatomegaly, no bruits Ext: no edema, 2+ distal pulses Skin: warm and dry GU: deferred Neuro: no focal abnormalities noted, AAO x 3  EKG:  11/28/13-sinus rhythm, PAC, right bundle branch block, left anterior fascicular block, bifascicular block  Labs: Creatinine 1.07, potassium 4.3, LDL 99, HDL 46, TSH 2.5, CK 33   Prior medical records reviewed  ASSESSMENT AND PLAN:  1. Right bundle branch block, left anterior fascicular block, bifascicular block-she is at increased risk for worsening conduction disease, pacemaker in the future. She has no symptoms of syncope, dizziness, chest pain, shortness of breath. Avoid beta blockers, calcium channel blockers or other AV nodal blocking agents. Previously, she had had workup by Renato Shin, M.D. which included stress test. This was reassuring. No further testing warranted. Right bundle branch block does not portend an increase in mortality. 2. Hypertension-currently well controlled. Medications reviewed. 3. I will see her back in one year, check EKG at that time. She knows to contact me if any worrisome symptoms develop.  Signed, Candee Furbish, MD [facility]  12/20/2013 3:51 PM

## 2013-12-20 NOTE — Patient Instructions (Signed)
Your physician recommends that you continue on your current medications as directed. Please refer to the Current Medication list given to you today.  Your physician wants you to follow-up in: 1 year. You will receive a reminder letter in the mail two months in advance. If you don't receive a letter, please call our office to schedule the follow-up appointment.  

## 2014-02-15 ENCOUNTER — Other Ambulatory Visit (HOSPITAL_COMMUNITY): Payer: Self-pay

## 2014-02-15 ENCOUNTER — Other Ambulatory Visit (HOSPITAL_COMMUNITY): Payer: Self-pay | Admitting: Internal Medicine

## 2014-02-15 ENCOUNTER — Other Ambulatory Visit: Payer: Self-pay

## 2014-02-15 DIAGNOSIS — Z1231 Encounter for screening mammogram for malignant neoplasm of breast: Secondary | ICD-10-CM

## 2014-03-14 ENCOUNTER — Ambulatory Visit: Payer: Medicare Other

## 2014-03-14 ENCOUNTER — Ambulatory Visit (HOSPITAL_COMMUNITY)
Admission: RE | Admit: 2014-03-14 | Discharge: 2014-03-14 | Disposition: A | Discharge status: Home / Self Care | Payer: Medicare Other | Source: Ambulatory Visit | Attending: Internal Medicine | Admitting: Internal Medicine

## 2014-03-14 DIAGNOSIS — Z1231 Encounter for screening mammogram for malignant neoplasm of breast: Secondary | ICD-10-CM

## 2014-05-28 ENCOUNTER — Encounter: Payer: Self-pay | Admitting: Cardiology

## 2014-06-15 ENCOUNTER — Encounter: Payer: Self-pay | Admitting: Gynecology

## 2014-06-15 ENCOUNTER — Ambulatory Visit (INDEPENDENT_AMBULATORY_CARE_PROVIDER_SITE_OTHER): Payer: Medicare Other | Admitting: Gynecology

## 2014-06-15 VITALS — BP 120/76 | Ht 66.5 in | Wt 167.0 lb

## 2014-06-15 DIAGNOSIS — Z79890 Hormone replacement therapy (postmenopausal): Secondary | ICD-10-CM

## 2014-06-15 DIAGNOSIS — M8580 Other specified disorders of bone density and structure, unspecified site: Secondary | ICD-10-CM

## 2014-06-15 DIAGNOSIS — N952 Postmenopausal atrophic vaginitis: Secondary | ICD-10-CM | POA: Insufficient documentation

## 2014-06-15 DIAGNOSIS — L304 Erythema intertrigo: Secondary | ICD-10-CM

## 2014-06-15 NOTE — Patient Instructions (Addendum)
Tdap Vaccine (Tetanus, Diphtheria, Pertussis): What You Need to Know 1. Why get vaccinated? Tetanus, diphtheria and pertussis can be very serious diseases, even for adolescents and adults. Tdap vaccine can protect us from these diseases. TETANUS (Lockjaw) causes painful muscle tightening and stiffness, usually all over the body.  It can lead to tightening of muscles in the head and neck so you can't open your mouth, swallow, or sometimes even breathe. Tetanus kills about 1 out of 5 people who are infected. DIPHTHERIA can cause a thick coating to form in the back of the throat.  It can lead to breathing problems, paralysis, heart failure, and death. PERTUSSIS (Whooping Cough) causes severe coughing spells, which can cause difficulty breathing, vomiting and disturbed sleep.  It can also lead to weight loss, incontinence, and rib fractures. Up to 2 in 100 adolescents and 5 in 100 adults with pertussis are hospitalized or have complications, which could include pneumonia or death. These diseases are caused by bacteria. Diphtheria and pertussis are spread from person to person through coughing or sneezing. Tetanus enters the body through cuts, scratches, or wounds. Before vaccines, the [place] saw as many as 200,000 cases a year of diphtheria and pertussis, and hundreds of cases of tetanus. Since vaccination began, tetanus and diphtheria have dropped by about 99% and pertussis by about 80%. 2. Tdap vaccine Tdap vaccine can protect adolescents and adults from tetanus, diphtheria, and pertussis. One dose of Tdap is routinely given at age 11 or 12. People who did not get Tdap at that age should get it as soon as possible. Tdap is especially important for health care professionals and anyone having close contact with a baby younger than 12 months. Pregnant women should get a dose of Tdap during every pregnancy, to protect the newborn from pertussis. Infants are most at risk for severe, life-threatening  complications from pertussis. A similar vaccine, called Td, protects from tetanus and diphtheria, but not pertussis. A Td booster should be given every 10 years. Tdap may be given as one of these boosters if you have not already gotten a dose. Tdap may also be given after a severe cut or burn to prevent tetanus infection. Your doctor can give you more information. Tdap may safely be given at the same time as other vaccines. 3. Some people should not get this vaccine  If you ever had a life-threatening allergic reaction after a dose of any tetanus, diphtheria, or pertussis containing vaccine, OR if you have a severe allergy to any part of this vaccine, you should not get Tdap. Tell your doctor if you have any severe allergies.  If you had a coma, or long or multiple seizures within 7 days after a childhood dose of DTP or DTaP, you should not get Tdap, unless a cause other than the vaccine was found. You can still get Td.  Talk to your doctor if you:  have epilepsy or another nervous system problem,  had severe pain or swelling after any vaccine containing diphtheria, tetanus or pertussis,  ever had Guillain-Barr Syndrome (GBS),  aren't feeling well on the day the shot is scheduled. 4. Risks of a vaccine reaction With any medicine, including vaccines, there is a chance of side effects. These are usually mild and go away on their own, but serious reactions are also possible. Brief fainting spells can follow a vaccination, leading to injuries from falling. Sitting or lying down for about 15 minutes can help prevent these. Tell your doctor if you feel   dizzy or light-headed, or have vision changes or ringing in the ears. Mild problems following Tdap (Did not interfere with activities)  Pain where the shot was given (about 3 in 4 adolescents or 2 in 3 adults)  Redness or swelling where the shot was given (about 1 person in 5)  Mild fever of at least 100.42F (up to about 1 in 25 adolescents or  1 in 100 adults)  Headache (about 3 or 4 people in 10)  Tiredness (about 1 person in 3 or 4)  Nausea, vomiting, diarrhea, stomach ache (up to 1 in 4 adolescents or 1 in 10 adults)  Chills, body aches, sore joints, rash, swollen glands (uncommon) Moderate problems following Tdap (Interfered with activities, but did not require medical attention)  Pain where the shot was given (about 1 in 5 adolescents or 1 in 100 adults)  Redness or swelling where the shot was given (up to about 1 in 16 adolescents or 1 in 25 adults)  Fever over 102F (about 1 in 100 adolescents or 1 in 250 adults)  Headache (about 3 in 20 adolescents or 1 in 10 adults)  Nausea, vomiting, diarrhea, stomach ache (up to 1 or 3 people in 100)  Swelling of the entire arm where the shot was given (up to about 3 in 100). Severe problems following Tdap (Unable to perform usual activities; required medical attention)  Swelling, severe pain, bleeding and redness in the arm where the shot was given (rare). A severe allergic reaction could occur after any vaccine (estimated less than 1 in a million doses). 5. What if there is a serious reaction? What should I look for?  Look for anything that concerns you, such as signs of a severe allergic reaction, very high fever, or behavior changes. Signs of a severe allergic reaction can include hives, swelling of the face and throat, difficulty breathing, a fast heartbeat, dizziness, and weakness. These would start a few minutes to a few hours after the vaccination. What should I do?  If you think it is a severe allergic reaction or other emergency that can't wait, call 9-1-1 or get the person to the nearest hospital. Otherwise, call your doctor.  Afterward, the reaction should be reported to the "Vaccine Adverse Event Reporting System" (VAERS). Your doctor might file this report, or you can do it yourself through the VAERS web site at www.vaers.SamedayNews.es, or by calling  (520)690-0718. VAERS is only for reporting reactions. They do not give medical advice.  6. The National Vaccine Injury Compensation Program The [employer] Vaccine Injury Compensation Program (VICP) is a federal program that was created to compensate people who may have been injured by certain vaccines. Persons who believe they may have been injured by a vaccine can learn about the program and about filing a claim by calling 825-685-9853 or visiting the [place] website at GoldCloset.com.ee. 7. How can I learn more?  Ask your doctor.  Call your local or state health department.  Contact the Centers for Disease Control and Prevention (CDC):  Call 860-456-8554 or visit CDC's website at http://hunter.com/. CDC Tdap Vaccine VIS (12/03/11) Document Released: 01/12/2012 Document Revised: 11/27/2013 Document Reviewed: 10/25/2013 ExitCare Patient Information 2015 [place], [place]. This information is not intended to replace advice given to you by your health care provider. Make sure you discuss any questions you have with your health care provider. Intertrigo Intertrigo is a skin condition that occurs in between folds of skin in places on the body that rub together a lot and do  not get much ventilation. It is caused by heat, moisture, friction, sweat retention, and lack of air circulation, which produces red, irritated patches and, sometimes, scaling or drainage. People who have diabetes, who are obese, or who have treatment with antibiotics are at increased risk for intertrigo. The most common sites for intertrigo to occur include:  The groin.  The breasts.  The armpits.  Folds of abdominal skin.  Webbed spaces between the fingers or toes. Intertrigo may be aggravated by:  Sweat.  Feces.  Yeast or bacteria that are present near skin folds.  Urine.  Vaginal discharge. HOME CARE INSTRUCTIONS  The following steps can be taken to reduce friction and keep the affected area  cool and dry:  Expose skin folds to the air.  Keep deep skin folds separated with cotton or linen cloth. Avoid tight fitting clothing that could cause chafing.  Wear open-toed shoes or sandals to help reduce moisture between the toes.  Apply absorbent powders to affected areas as directed by your caregiver.  Apply over-the-counter barrier pastes, such as zinc oxide, as directed by your caregiver.  If you develop a fungal infection in the affected area, your caregiver may have you use antifungal creams. SEEK MEDICAL CARE IF:   The rash is not improving after 1 week of treatment.  The rash is getting worse (more red, more swollen, more painful, or spreading).  You have a fever or chills. MAKE SURE YOU:   Understand these instructions.  Will watch your condition.  Will get help right away if you are not doing well or get worse. Document Released: 07/13/2005 Document Revised: 10/05/2011 Document Reviewed: 12/26/2009 [facility] Patient Information 2015 [place], [place]. This information is not intended to replace advice given to you by your health care provider. Make sure you discuss any questions you have with your health care provider.

## 2014-06-15 NOTE — Progress Notes (Signed)
Bethany Grant [birth year] [id number]   History:    73 y.o. for GYN follow-up exam. Patient was complaining of irritation underneath her bra and right groin area. She had some Mycolog cream which she has started to use for the past 2 days. Patient also complaining of vaginal dryness and irritation.She does have an epidermoid cyst on her left labia that we've been watching. She does not feel it has changed. It is not bother her enough for removal. She is status post TAH, BSO done for fibroids and endometriosis. Many years ago she had: koilocytic atypia on Pap but has had normal Paps for greater than 20 years. She had a normal Pap last year. She has no vaginal bleeding. She has no pelvic pain. She sees a pulmonologist regularly because of her strong family history of lung cancer and her severe history of rheumatoid arthritis. Patient's last bone density study in 2014 indicated that the lowest T score was -2.0 at the [facility] with statistically significant decrease in bone mineralization of -15.5%. Patient had a normal calcium vitamin D and PTH level and has decided to wait before any treatment until next year bone density study. She does take calcium and vitamin D on a regular basis. Her recent colonoscopy this year was normal. Patient with no past history of abnormal Pap smear. Her mammogram was normal this year. Patient's flu vaccine is up-to-date.  Past medical history,surgical history, family history and social history were all reviewed and documented in the EPIC chart.  Gynecologic History No LMP recorded. Patient has had a hysterectomy. Contraception: post menopausal status Last Pap: 2012normal. Results were: normal Last mammogram: 2015. Results were: normal  Obstetric History OB History  Gravida Para Term Preterm AB SAB TAB Ectopic Multiple Living  3 3 3       3     # Outcome Date GA Lbr Len/2nd Weight Sex Delivery Anes PTL Lv  3 Term           2 Term           1 Term                 ROS: A ROS was performed and pertinent positives and negatives are included in the history.  GENERAL: No fevers or chills. HEENT: No change in vision, no earache, sore throat or sinus congestion. NECK: No pain or stiffness. CARDIOVASCULAR: No chest pain or pressure. No palpitations. PULMONARY: No shortness of breath, cough or wheeze. GASTROINTESTINAL: No abdominal pain, nausea, vomiting or diarrhea, melena or bright red blood per rectum. GENITOURINARY: No urinary frequency, urgency, hesitancy or dysuria. MUSCULOSKELETAL: No joint or muscle pain, no back pain, no recent trauma. DERMATOLOGIC: No rash, no itching, no lesions. ENDOCRINE: No polyuria, polydipsia, no heat or cold intolerance. No recent change in weight. HEMATOLOGICAL: No anemia or easy bruising or bleeding. NEUROLOGIC: No headache, seizures, numbness, tingling or weakness. PSYCHIATRIC: No depression, no loss of interest in normal activity or change in sleep pattern.     Exam: chaperone present  BP 120/76 mmHg  Ht 5' 6.5" (1.689 m)  Wt 167 lb (75.751 kg)  BMI 26.55 kg/m2  Body mass index is 26.55 kg/(m^2).  General appearance : Well developed well nourished female. No acute distress HEENT: Neck supple, trachea midline, no carotid bruits, no thyroidmegaly Lungs: Clear to auscultation, no rhonchi or wheezes, or rib retractions  Heart: Regular rate and rhythm, no murmurs or gallops Breast:Examined in sitting and supine position were symmetrical in appearance,  no palpable masses or tenderness,  no skin retraction, no nipple inversion, no nipple discharge, no skin discoloration, no axillary or supraclavicular lymphadenopathy Abdomen: no palpable masses or tenderness, no rebound or guarding Extremities: no edema or skin discoloration or tenderness  Right groin intertrigo  Pelvic:  Bartholin, Urethra, Skene Glands: Within normal limits             Vagina: No gross lesions or discharge, vaginal atrophy  Cervix: Vaginal cuff  intact  Uterus  vaginal cuff intact  Adnexa  Without masses or tenderness  Anus and perineum  normal   Rectovaginal  normal sphincter tone without palpated masses or tenderness             Hemoccult PCP provides     Assessment/Plan:  73 y.o. female with clinical evidence of right groin intertrigo. Patient was instructed to continue to use the Mycolog cream twice a day for 10-14 days. If no resolution she'll contact her dermatologist. Patient will be due for her bone density study next year. We discussed importance of calcium vitamin D for osteoporosis prevention. Patient uses vaginal estrogen once or twice a week when necessary. Flu vaccine up-to-date.   Terrance Mass MD, 10:07 AM 06/15/2014

## 2014-12-18 ENCOUNTER — Other Ambulatory Visit (INDEPENDENT_AMBULATORY_CARE_PROVIDER_SITE_OTHER): Payer: Self-pay | Admitting: Internal Medicine

## 2014-12-20 ENCOUNTER — Encounter: Payer: Self-pay | Admitting: Cardiology

## 2014-12-20 ENCOUNTER — Ambulatory Visit (INDEPENDENT_AMBULATORY_CARE_PROVIDER_SITE_OTHER): Payer: Medicare Other | Admitting: Cardiology

## 2014-12-20 VITALS — BP 128/70 | HR 60 | Ht 67.0 in | Wt 164.0 lb

## 2014-12-20 DIAGNOSIS — I4510 Unspecified right bundle-branch block: Secondary | ICD-10-CM | POA: Diagnosis not present

## 2014-12-20 DIAGNOSIS — R9431 Abnormal electrocardiogram [ECG] [EKG]: Secondary | ICD-10-CM | POA: Diagnosis not present

## 2014-12-20 DIAGNOSIS — M069 Rheumatoid arthritis, unspecified: Secondary | ICD-10-CM

## 2014-12-20 DIAGNOSIS — I444 Left anterior fascicular block: Secondary | ICD-10-CM

## 2014-12-20 DIAGNOSIS — R5383 Other fatigue: Secondary | ICD-10-CM | POA: Diagnosis not present

## 2014-12-20 DIAGNOSIS — I10 Essential (primary) hypertension: Secondary | ICD-10-CM

## 2014-12-20 NOTE — Patient Instructions (Signed)
Medication Instructions:  Your physician recommends that you continue on your current medications as directed. Please refer to the Current Medication list given to you today.  Testing/Procedures: Your physician has requested that you have a lexiscan myoview. For further information please visit HugeFiesta.[email or web address]. Please follow instruction sheet, as given.  Follow-Up: Follow up in 1 year with Dr. Marlou Porch.  You will receive a letter in the mail 2 months before you are due.  Please call us when you receive this letter to schedule your follow up appointment.  Thank you for choosing [facility]!!

## 2014-12-20 NOTE — Progress Notes (Signed)
[facility]. [address], [address], [place]  [address] Phone: [phone] Fax:  [phone]  Date:  12/20/2014   ID:  Bethany Grant, DOB 12-16-1940, MRN [id number]  PCP:  Delphina Cahill, MD   History of Present Illness: Bethany Grant is a 74 y.o. female here for consultation to evaluate abnormal EKG during preoperative visits at [facility]. Currently with no symptoms. Has a history of hyperlipidemia, hypertension and rheumatoid arthritis.  EKG demonstrated RBBB, LAFB, bifascicular block. PACs noted. Asymptomatic.  She has no early family history of CAD. She used to smoke in the distant past. She does have rheumatoid arthritis. Previous carpal tunnel surgery, cyst removal.  She exercises.  Her husband had stent placement after cardiac catheterization. He was having similar symptoms of fatigue. She is concerned because she feels tired. Could be anemia, could be methotrexate could be rheumatoid arthritis.   Wt Readings from Last 3 Encounters:  12/20/14 164 lb (74.39 kg)  06/15/14 167 lb (75.751 kg)  12/20/13 167 lb (75.751 kg)     Past Medical History  Diagnosis Date  . Endometriosis   . Fibroid   . DUB (dysfunctional uterine bleeding)   . Rheumatoid arthritis(714.0)   . Hypercholesterolemia   . Hypertension     Past Surgical History  Procedure Laterality Date  . Abdominal hysterectomy  1991    TAH/BSO  . Hemorrhoid surgery    . Hand surgery    . Foot surgery      2 TIMES  . Breast surgery  1988    BREAST CYST  . Bartholin gland cyst excision  1980    RIGHT  . Oophorectomy      BSO  . Colonoscopy    . Colonoscopy N/A 08/24/2013    Procedure: COLONOSCOPY;  Surgeon: Rogene Houston, MD;  Location: [facility];  Service: Endoscopy;  Laterality: N/A;  100  . Olecranon bursectomy Right 11/29/2013    Procedure: EXCISION OLECRANON BURSA RIGHT ;  Surgeon: Wynonia Sours, MD;  Location: [facility];  Service: Orthopedics;  Laterality:  Right;  . Tenosynovectomy Right 11/29/2013    Procedure: TENOSYNOVECTOMY FLEXOR TENDON RIGHT WRIST ;  Surgeon: Wynonia Sours, MD;  Location: [facility];  Service: Orthopedics;  Laterality: Right;  . Foot surgery Left     Current Outpatient Prescriptions  Medication Sig Dispense Refill  . ALPRAZolam (XANAX) 0.25 MG tablet Take 0.25 mg by mouth at bedtime as needed for sleep.    Marland Kitchen aspirin 81 MG tablet Take 81 mg by mouth daily.    . cycloSPORINE (RESTASIS) 0.05 % ophthalmic emulsion Place 1 drop into both eyes 2 (two) times daily.     . folic acid (FOLVITE) 1 MG tablet Take 1 mg by mouth daily.    . hydroxychloroquine (PLAQUENIL) 200 MG tablet Take 400 mg by mouth daily.     Marland Kitchen losartan-hydrochlorothiazide (HYZAAR) 100-12.5 MG per tablet Take 1 tablet by mouth daily.    . methotrexate (RHEUMATREX) 2.5 MG tablet Take 3 tablet on Fri and 2 tablets on Sat    . NONFORMULARY OR COMPOUNDED ITEM Estradiol .02% 1 ML Prefilled Applicator Sig: apply vaginally twice a week #90 Day Supply with 4 refills 1 each 4  . nystatin-triamcinolone (MYCOLOG II) cream APPLY TOPICALLY TWICE DAILY AS DIRECTED FOR TWO WEEKS AND THEN USE ON AN AS NEEDED BASIS 30 g 0  . VITAMIN D, CHOLECALCIFEROL, PO Take 2,000 Int'l Units  by mouth daily.      No current facility-administered medications for this visit.    Allergies:    Allergies  Allergen Reactions  . Other Nausea And Vomiting    Doesn't do well with pain  Medications, sensitivity to tape    Social History:  The patient  reports that she quit smoking about 10 years ago. Her smoking use included Cigarettes. She has a 24 pack-year smoking history. She has never used smokeless tobacco. She reports that she does not drink alcohol or use illicit drugs.   Family History  Problem Relation Age of Onset  . Cancer Mother     LUNG  . Hypertension Sister   . Cancer Sister     LUNG, THYROID, UTERINE  . Cancer Brother     LUNG  . Cancer Sister     Lung  cancer  . Colon cancer Neg Hx     ROS:  Please see the history of present illness.   Denies any fevers, chills, orthopnea, PND, syncope, chest pain, shortness of breath, bleeding   All other systems reviewed and negative.   PHYSICAL EXAM: VS:  BP 128/70 mmHg  Pulse 60  Ht 5\' 7"  (1.702 m)  Wt 164 lb (74.39 kg)  BMI 25.68 kg/m2  SpO2 98% Well nourished, well developed, in no acute distress HEENT: normal, [place]/AT, EOMI Neck: no JVD, normal carotid upstroke, no bruit Cardiac:  normal S1, S2; RRR; no murmur Lungs:  clear to auscultation bilaterally, no wheezing, rhonchi or rales Abd: soft, nontender, no hepatomegaly, no bruits Ext: no edema, 2+ distal pulses Skin: warm and dry GU: deferred Neuro: no focal abnormalities noted, AAO x 3  EKG:  Today, 12/20/14-sinus bradycardia rate 57) a branch block left anterior fascicular block bifascicular block-previous EKG 11/28/13-sinus rhythm, PAC, right bundle branch block, left anterior fascicular block, bifascicular block  Labs: Creatinine 1.07, potassium 4.3, LDL 99, HDL 46, TSH 2.5, CK 33   Prior medical records reviewed  ASSESSMENT AND PLAN:  1. Right bundle branch block, left anterior fascicular block, bifascicular block-she is at increased risk for worsening conduction disease, pacemaker in the future. Avoid beta blockers, calcium channel blockers or other AV nodal blocking agents. Previously, she had had workup by Renato Shin, M.D. which included stress test. This was reassuring. She has been having increasing fatigue which could be multifactorial from rheumatoid arthritis, methotrexate, anemia. She is concerned because her husband recently had stent placement and had similar symptoms. I will go ahead and check a pharmacologic stress test. Rheumatoid arthritis is a risk factor as well. 2. Hypertension-currently well controlled. Medications reviewed. 3. I will see her back in one year, check EKG at that time. She knows to contact me if any  worrisome symptoms develop.  Signed, Candee Furbish, MD [facility]  12/20/2014 11:23 AM

## 2014-12-26 ENCOUNTER — Encounter: Payer: Self-pay | Admitting: Gynecology

## 2014-12-26 ENCOUNTER — Ambulatory Visit (INDEPENDENT_AMBULATORY_CARE_PROVIDER_SITE_OTHER): Payer: Medicare Other | Admitting: Gynecology

## 2014-12-26 VITALS — BP 120/70

## 2014-12-26 DIAGNOSIS — L292 Pruritus vulvae: Secondary | ICD-10-CM

## 2014-12-26 DIAGNOSIS — N952 Postmenopausal atrophic vaginitis: Secondary | ICD-10-CM | POA: Diagnosis not present

## 2014-12-26 DIAGNOSIS — B373 Candidiasis of vulva and vagina: Secondary | ICD-10-CM | POA: Diagnosis not present

## 2014-12-26 DIAGNOSIS — B3731 Acute candidiasis of vulva and vagina: Secondary | ICD-10-CM

## 2014-12-26 LAB — WET PREP FOR TRICH, YEAST, CLUE
Clue Cells Wet Prep HPF POC: NONE SEEN
Trich, Wet Prep: NONE SEEN

## 2014-12-26 MED ORDER — CLOBETASOL PROPIONATE 0.05 % EX CREA: TOPICAL_CREAM | CUTANEOUS | Status: DC

## 2014-12-26 MED ORDER — FLUCONAZOLE 150 MG PO TABS: 150.0000 mg | ORAL_TABLET | Freq: Once | ORAL | Status: DC

## 2014-12-26 NOTE — Patient Instructions (Addendum)
Clobetasol Propionate skin cream What is this medicine? CLOBETASOL (kloe BAY ta sol) is a corticosteroid. It is used on the skin to treat itching, redness, and swelling caused by some skin conditions. This medicine may be used for other purposes; ask your health care provider or pharmacist if you have questions. COMMON BRAND NAME(S): Cormax, Embeline, Embeline E, Temovate, Temovate E What should I tell my health care provider before I take this medicine? They need to know if you have any of these conditions: -any type of active infection including measles, tuberculosis, herpes, or chickenpox -circulation problems or vascular disease -large areas of burned or damaged skin -rosacea -skin wasting or thinning -an unusual or allergic reaction to clobetasol, corticosteroids, other medicines, foods, dyes, or preservatives -pregnant or trying to get pregnant -breast-feeding How should I use this medicine? This medicine is for external use only. Do not take by mouth. Follow the directions on the prescription label. Wash your hands before and after use. Apply a thin film of medicine to the affected area. Do not cover with a bandage or dressing unless your doctor or health care professional tells you to. Do not get this medicine in your eyes. If you do, rinse out with plenty of cool tap water. It is important not to use more medicine than prescribed. Do not use your medicine more often than directed. To do so may increase the chance of side effects. Talk to your pediatrician regarding the use of this medicine in children. Special care may be needed. Elderly patients are more likely to have damaged skin through aging, and this may increase side effects. This medicine should only be used for brief periods and infrequently in older patients. Overdosage: If you think you have taken too much of this medicine contact a poison control center or emergency room at once. NOTE: This medicine is only for you. Do not  share this medicine with others. What if I miss a dose? If you miss a dose, use it as soon as you can. If it is almost time for your next dose, use only that dose. Do not use double or extra doses. What may interact with this medicine? Interactions are not expected. Do not use cosmetics or other skin care products on the treated area. This list may not describe all possible interactions. Give your health care provider a list of all the medicines, herbs, non-prescription drugs, or dietary supplements you use. Also tell them if you smoke, drink alcohol, or use illegal drugs. Some items may interact with your medicine. What should I watch for while using this medicine? Tell your doctor or health care professional if your symptoms do not get better within 2 weeks, or if you develop skin irritation from the medicine. Tell your doctor or health care professional if you are exposed to anyone with measles or chickenpox, or if you develop sores or blisters that do not heal properly. What side effects may I notice from receiving this medicine? Side effects that you should report to your doctor or health care professional as soon as possible: -allergic reactions like skin rash, itching or hives, swelling of the face, lips, or tongue -changes in vision -lack of healing of the skin condition -painful, red, pus filled blisters on the skin or in hair follicles -thinning of the skin with easy bruising Side effects that usually do not require medical attention (report to your doctor or health care professional if they continue or are bothersome): -burning, irritation of the skin -redness or  scaling of the skin This list may not describe all possible side effects. Call your doctor for medical advice about side effects. You may report side effects to FDA at 1-800-FDA-1088. Where should I keep my medicine? Keep out of the reach of children. Store at room temperature between 15 and 30 degrees C (59 and 86 degrees  F). Keep away from heat and direct light. Do not freeze. Throw away any unused medicine after the expiration date. NOTE: This sheet is a summary. It may not cover all possible information. If you have questions about this medicine, talk to your doctor, pharmacist, or health care provider.  2015, Elsevier/Gold Standard. (2007-10-19 16:56:45) Fluconazole tablets What is this medicine? FLUCONAZOLE (floo KON na zole) is an antifungal medicine. It is used to treat certain kinds of fungal or yeast infections. This medicine may be used for other purposes; ask your health care provider or pharmacist if you have questions. COMMON BRAND NAME(S): Diflucan What should I tell my health care provider before I take this medicine? They need to know if you have any of these conditions: -electrolyte abnormalities -history of irregular heart beat -kidney disease -an unusual or allergic reaction to fluconazole, other azole antifungals, medicines, foods, dyes, or preservatives -pregnant or trying to get pregnant -breast-feeding How should I use this medicine? Take this medicine by mouth. Follow the directions on the prescription label. Do not take your medicine more often than directed. Talk to your pediatrician regarding the use of this medicine in children. Special care may be needed. This medicine has been used in children as young as 51 months of age. Overdosage: If you think you have taken too much of this medicine contact a poison control center or emergency room at once. NOTE: This medicine is only for you. Do not share this medicine with others. What if I miss a dose? If you miss a dose, take it as soon as you can. If it is almost time for your next dose, take only that dose. Do not take double or extra doses. What may interact with this medicine? Do not take this medicine with any of the following medications: -astemizole -certain medicines for irregular heart beat like dofetilide, dronedarone,  quinidine -cisapride -erythromycin -lomitapide -other medicines that prolong the QT interval (cause an abnormal heart rhythm) -pimozide -terfenadine -thioridazine -tolvaptan -ziprasidone This medicine may also interact with the following medications: -antiviral medicines for HIV or AIDS -birth control pills -certain antibiotics like rifabutin, rifampin -certain medicines for blood pressure like amlodipine, isradipine, felodipine, hydrochlorothiazide, losartan, nifedipine -certain medicines for cancer like cyclophosphamide, vinblastine, vincristine -certain medicines for cholesterol like atorvastatin, lovastatin, fluvastatin, simvastatin -certain medicines for depression, anxiety, or psychotic disturbances like amitriptyline, midazolam, nortriptyline, triazolam -certain medicines for diabetes like glipizide, glyburide, tolbutamide -certain medicines for pain like alfentanil, fentanyl, methadone -certain medicines for seizures like carbamazepine, phenytoin -certain medicines that treat or prevent blood clots like warfarin -halofantrine -medicines that lower your chance of fighting infection like cyclosporine, prednisone, tacrolimus -NSAIDS, medicines for pain and inflammation, like celecoxib, diclofenac, flurbiprofen, ibuprofen, meloxicam, naproxen -other medicines for fungal infections -sirolimus -theophylline -tofacitinib This list may not describe all possible interactions. Give your health care provider a list of all the medicines, herbs, non-prescription drugs, or dietary supplements you use. Also tell them if you smoke, drink alcohol, or use illegal drugs. Some items may interact with your medicine. What should I watch for while using this medicine? Visit your doctor or health care professional for regular checkups. If you are  taking this medicine for a long time you may need blood work. Tell your doctor if your symptoms do not improve. Some fungal infections need many weeks or  months of treatment to cure. Alcohol can increase possible damage to your liver. Avoid alcoholic drinks. If you have a vaginal infection, do not have sex until you have finished your treatment. You can wear a sanitary napkin. Do not use tampons. Wear freshly washed cotton, not synthetic, panties. What side effects may I notice from receiving this medicine? Side effects that you should report to your doctor or health care professional as soon as possible: -allergic reactions like skin rash or itching, hives, swelling of the lips, mouth, tongue, or throat -dark urine -feeling dizzy or faint -irregular heartbeat or chest pain -redness, blistering, peeling or loosening of the skin, including inside the mouth -trouble breathing -unusual bruising or bleeding -vomiting -yellowing of the eyes or skin Side effects that usually do not require medical attention (report to your doctor or health care professional if they continue or are bothersome): -changes in how food tastes -diarrhea -headache -stomach upset or nausea This list may not describe all possible side effects. Call your doctor for medical advice about side effects. You may report side effects to FDA at 1-800-FDA-1088. Where should I keep my medicine? Keep out of the reach of children. Store at room temperature below 30 degrees C (86 degrees F). Throw away any medicine after the expiration date. NOTE: This sheet is a summary. It may not cover all possible information. If you have questions about this medicine, talk to your doctor, pharmacist, or health care provider.  2015, Elsevier/Gold Standard. (2013-02-18 16:13:04) Monilial Vaginitis Vaginitis in a soreness, swelling and redness (inflammation) of the vagina and vulva. Monilial vaginitis is not a sexually transmitted infection. CAUSES  Yeast vaginitis is caused by yeast (candida) that is normally found in your vagina. With a yeast infection, the candida has overgrown in number to a  point that upsets the chemical balance. SYMPTOMS   White, thick vaginal discharge.  Swelling, itching, redness and irritation of the vagina and possibly the lips of the vagina (vulva).  Burning or painful urination.  Painful intercourse. DIAGNOSIS  Things that may contribute to monilial vaginitis are:  Postmenopausal and virginal states.  Pregnancy.  Infections.  Being tired, sick or stressed, especially if you had monilial vaginitis in the past.  Diabetes. Good control will help lower the chance.  Birth control pills.  Tight fitting garments.  Using bubble bath, feminine sprays, douches or deodorant tampons.  Taking certain medications that kill germs (antibiotics).  Sporadic recurrence can occur if you become ill. TREATMENT  Your caregiver will give you medication.  There are several kinds of anti monilial vaginal creams and suppositories specific for monilial vaginitis. For recurrent yeast infections, use a suppository or cream in the vagina 2 times a week, or as directed.  Anti-monilial or steroid cream for the itching or irritation of the vulva may also be used. Get your caregiver's permission.  Painting the vagina with methylene blue solution may help if the monilial cream does not work.  Eating yogurt may help prevent monilial vaginitis. HOME CARE INSTRUCTIONS   Finish all medication as prescribed.  Do not have sex until treatment is completed or after your caregiver tells you it is okay.  Take warm sitz baths.  Do not douche.  Do not use tampons, especially scented ones.  Wear cotton underwear.  Avoid tight pants and panty hose.  Tell  your sexual partner that you have a yeast infection. They should go to their caregiver if they have symptoms such as mild rash or itching.  Your sexual partner should be treated as well if your infection is difficult to eliminate.  Practice safer sex. Use condoms.  Some vaginal medications cause latex condoms to  fail. Vaginal medications that harm condoms are:  Cleocin cream.  Butoconazole (Femstat).  Terconazole (Terazol) vaginal suppository.  Miconazole (Monistat) (may be purchased over the counter). SEEK MEDICAL CARE IF:   You have a temperature by mouth above 102 F (38.9 C).  The infection is getting worse after 2 days of treatment.  The infection is not getting better after 3 days of treatment.  You develop blisters in or around your vagina.  You develop vaginal bleeding, and it is not your menstrual period.  You have pain when you urinate.  You develop intestinal problems.  You have pain with sexual intercourse. Document Released: 04/22/2005 Document Revised: 10/05/2011 Document Reviewed: 01/04/2009 [facility] Patient Information 2015 [place], [place]. This information is not intended to replace advice given to you by your health care provider. Make sure you discuss any questions you have with your health care provider.

## 2014-12-26 NOTE — Progress Notes (Signed)
    Patient is a 74 year old who presented to the office today with a complaint of today history of vulvar pruritus. Patient denies any vaginal discharge. Patient not sexually active. Patient on no hormone. Patient does have history of rheumatoid arthritis for which she is on methotrexate.  Exam: Blood pressure 120/70 Gen. appearance well-developed nourished female in no acute distress Pelvic exam: Bartholin urethra Skene glands within normal limits External genitalia excoriated areas of the labia majora and perineum and perirectal area slightly or erythematous no plaques or hyperpigmented area requiring any biopsy. Appears more from chronic scratching. Vaginal exam atrophy noted, vaginal cuff intact Rectal exam: Not done  Wet prep few yeast were noted  Assessment/plan: #1 monilial vaginal infection will be treated with Diflucan 150 mg 1 by mouth today. #2 for her dermatitis from external genitalia which could be attributed to her methotrexate she'll be placed on clobetasol cream 0.05% to apply twice a day for 10-14 days.

## 2015-01-07 ENCOUNTER — Telehealth (HOSPITAL_COMMUNITY): Payer: Self-pay | Admitting: *Deleted

## 2015-01-07 NOTE — Telephone Encounter (Signed)
Patient given detailed instructions per Myocardial Perfusion Study Information Sheet for test on 01/09/15 at 0900. Patient Notified to arrive 15 minutes early, and that it is imperative to arrive on time for appointment to keep from having the test rescheduled. Patient verbalized understanding. Bethany Grant, Ranae Palms

## 2015-01-09 ENCOUNTER — Ambulatory Visit (HOSPITAL_COMMUNITY): Payer: Medicare Other | Attending: Cardiology

## 2015-01-09 DIAGNOSIS — R5383 Other fatigue: Secondary | ICD-10-CM | POA: Diagnosis not present

## 2015-01-09 DIAGNOSIS — I10 Essential (primary) hypertension: Secondary | ICD-10-CM | POA: Diagnosis not present

## 2015-01-09 DIAGNOSIS — R9431 Abnormal electrocardiogram [ECG] [EKG]: Secondary | ICD-10-CM | POA: Diagnosis not present

## 2015-01-09 LAB — MYOCARDIAL PERFUSION IMAGING
CHL CUP NUCLEAR SRS: 3
CHL CUP NUCLEAR SSS: 5
LHR: 0.29
LV dias vol: 82 mL
LVSYSVOL: 22 mL
NUC STRESS TID: 1
Nuc Stress EF: 73 %
Peak HR: 82 {beats}/min
Rest HR: 60 {beats}/min
SDS: 2

## 2015-01-09 MED ORDER — REGADENOSON 0.4 MG/5ML IV SOLN
0.4000 mg | Freq: Once | INTRAVENOUS | Status: AC
  Administered 2015-01-09: 0.4 mg via INTRAVENOUS

## 2015-01-09 MED ORDER — TECHNETIUM TC 99M SESTAMIBI GENERIC - CARDIOLITE
33.0000 | Freq: Once | INTRAVENOUS | Status: AC | PRN
  Administered 2015-01-09: 33 via INTRAVENOUS

## 2015-01-09 MED ORDER — TECHNETIUM TC 99M SESTAMIBI GENERIC - CARDIOLITE
11.0000 | Freq: Once | INTRAVENOUS | Status: AC | PRN
  Administered 2015-01-09: 11 via INTRAVENOUS

## 2015-02-21 ENCOUNTER — Other Ambulatory Visit (HOSPITAL_COMMUNITY): Payer: Self-pay | Admitting: Internal Medicine

## 2015-02-21 DIAGNOSIS — Z1231 Encounter for screening mammogram for malignant neoplasm of breast: Secondary | ICD-10-CM

## 2015-03-25 ENCOUNTER — Ambulatory Visit (HOSPITAL_COMMUNITY): Payer: Medicare Other

## 2015-04-08 ENCOUNTER — Ambulatory Visit (HOSPITAL_COMMUNITY)
Admission: RE | Admit: 2015-04-08 | Discharge: 2015-04-08 | Disposition: A | Discharge status: Home / Self Care | Payer: Medicare Other | Source: Ambulatory Visit | Attending: Internal Medicine | Admitting: Internal Medicine

## 2015-04-08 DIAGNOSIS — Z1231 Encounter for screening mammogram for malignant neoplasm of breast: Secondary | ICD-10-CM | POA: Diagnosis present

## 2015-07-01 ENCOUNTER — Ambulatory Visit (INDEPENDENT_AMBULATORY_CARE_PROVIDER_SITE_OTHER): Payer: Medicare Other | Admitting: Gynecology

## 2015-07-01 ENCOUNTER — Encounter: Payer: Self-pay | Admitting: Gynecology

## 2015-07-01 VITALS — BP 120/70 | Ht 66.5 in | Wt 169.0 lb

## 2015-07-01 DIAGNOSIS — M8580 Other specified disorders of bone density and structure, unspecified site: Secondary | ICD-10-CM

## 2015-07-01 DIAGNOSIS — Z01419 Encounter for gynecological examination (general) (routine) without abnormal findings: Secondary | ICD-10-CM | POA: Diagnosis not present

## 2015-07-01 NOTE — Progress Notes (Signed)
Bethany Grant 1940-10-16 [id number]   History:    74 y.o.  for annual gyn exam who is having no complaints today.She is status post TAH, BSO done for fibroids and endometriosis. Many years ago she had: koilocytic atypia on Pap but has had normal Paps for greater than 20 years. She had a normal Pap last year. She has no vaginal bleeding. She has no pelvic pain. She sees a pulmonologist regularly because of her strong family history of lung cancer and her severe history of rheumatoid arthritis. Patient's last bone density study in 2014 indicated that the lowest T score was -2.0 at the [facility] with statistically significant decrease in bone mineralization of -15.5%. Patient had a normal calcium vitamin D and PTH level and has decided to wait before any treatment until bone density study this year.She does take calcium and vitamin D on a regular basis. Her recent colonoscopy in 2015 was normal. Patient's PCP has been doing her blood work and her vaccines are also up-to-date. Patient with no previous history of any abnormal Pap smears.  Past medical history,surgical history, family history and social history were all reviewed and documented in the EPIC chart.  Gynecologic History No LMP recorded. Patient has had a hysterectomy. Contraception: status post hysterectomy Last Pap: 2012. Results were: normal Last mammogram: 2016. Results were: normal  Obstetric History OB History  Gravida Para Term Preterm AB SAB TAB Ectopic Multiple Living  3 3 3       3     # Outcome Date GA Lbr Len/2nd Weight Sex Delivery Anes PTL Lv  3 Term           2 Term           1 Term                ROS: A ROS was performed and pertinent positives and negatives are included in the history.  GENERAL: No fevers or chills. HEENT: No change in vision, no earache, sore throat or sinus congestion. NECK: No pain or stiffness. CARDIOVASCULAR: No chest pain or pressure. No palpitations. PULMONARY: No shortness of breath,  cough or wheeze. GASTROINTESTINAL: No abdominal pain, nausea, vomiting or diarrhea, melena or bright red blood per rectum. GENITOURINARY: No urinary frequency, urgency, hesitancy or dysuria. MUSCULOSKELETAL: No joint or muscle pain, no back pain, no recent trauma. DERMATOLOGIC: No rash, no itching, no lesions. ENDOCRINE: No polyuria, polydipsia, no heat or cold intolerance. No recent change in weight. HEMATOLOGICAL: No anemia or easy bruising or bleeding. NEUROLOGIC: No headache, seizures, numbness, tingling or weakness. PSYCHIATRIC: No depression, no loss of interest in normal activity or change in sleep pattern.     Exam: chaperone present  BP 120/70 mmHg  Ht 5' 6.5" (1.689 m)  Wt 169 lb (76.658 kg)  BMI 26.87 kg/m2  Body mass index is 26.87 kg/(m^2).  General appearance : Well developed well nourished female. No acute distress HEENT: Eyes: no retinal hemorrhage or exudates,  Neck supple, trachea midline, no carotid bruits, no thyroidmegaly Lungs: Clear to auscultation, no rhonchi or wheezes, or rib retractions  Heart: Regular rate and rhythm, no murmurs or gallops Breast:Examined in sitting and supine position were symmetrical in appearance, no palpable masses or tenderness,  no skin retraction, no nipple inversion, no nipple discharge, no skin discoloration, no axillary or supraclavicular lymphadenopathy Abdomen: no palpable masses or tenderness, no rebound or guarding Extremities: no edema or skin discoloration or tenderness  Pelvic:  Bartholin, Urethra, Skene Glands:  Within normal limits             Vagina: No gross lesions or discharge, atrophic changes  Cervix: Absent  Uterus absent  Adnexa  Without masses or tenderness  Anus and perineum  normal   Rectovaginal  normal sphincter tone without palpated masses or tenderness             Hemoccult PCP provides     Assessment/Plan:  74 y.o. female for annual exam with history of osteopenia we'll schedule her bone density study  before the end of the year. We will also check her calcium, vitamin D, and PTH level. Her PCP has been doing her blood work and all her vaccines are up-to-date. We discussed importance of calcium vitamin D and weightbearing exercises for osteoporosis prevention. Pap smear not indicated any longer according to the new guidelines.   Terrance Mass MD, 10:53 AM 07/01/2015

## 2015-07-01 NOTE — Patient Instructions (Signed)

## 2015-07-02 LAB — VITAMIN D 25 HYDROXY (VIT D DEFICIENCY, FRACTURES): Vit D, 25-Hydroxy: 46 ng/mL (ref 30–100)

## 2015-07-02 LAB — PTH, INTACT AND CALCIUM
CALCIUM: 8.1 mg/dL — AB (ref 8.4–10.5)
PTH: 62 pg/mL (ref 14–64)

## 2015-07-18 ENCOUNTER — Other Ambulatory Visit: Payer: Self-pay | Admitting: Gynecology

## 2015-07-18 ENCOUNTER — Ambulatory Visit (INDEPENDENT_AMBULATORY_CARE_PROVIDER_SITE_OTHER): Payer: Medicare Other

## 2015-07-18 DIAGNOSIS — M8580 Other specified disorders of bone density and structure, unspecified site: Secondary | ICD-10-CM

## 2015-07-18 DIAGNOSIS — M8589 Other specified disorders of bone density and structure, multiple sites: Secondary | ICD-10-CM | POA: Diagnosis not present

## 2015-07-18 DIAGNOSIS — M899 Disorder of bone, unspecified: Secondary | ICD-10-CM

## 2015-08-26 DIAGNOSIS — H52203 Unspecified astigmatism, bilateral: Secondary | ICD-10-CM | POA: Diagnosis not present

## 2015-08-26 DIAGNOSIS — H524 Presbyopia: Secondary | ICD-10-CM | POA: Diagnosis not present

## 2015-08-26 DIAGNOSIS — H5203 Hypermetropia, bilateral: Secondary | ICD-10-CM | POA: Diagnosis not present

## 2015-08-28 DIAGNOSIS — R203 Hyperesthesia: Secondary | ICD-10-CM | POA: Diagnosis not present

## 2015-08-28 DIAGNOSIS — M059 Rheumatoid arthritis with rheumatoid factor, unspecified: Secondary | ICD-10-CM | POA: Diagnosis not present

## 2015-08-28 DIAGNOSIS — R5383 Other fatigue: Secondary | ICD-10-CM | POA: Diagnosis not present

## 2015-08-28 DIAGNOSIS — G4710 Hypersomnia, unspecified: Secondary | ICD-10-CM | POA: Diagnosis not present

## 2015-08-28 DIAGNOSIS — K5900 Constipation, unspecified: Secondary | ICD-10-CM | POA: Diagnosis not present

## 2015-09-25 DIAGNOSIS — L820 Inflamed seborrheic keratosis: Secondary | ICD-10-CM | POA: Diagnosis not present

## 2015-09-25 DIAGNOSIS — D1801 Hemangioma of skin and subcutaneous tissue: Secondary | ICD-10-CM | POA: Diagnosis not present

## 2015-09-25 DIAGNOSIS — L821 Other seborrheic keratosis: Secondary | ICD-10-CM | POA: Diagnosis not present

## 2015-09-25 DIAGNOSIS — L720 Epidermal cyst: Secondary | ICD-10-CM | POA: Diagnosis not present

## 2015-10-09 DIAGNOSIS — M79641 Pain in right hand: Secondary | ICD-10-CM | POA: Diagnosis not present

## 2015-10-09 DIAGNOSIS — M79642 Pain in left hand: Secondary | ICD-10-CM | POA: Diagnosis not present

## 2015-10-09 DIAGNOSIS — M25572 Pain in left ankle and joints of left foot: Secondary | ICD-10-CM | POA: Diagnosis not present

## 2015-10-09 DIAGNOSIS — M25571 Pain in right ankle and joints of right foot: Secondary | ICD-10-CM | POA: Diagnosis not present

## 2015-10-09 DIAGNOSIS — Z79899 Other long term (current) drug therapy: Secondary | ICD-10-CM | POA: Diagnosis not present

## 2015-10-09 DIAGNOSIS — D509 Iron deficiency anemia, unspecified: Secondary | ICD-10-CM | POA: Diagnosis not present

## 2015-10-09 DIAGNOSIS — M0570 Rheumatoid arthritis with rheumatoid factor of unspecified site without organ or systems involvement: Secondary | ICD-10-CM | POA: Diagnosis not present

## 2015-11-11 DIAGNOSIS — E039 Hypothyroidism, unspecified: Secondary | ICD-10-CM | POA: Diagnosis not present

## 2015-11-11 DIAGNOSIS — D509 Iron deficiency anemia, unspecified: Secondary | ICD-10-CM | POA: Diagnosis not present

## 2015-11-11 DIAGNOSIS — E782 Mixed hyperlipidemia: Secondary | ICD-10-CM | POA: Diagnosis not present

## 2015-11-13 DIAGNOSIS — I10 Essential (primary) hypertension: Secondary | ICD-10-CM | POA: Diagnosis not present

## 2015-11-13 DIAGNOSIS — R21 Rash and other nonspecific skin eruption: Secondary | ICD-10-CM | POA: Diagnosis not present

## 2015-11-13 DIAGNOSIS — M059 Rheumatoid arthritis with rheumatoid factor, unspecified: Secondary | ICD-10-CM | POA: Diagnosis not present

## 2015-11-13 DIAGNOSIS — D72819 Decreased white blood cell count, unspecified: Secondary | ICD-10-CM | POA: Diagnosis not present

## 2015-11-13 DIAGNOSIS — D509 Iron deficiency anemia, unspecified: Secondary | ICD-10-CM | POA: Diagnosis not present

## 2015-11-13 DIAGNOSIS — R944 Abnormal results of kidney function studies: Secondary | ICD-10-CM | POA: Diagnosis not present

## 2016-02-03 ENCOUNTER — Encounter: Payer: Self-pay | Admitting: Cardiology

## 2016-02-17 DIAGNOSIS — Z79899 Other long term (current) drug therapy: Secondary | ICD-10-CM | POA: Diagnosis not present

## 2016-02-17 DIAGNOSIS — M069 Rheumatoid arthritis, unspecified: Secondary | ICD-10-CM | POA: Diagnosis not present

## 2016-02-24 ENCOUNTER — Encounter: Payer: Self-pay | Admitting: Cardiology

## 2016-02-24 ENCOUNTER — Encounter (INDEPENDENT_AMBULATORY_CARE_PROVIDER_SITE_OTHER): Payer: Self-pay

## 2016-02-24 ENCOUNTER — Ambulatory Visit (INDEPENDENT_AMBULATORY_CARE_PROVIDER_SITE_OTHER): Payer: Medicare HMO | Admitting: Cardiology

## 2016-02-24 VITALS — BP 116/68 | HR 50 | Ht 67.0 in | Wt 164.2 lb

## 2016-02-24 DIAGNOSIS — I4510 Unspecified right bundle-branch block: Secondary | ICD-10-CM

## 2016-02-24 DIAGNOSIS — I10 Essential (primary) hypertension: Secondary | ICD-10-CM

## 2016-02-24 DIAGNOSIS — I444 Left anterior fascicular block: Secondary | ICD-10-CM | POA: Diagnosis not present

## 2016-02-24 DIAGNOSIS — R9431 Abnormal electrocardiogram [ECG] [EKG]: Secondary | ICD-10-CM

## 2016-02-24 NOTE — Progress Notes (Signed)
[facility]. [address], [address] [place], [place]  [address] Phone: [phone] Fax:  [phone]  Date:  02/24/2016   ID:  Bethany Grant, DOB 25-Aug-1940, MRN [id number]  PCP:  Wende Neighbors, MD   History of Present Illness: Bethany Grant is a 75 y.o. female here for follow up to evaluate abnormal EKG during preoperative visits at [facility]. Currently with no symptoms. Has a history of hyperlipidemia, hypertension and rheumatoid arthritis.  EKG demonstrated RBBB, LAFB, bifascicular block. PACs noted. Asymptomatic.  She has no early family history of CAD. She used to smoke in the distant past. She does have rheumatoid arthritis. Previous carpal tunnel surgery, cyst removal.  She exercises. Enjoys gardening. No syncope.  Her husband had stent placement after cardiac catheterization. He was having similar symptoms of fatigue. She is concerned because she feels tired. Could be anemia, could be methotrexate could be rheumatoid arthritis.  Nuclear stress test in 2016 was reassuring, low risk.   Wt Readings from Last 3 Encounters:  02/24/16 164 lb 3.2 oz (74.5 kg)  07/01/15 169 lb (76.7 kg)  01/09/15 164 lb (74.4 kg)     Past Medical History:  Diagnosis Date  . DUB (dysfunctional uterine bleeding)   . Endometriosis   . Fibroid   . Hypercholesterolemia   . Hypertension   . Rheumatoid arthritis(714.0)     Past Surgical History:  Procedure Laterality Date  . ABDOMINAL HYSTERECTOMY  1991   TAH/BSO  . BARTHOLIN GLAND CYST EXCISION  1980   RIGHT  . BREAST SURGERY  1988   BREAST CYST  . COLONOSCOPY    . COLONOSCOPY N/A 08/24/2013   Procedure: COLONOSCOPY;  Surgeon: Rogene Houston, MD;  Location[facility];  Service: Endoscopy;  Laterality: N/A;  100  . FOOT SURGERY     2 TIMES  . FOOT SURGERY Left   . HAND SURGERY    . HEMORRHOID SURGERY    . OLECRANON BURSECTOMY Right 11/29/2013   Procedure: EXCISION OLECRANON BURSA RIGHT ;  Surgeon: Wynonia Sours, MD;   Location: [facility];  Service: Orthopedics;  Laterality: Right;  . OOPHORECTOMY     BSO  . TENOSYNOVECTOMY Right 11/29/2013   Procedure: TENOSYNOVECTOMY FLEXOR TENDON RIGHT WRIST ;  Surgeon: Wynonia Sours, MD;  Location: [facility];  Service: Orthopedics;  Laterality: Right;    Current Outpatient Prescriptions  Medication Sig Dispense Refill  . ALPRAZolam (XANAX) 0.25 MG tablet Take 0.25 mg by mouth at bedtime as needed for sleep.    Marland Kitchen aspirin 81 MG tablet Take 81 mg by mouth daily.    . cycloSPORINE (RESTASIS) 0.05 % ophthalmic emulsion Place 1 drop into both eyes 2 (two) times daily.     . folic acid (FOLVITE) 1 MG tablet Take 1 mg by mouth daily.    . hydroxychloroquine (PLAQUENIL) 200 MG tablet Take 400 mg by mouth daily.     Marland Kitchen losartan-hydrochlorothiazide (HYZAAR) 100-12.5 MG per tablet Take 1 tablet by mouth daily.    . methotrexate (RHEUMATREX) 2.5 MG tablet Take 3 tablet on Fri and 2 tablets on Sat    . VITAMIN D, CHOLECALCIFEROL, PO Take 2,000 Int'l Units by mouth daily.      No current facility-administered medications for this visit.     Allergies:    Allergies  Allergen Reactions  . Other Nausea And Vomiting    Doesn't do well with pain  Medications, sensitivity  to tape    Social History:  The patient  reports that she quit smoking about 11 years ago. Her smoking use included Cigarettes. She has a 24.00 pack-year smoking history. She has never used smokeless tobacco. She reports that she does not drink alcohol or use drugs.   Family History  Problem Relation Age of Onset  . Cancer Mother     LUNG  . Hypertension Sister   . Cancer Sister     LUNG, THYROID, UTERINE  . Cancer Brother     LUNG  . Cancer Sister     Lung cancer  . Colon cancer Neg Hx     ROS:  Please see the history of present illness.   Denies any fevers, chills, orthopnea, PND, syncope, chest pain, shortness of breath, bleeding   All other systems reviewed and negative.     PHYSICAL EXAM: VS:  BP 116/68   Pulse (!) 50   Ht 5\' 7"  (1.702 m)   Wt 164 lb 3.2 oz (74.5 kg)   BMI 25.72 kg/m  Well nourished, well developed, in no acute distress HEENT: normal, [place]/AT, EOMI Neck: no JVD, normal carotid upstroke, no bruit Cardiac:  normal S1, S2; RRR; no murmur Lungs:  clear to auscultation bilaterally, no wheezing, rhonchi or rales Abd: soft, nontender, no hepatomegaly, no bruits Ext: no edema, 2+ distal pulses Skin: warm and dry GU: deferred Neuro: no focal abnormalities noted, AAO x 3  EKG:  Today, 02/24/16-sinus bradycardia 58, left anterior fascicular block, right bundle branch block-bifascicular block personally viewed-no significant change from prior 12/20/14-sinus bradycardia rate 57) a branch block left anterior fascicular block bifascicular block-previous EKG 11/28/13-sinus rhythm, PAC, right bundle branch block, left anterior fascicular block, bifascicular block  Labs: Creatinine 1.07, potassium 4.3, LDL 99, HDL 46, TSH 2.5, CK 33   Prior medical records reviewed  ASSESSMENT AND PLAN:  1. Right bundle branch block, left anterior fascicular block, bifascicular block-she is at increased risk for worsening conduction disease, pacemaker in the future. Avoid beta blockers, calcium channel blockers or other AV nodal blocking agents. Previously, she had had workup by Renato Shin, M.D. which included stress test. This was reassuring. NUC stress 01/08/14-Small, mild inferoseptal, fixed defect. Suspect bundle branch related defect. This is a low risk study. Overall left ventricular systolic function was normal - LVEF 73%. LV cavity size is normal. Overall reassuring study.  2. Hypertension-currently well controlled. Medications reviewed. 3. I will see her back in one year, check EKG at that time. She knows to contact me if any worrisome symptoms develop.  Signed, Candee Furbish, MD [facility]  02/24/2016 2:09 PM

## 2016-02-24 NOTE — Patient Instructions (Signed)

## 2016-03-11 DIAGNOSIS — M79671 Pain in right foot: Secondary | ICD-10-CM | POA: Diagnosis not present

## 2016-03-11 DIAGNOSIS — M79642 Pain in left hand: Secondary | ICD-10-CM | POA: Diagnosis not present

## 2016-03-11 DIAGNOSIS — M0570 Rheumatoid arthritis with rheumatoid factor of unspecified site without organ or systems involvement: Secondary | ICD-10-CM | POA: Diagnosis not present

## 2016-03-11 DIAGNOSIS — M79672 Pain in left foot: Secondary | ICD-10-CM | POA: Diagnosis not present

## 2016-03-11 DIAGNOSIS — M79641 Pain in right hand: Secondary | ICD-10-CM | POA: Diagnosis not present

## 2016-03-16 ENCOUNTER — Ambulatory Visit: Payer: Medicare Other | Admitting: Pulmonary Disease

## 2016-03-25 ENCOUNTER — Encounter: Payer: Self-pay | Admitting: Gynecology

## 2016-03-25 ENCOUNTER — Ambulatory Visit (INDEPENDENT_AMBULATORY_CARE_PROVIDER_SITE_OTHER): Payer: Medicare HMO | Admitting: Gynecology

## 2016-03-25 VITALS — BP 132/76

## 2016-03-25 DIAGNOSIS — Z79890 Hormone replacement therapy (postmenopausal): Secondary | ICD-10-CM | POA: Diagnosis not present

## 2016-03-25 DIAGNOSIS — N9089 Other specified noninflammatory disorders of vulva and perineum: Secondary | ICD-10-CM | POA: Diagnosis not present

## 2016-03-25 DIAGNOSIS — N905 Atrophy of vulva: Secondary | ICD-10-CM

## 2016-03-25 MED ORDER — ESTRADIOL 0.1 MG/GM VA CREA: 2.0000 g | TOPICAL_CREAM | VAGINAL | 8 refills | Status: DC

## 2016-03-25 MED ORDER — CLOBETASOL PROPIONATE 0.05 % EX CREA: 1.0000 "application " | TOPICAL_CREAM | Freq: Two times a day (BID) | CUTANEOUS | 5 refills | Status: DC

## 2016-03-25 MED ORDER — FLUCONAZOLE 150 MG PO TABS: 150.0000 mg | ORAL_TABLET | Freq: Once | ORAL | 0 refills | Status: AC

## 2016-03-25 NOTE — Progress Notes (Signed)
HPI: Patient is 75 year old presented to the office today complaining of vulvar irritation. Patient had this issue before and had been placed on clobetasol 0.05% twice a day for 10-14 days. She was applying to 3 times a week but the symptoms come and go. She has changed her detergents and soaps. Patient not sexually active. Patient denies any fever, chills, nausea, vomiting, patient would no back pain, patient with no vaginal discharge or any GI complaints.    ROS: A ROS was performed and pertinent positives and negatives are included in the history.  GENERAL: No fevers or chills. HEENT: No change in vision, no earache, sore throat or sinus congestion. NECK: No pain or stiffness. CARDIOVASCULAR: No chest pain or pressure. No palpitations. PULMONARY: No shortness of breath, cough or wheeze. GASTROINTESTINAL: No abdominal pain, nausea, vomiting or diarrhea, melena or bright red blood per rectum. GENITOURINARY: No urinary frequency, urgency, hesitancy or dysuria. MUSCULOSKELETAL: No joint or muscle pain, no back pain, no recent trauma. DERMATOLOGIC: See above ENDOCRINE: No polyuria, polydipsia, no heat or cold intolerance. No recent change in weight. HEMATOLOGICAL: No anemia or easy bruising or bleeding. NEUROLOGIC: No headache, seizures, numbness, tingling or weakness. PSYCHIATRIC: No depression, no loss of interest in normal activity or change in sleep pattern.   PE: Blood pressure 132/76 Bartholin palpable nursing with the above-mentioned complaint Pelvic: Bartholin urethra Skene was within normal limits External genitalia with atrophic changes excoriated areas were noted and one vesicular area that was questionable HSV so a culture was obtained. Speculum exam no lesions or discharge seen only atrophic changes   Assessment Plan: With vulvar irritation probably attributed to atrophic vulva. Patient will be placed on the following regimen: Estrace vaginal cream to apply externally on Tuesday, Saturday,  and Sunday and the clobetasol 0.05% on Monday, Wednesday, and Friday. I'm also giving give her prescription Diflucan 150 mg to take 1 tablet by mouth today repeat in 48 hours. If symptoms continue after a month after the above regimen she is going to be followed by the dermatologist. Risk benefits and pros and cons of medication were discussed.    Greater than 50% of time was spent in counseling and coordinating care of this patient.   Time of consultation: 15   Minutes.

## 2016-03-25 NOTE — Patient Instructions (Addendum)
Clobetasol Propionate skin cream What is this medicine? CLOBETASOL (kloe BAY ta sol) is a corticosteroid. It is used on the skin to treat itching, redness, and swelling caused by some skin conditions. This medicine may be used for other purposes; ask your health care provider or pharmacist if you have questions. What should I tell my health care provider before I take this medicine? They need to know if you have any of these conditions: -any type of active infection including measles, tuberculosis, herpes, or chickenpox -circulation problems or vascular disease -large areas of burned or damaged skin -rosacea -skin wasting or thinning -an unusual or allergic reaction to clobetasol, corticosteroids, other medicines, foods, dyes, or preservatives -pregnant or trying to get pregnant -breast-feeding How should I use this medicine? This medicine is for external use only. Do not take by mouth. Follow the directions on the prescription label. Wash your hands before and after use. Apply a thin film of medicine to the affected area. Do not cover with a bandage or dressing unless your doctor or health care professional tells you to. Do not get this medicine in your eyes. If you do, rinse out with plenty of cool tap water. It is important not to use more medicine than prescribed. Do not use your medicine more often than directed. To do so may increase the chance of side effects. Talk to your pediatrician regarding the use of this medicine in children. Special care may be needed. Elderly patients are more likely to have damaged skin through aging, and this may increase side effects. This medicine should only be used for brief periods and infrequently in older patients. Overdosage: If you think you have taken too much of this medicine contact a poison control center or emergency room at once. NOTE: This medicine is only for you. Do not share this medicine with others. What if I miss a dose? If you miss a  dose, use it as soon as you can. If it is almost time for your next dose, use only that dose. Do not use double or extra doses. What may interact with this medicine? Interactions are not expected. Do not use cosmetics or other skin care products on the treated area. This list may not describe all possible interactions. Give your health care provider a list of all the medicines, herbs, non-prescription drugs, or dietary supplements you use. Also tell them if you smoke, drink alcohol, or use illegal drugs. Some items may interact with your medicine. What should I watch for while using this medicine? Tell your doctor or health care professional if your symptoms do not get better within 2 weeks, or if you develop skin irritation from the medicine. Tell your doctor or health care professional if you are exposed to anyone with measles or chickenpox, or if you develop sores or blisters that do not heal properly. What side effects may I notice from receiving this medicine? Side effects that you should report to your doctor or health care professional as soon as possible: -allergic reactions like skin rash, itching or hives, swelling of the face, lips, or tongue -changes in vision -lack of healing of the skin condition -painful, red, pus filled blisters on the skin or in hair follicles -thinning of the skin with easy bruising Side effects that usually do not require medical attention (report to your doctor or health care professional if they continue or are bothersome): -burning, irritation of the skin -redness or scaling of the skin This list may not describe all  possible side effects. Call your doctor for medical advice about side effects. You may report side effects to FDA at 1-800-FDA-1088. Where should I keep my medicine? Keep out of the reach of children. Store at room temperature between 15 and 30 degrees C (59 and 86 degrees F). Keep away from heat and direct light. Do not freeze. Throw away any  unused medicine after the expiration date. NOTE: This sheet is a summary. It may not cover all possible information. If you have questions about this medicine, talk to your doctor, pharmacist, or health care provider.    2016, Elsevier/Gold Standard. (2007-10-19 16:56:45) Estradiol vaginal cream What is this medicine? ESTRADIOL (es tra DYE ole) contains the female hormone estrogen. It is used for symptoms of menopause, like vaginal dryness and irritation. This medicine may be used for other purposes; ask your health care provider or pharmacist if you have questions. What should I tell my health care provider before I take this medicine? They need to know if you have any of these conditions: -abnormal vaginal bleeding -blood vessel disease or blood clots -breast, cervical, endometrial, ovarian, liver, or uterine cancer -dementia -diabetes -gallbladder disease -heart disease or recent heart attack -high blood pressure -high cholesterol -high levels of calcium in the blood -hysterectomy -kidney disease -liver disease -migraine headaches -protein C deficiency -protein S deficiency -stroke -systemic lupus erythematosus (SLE) -tobacco smoker -an unusual or allergic reaction to estrogens, other hormones, soy, other medicines, foods, dyes, or preservatives -pregnant or trying to get pregnant -breast-feeding How should I use this medicine? This medicine is for use in the vagina only. Do not take by mouth. Follow the directions on the prescription label. Read package directions carefully before using. Use the special applicator supplied with the cream. Wash hands before and after use. Fill the applicator with the prescribed amount of cream. Lie on your back, part and bend your knees. Insert the applicator into the vagina and push the plunger to expel the cream into the vagina. Wash the applicator with warm soapy water and rinse well. Use exactly as directed for the complete length of time  prescribed. Do not stop using except on the advice of your doctor or health care professional. A patient package insert for the product will be given with each prescription and refill. Read this sheet carefully each time. The sheet may change frequently. Talk to your pediatrician regarding the use of this medicine in children. This medicine is not approved for use in children. Overdosage: If you think you have taken too much of this medicine contact a poison control center or emergency room at once. NOTE: This medicine is only for you. Do not share this medicine with others. What if I miss a dose? If you miss a dose, use it as soon as you can. If it is almost time for your next dose, use only that dose. Do not use double or extra doses. What may interact with this medicine? Do not take this medicine with any of the following medications: -aromatase inhibitors like aminoglutethimide, anastrozole, exemestane, letrozole, testolactone This medicine may also interact with the following medications: -barbiturates used for inducing sleep or treating seizures -carbamazepine -grapefruit juice -medicines for fungal infections like ketoconazole and itraconazole -raloxifene -rifabutin -rifampin -rifapentine -ritonavir -some antibiotics used to treat infections -St. John's Wort -tamoxifen -warfarin This list may not describe all possible interactions. Give your health care provider a list of all the medicines, herbs, non-prescription drugs, or dietary supplements you use. Also tell them if  you smoke, drink alcohol, or use illegal drugs. Some items may interact with your medicine. What should I watch for while using this medicine? Visit your health care professional for regular checks on your progress. You will need a regular breast and pelvic exam. You should also discuss the need for regular mammograms with your health care professional, and follow his or her guidelines. This medicine can make your  body retain fluid, making your fingers, hands, or ankles swell. Your blood pressure can go up. Contact your doctor or health care professional if you feel you are retaining fluid. If you have any reason to think you are pregnant, stop taking this medicine at once and contact your doctor or health care professional. Tobacco smoking increases the risk of getting a blood clot or having a stroke, especially if you are more than 73 years old. You are strongly advised not to smoke. If you wear contact lenses and notice visual changes, or if the lenses begin to feel uncomfortable, consult your eye care specialist. If you are going to have elective surgery, you may need to stop taking this medicine beforehand. Consult your health care professional for advice prior to scheduling the surgery. What side effects may I notice from receiving this medicine? Side effects that you should report to your doctor or health care professional as soon as possible: -allergic reactions like skin rash, itching or hives, swelling of the face, lips, or tongue -breast tissue changes or discharge -changes in vision -chest pain -confusion, trouble speaking or understanding -dark urine -general ill feeling or flu-like symptoms -light-colored stools -nausea, vomiting -pain, swelling, warmth in the leg -right upper belly pain -severe headaches -shortness of breath -sudden numbness or weakness of the face, arm or leg -trouble walking, dizziness, loss of balance or coordination -unusual vaginal bleeding -yellowing of the eyes or skin Side effects that usually do not require medical attention (report to your doctor or health care professional if they continue or are bothersome): -hair loss -increased hunger or thirst -increased urination -symptoms of vaginal infection like itching, irritation or unusual discharge -unusually weak or tired This list may not describe all possible side effects. Call your doctor for medical  advice about side effects. You may report side effects to FDA at 1-800-FDA-1088. Where should I keep my medicine? Keep out of the reach of children. Store at room temperature between 15 and 30 degrees C (59 and 86 degrees F). Protect from temperatures above 40 degrees C (104 degrees C). Do not freeze. Throw away any unused medicine after the expiration date. NOTE: This sheet is a summary. It may not cover all possible information. If you have questions about this medicine, talk to your doctor, pharmacist, or health care provider.    2016, Elsevier/Gold Standard. (2010-10-15 09:18:12)

## 2016-03-25 NOTE — Addendum Note (Signed)
Addended by: Thurnell Garbe A on: 03/25/2016 12:45 PM   Modules accepted: Orders

## 2016-03-27 LAB — HERPES SIMPLEX VIRUS CULTURE: ORGANISM ID, BACTERIA: NOT DETECTED

## 2016-04-30 DIAGNOSIS — D1801 Hemangioma of skin and subcutaneous tissue: Secondary | ICD-10-CM | POA: Diagnosis not present

## 2016-04-30 DIAGNOSIS — L309 Dermatitis, unspecified: Secondary | ICD-10-CM | POA: Diagnosis not present

## 2016-05-04 ENCOUNTER — Other Ambulatory Visit (HOSPITAL_COMMUNITY): Payer: Self-pay | Admitting: Internal Medicine

## 2016-05-04 DIAGNOSIS — Z1231 Encounter for screening mammogram for malignant neoplasm of breast: Secondary | ICD-10-CM

## 2016-05-11 DIAGNOSIS — E782 Mixed hyperlipidemia: Secondary | ICD-10-CM | POA: Diagnosis not present

## 2016-05-18 DIAGNOSIS — D509 Iron deficiency anemia, unspecified: Secondary | ICD-10-CM | POA: Diagnosis not present

## 2016-05-18 DIAGNOSIS — I10 Essential (primary) hypertension: Secondary | ICD-10-CM | POA: Diagnosis not present

## 2016-05-18 DIAGNOSIS — R944 Abnormal results of kidney function studies: Secondary | ICD-10-CM | POA: Diagnosis not present

## 2016-05-18 DIAGNOSIS — Z23 Encounter for immunization: Secondary | ICD-10-CM | POA: Diagnosis not present

## 2016-05-18 DIAGNOSIS — D72819 Decreased white blood cell count, unspecified: Secondary | ICD-10-CM | POA: Diagnosis not present

## 2016-05-18 DIAGNOSIS — Z6826 Body mass index (BMI) 26.0-26.9, adult: Secondary | ICD-10-CM | POA: Diagnosis not present

## 2016-05-18 DIAGNOSIS — M059 Rheumatoid arthritis with rheumatoid factor, unspecified: Secondary | ICD-10-CM | POA: Diagnosis not present

## 2016-05-18 DIAGNOSIS — G4700 Insomnia, unspecified: Secondary | ICD-10-CM | POA: Diagnosis not present

## 2016-05-20 ENCOUNTER — Ambulatory Visit (INDEPENDENT_AMBULATORY_CARE_PROVIDER_SITE_OTHER): Payer: Medicare HMO | Admitting: Pulmonary Disease

## 2016-05-20 ENCOUNTER — Ambulatory Visit (INDEPENDENT_AMBULATORY_CARE_PROVIDER_SITE_OTHER)
Admission: RE | Admit: 2016-05-20 | Discharge: 2016-05-20 | Disposition: A | Discharge status: Home / Self Care | Payer: Medicare HMO | Source: Ambulatory Visit | Attending: Pulmonary Disease | Admitting: Pulmonary Disease

## 2016-05-20 ENCOUNTER — Encounter: Payer: Self-pay | Admitting: Pulmonary Disease

## 2016-05-20 VITALS — BP 128/78 | HR 69 | Ht 67.0 in | Wt 166.0 lb

## 2016-05-20 DIAGNOSIS — R938 Abnormal findings on diagnostic imaging of other specified body structures: Secondary | ICD-10-CM | POA: Diagnosis not present

## 2016-05-20 DIAGNOSIS — R9389 Abnormal findings on diagnostic imaging of other specified body structures: Secondary | ICD-10-CM

## 2016-05-20 DIAGNOSIS — R0789 Other chest pain: Secondary | ICD-10-CM | POA: Diagnosis not present

## 2016-05-20 NOTE — Patient Instructions (Signed)
Follow up in 1 year with chest xray

## 2016-05-20 NOTE — Progress Notes (Signed)
Current Outpatient Prescriptions on File Prior to Visit  Medication Sig  . ALPRAZolam (XANAX) 0.25 MG tablet Take 0.25 mg by mouth at bedtime as needed for sleep.  Marland Kitchen aspirin 81 MG tablet Take 81 mg by mouth daily.  . clobetasol cream (TEMOVATE) [id number] % Apply 1 application topically 2 (two) times daily. Apply external genitalia on Monday, Wednesday and Fridays  . cycloSPORINE (RESTASIS) 0.05 % ophthalmic emulsion Place 1 drop into both eyes 2 (two) times daily.   . folic acid (FOLVITE) 1 MG tablet Take 1 mg by mouth daily.  . hydroxychloroquine (PLAQUENIL) 200 MG tablet Take 400 mg by mouth daily.   Marland Kitchen losartan-hydrochlorothiazide (HYZAAR) 100-12.5 MG per tablet Take 1 tablet by mouth daily.  . methotrexate (RHEUMATREX) 2.5 MG tablet Take 3 tablet on Fri and 2 tablets on Sat  . VITAMIN D, CHOLECALCIFEROL, PO Take 2,000 Int'l Units by mouth daily.    No current facility-administered medications on file prior to visit.      Chief Complaint  Patient presents with  . Follow-up    CXR today prior to visit. Pt c/o chest heaviness. Pt had her flu shot on Monday at her PCP and she has had chest discomfort since along with redness extending from the injection site down her arm.     Tests CT chest 01/25/08 >> 4 mm Lt lower lobe nodule, hazy reticular infiltrate LLL CT chest 04/18/08 >> increase in LLL reticular infiltrate Bronchoscopy Oct 2009>>Pneumatocyte atypia suggestive of obstructive pneumonitis, can be seen with RA in the lungs PFT 06/19/08 >> FEV1 2.58(107%), FEV1% 71, TLC 6.07(111%), DLCO 96%, no BD CT chest 05/23/09 >> 3.8 x 3.6 cm ascending aorta, scoliosis, decreased size LLL nodule, reticular changes LLL decreased  Past medical history Endometriosis, HTN, HLD, RA  Past surgical history, Family history, Social history, Allergies all reviewed.  Vital Signs BP 128/78 (BP Location: Left Arm, Cuff Size: Normal)   Pulse 69   Ht 5\' 7"  (1.702 m)   Wt 166 lb (75.3 kg)   SpO2 97%   BMI  26.00 kg/m   History of Present Illness KYLINN DILULLO is a 75 y.o. female former smoker with lung nodules and family history of lung cancer.    She had flu shot two days ago.  Lt shoulder still sore.  She has noticed rhinitis, cough and some chest congestion over past few days.  She is prone to getting allergies in the late Fall.  She denies fever, wheeze, sore throat, or hemoptysis.  She is followed by Dr. Charlestine Night for RA.  She is on MTX and plaquenil.  Symptoms have been stable.  Physical Exam  General - No distress ENT - No sinus tenderness, no oral exudate, no LAN Cardiac - s1s2 regular, no murmur Chest - No wheeze/rales/dullness Back - No focal tenderness Abd - Soft, non-tender Ext - changes of RA in MCP joints b/l Neuro - Normal strength Skin - No rashes Psych - normal mood, and behavior   Dg Chest 2 View  Result Date: 05/20/2016 CLINICAL DATA:  Chest heaviness for 2 days EXAM: CHEST  2 VIEW COMPARISON:  Radiograph 10/12/2013 FINDINGS: Normal mediastinum and cardiac silhouette. Normal pulmonary vasculature. No evidence of effusion, infiltrate, or pneumothorax. No acute bony abnormality. Lungs are hyperinflated. IMPRESSION: Hyperinflated lungs.  No acute findings. Electronically Signed   By: Suzy Bouchard M.D.   On: 05/20/2016 14:37    Assessment/Plan  History of tobacco abuse, and family history of lung cancer with prior imaging  studies showing reticulonodular infiltrates in setting of rheumatoid arthritis. - she is concerned about risk of lung cancer and would like annual CXR - repeat CXR at follow up in 1 year  Left arm pain after influenza vaccination earlier this week. - conservative management with OTC analgesics prn  Cough. - likely from seasonal allergies - OTC antihistamines prn   Patient Instructions  Follow up in 1 year with chest xray    Chesley Mires, MD [facility] Pager:  [phone] 05/20/2016, 3:03 PM

## 2016-06-01 ENCOUNTER — Ambulatory Visit (HOSPITAL_COMMUNITY)
Admission: RE | Admit: 2016-06-01 | Discharge: 2016-06-01 | Disposition: A | Discharge status: Home / Self Care | Payer: Medicare HMO | Source: Ambulatory Visit | Attending: Internal Medicine | Admitting: Internal Medicine

## 2016-06-01 DIAGNOSIS — Z1231 Encounter for screening mammogram for malignant neoplasm of breast: Secondary | ICD-10-CM | POA: Diagnosis not present

## 2016-06-15 DIAGNOSIS — L298 Other pruritus: Secondary | ICD-10-CM | POA: Diagnosis not present

## 2016-06-15 DIAGNOSIS — L821 Other seborrheic keratosis: Secondary | ICD-10-CM | POA: Diagnosis not present

## 2016-06-15 DIAGNOSIS — L853 Xerosis cutis: Secondary | ICD-10-CM | POA: Diagnosis not present

## 2016-06-23 DIAGNOSIS — J029 Acute pharyngitis, unspecified: Secondary | ICD-10-CM | POA: Diagnosis not present

## 2016-06-23 DIAGNOSIS — H6091 Unspecified otitis externa, right ear: Secondary | ICD-10-CM | POA: Diagnosis not present

## 2016-07-03 ENCOUNTER — Encounter: Payer: Medicare HMO | Admitting: Gynecology

## 2016-07-06 ENCOUNTER — Ambulatory Visit (INDEPENDENT_AMBULATORY_CARE_PROVIDER_SITE_OTHER): Payer: Medicare HMO | Admitting: Gynecology

## 2016-07-06 ENCOUNTER — Encounter: Payer: Self-pay | Admitting: Gynecology

## 2016-07-06 VITALS — BP 128/82 | Ht 66.5 in | Wt 166.0 lb

## 2016-07-06 DIAGNOSIS — Z01419 Encounter for gynecological examination (general) (routine) without abnormal findings: Secondary | ICD-10-CM

## 2016-07-06 DIAGNOSIS — M8580 Other specified disorders of bone density and structure, unspecified site: Secondary | ICD-10-CM

## 2016-07-06 NOTE — Progress Notes (Signed)
Bethany Grant 1940/08/15 [id number]   History:    75 y.o.  for annual gyn exam with no complaints today. Her PCP has been doing her blood work and her vaccines are up-to-date. Patient had a bone density study in 2016 the lowest T score was at the [facility] with a value of -2.2. Patient had a normal colonoscopy in 2014. Patient with prior history of TAH/BSO secondary to fibroids and endometriosis several years ago.Many years ago she had: koilocytic atypia on Pap but has had normal Paps for greater than 20 years. She had a normal Pap last year. She has no vaginal bleeding. She has no pelvic pain. She sees a pulmonologist regularly because of her strong family history of lung cancer and her severe history of rheumatoid arthritis.   Past medical history,surgical history, family history and social history were all reviewed and documented in the EPIC chart.  Gynecologic History No LMP recorded. Patient has had a hysterectomy. Contraception: status post hysterectomy Last Pap: 2012. Results were: normal Last mammogram: 2017. Results were: normal  Obstetric History OB History  Gravida Para Term Preterm AB Living  3 3 3     3   SAB TAB Ectopic Multiple Live Births               # Outcome Date GA Lbr Len/2nd Weight Sex Delivery Anes PTL Lv  3 Term           2 Term           1 Term                ROS: A ROS was performed and pertinent positives and negatives are included in the history.  GENERAL: No fevers or chills. HEENT: No change in vision, no earache, sore throat or sinus congestion. NECK: No pain or stiffness. CARDIOVASCULAR: No chest pain or pressure. No palpitations. PULMONARY: No shortness of breath, cough or wheeze. GASTROINTESTINAL: No abdominal pain, nausea, vomiting or diarrhea, melena or bright red blood per rectum. GENITOURINARY: No urinary frequency, urgency, hesitancy or dysuria. MUSCULOSKELETAL: No joint or muscle pain, no back pain, no recent trauma. DERMATOLOGIC: No rash, no  itching, no lesions. ENDOCRINE: No polyuria, polydipsia, no heat or cold intolerance. No recent change in weight. HEMATOLOGICAL: No anemia or easy bruising or bleeding. NEUROLOGIC: No headache, seizures, numbness, tingling or weakness. PSYCHIATRIC: No depression, no loss of interest in normal activity or change in sleep pattern.     Exam: chaperone present  BP 128/82   Ht 5' 6.5" (1.689 m)   Wt 166 lb (75.3 kg)   BMI 26.39 kg/m   Body mass index is 26.39 kg/m.  General appearance : Well developed well nourished female. No acute distress HEENT: Eyes: no retinal hemorrhage or exudates,  Neck supple, trachea midline, no carotid bruits, no thyroidmegaly Lungs: Clear to auscultation, no rhonchi or wheezes, or rib retractions  Heart: Regular rate and rhythm, no murmurs or gallops Breast:Examined in sitting and supine position were symmetrical in appearance, no palpable masses or tenderness,  no skin retraction, no nipple inversion, no nipple discharge, no skin discoloration, no axillary or supraclavicular lymphadenopathy Abdomen: no palpable masses or tenderness, no rebound or guarding Extremities: no edema or skin discoloration or tenderness  Pelvic:  Bartholin, Urethra, Skene Glands: Within normal limits             Vagina: No gross lesions or discharge  Cervix: Absent  Uterus  absent  Adnexa  Without masses or  tenderness  Anus and perineum  normal   Rectovaginal  normal sphincter tone without palpated masses or tenderness             Hemoccult PCP provides     Assessment/Plan:  75 y.o. female for annual exam with no longer on vaginal estrogen. Patient otherwise asymptomatic. Pap smear no longer indicated according to new guidelines. Patient will need a bone density study next year. PCP has been doing her blood work and her vaccines. We discussed importance of calcium vitamin D and weightbearing exercises for osteoporosis prevention.   Terrance Mass MD, 10:54 AM 07/06/2016

## 2016-07-28 DIAGNOSIS — Z419 Encounter for procedure for purposes other than remedying health state, unspecified: Secondary | ICD-10-CM | POA: Diagnosis not present

## 2016-07-28 DIAGNOSIS — L308 Other specified dermatitis: Secondary | ICD-10-CM | POA: Diagnosis not present

## 2016-08-24 DIAGNOSIS — Z79899 Other long term (current) drug therapy: Secondary | ICD-10-CM | POA: Diagnosis not present

## 2016-08-24 DIAGNOSIS — H2513 Age-related nuclear cataract, bilateral: Secondary | ICD-10-CM | POA: Diagnosis not present

## 2016-08-24 DIAGNOSIS — M069 Rheumatoid arthritis, unspecified: Secondary | ICD-10-CM | POA: Diagnosis not present

## 2016-08-24 DIAGNOSIS — H5203 Hypermetropia, bilateral: Secondary | ICD-10-CM | POA: Diagnosis not present

## 2016-08-24 DIAGNOSIS — H52203 Unspecified astigmatism, bilateral: Secondary | ICD-10-CM | POA: Diagnosis not present

## 2016-08-24 DIAGNOSIS — H524 Presbyopia: Secondary | ICD-10-CM | POA: Diagnosis not present

## 2016-08-26 DIAGNOSIS — M79672 Pain in left foot: Secondary | ICD-10-CM | POA: Diagnosis not present

## 2016-08-26 DIAGNOSIS — Z79899 Other long term (current) drug therapy: Secondary | ICD-10-CM | POA: Diagnosis not present

## 2016-08-26 DIAGNOSIS — M25441 Effusion, right hand: Secondary | ICD-10-CM | POA: Diagnosis not present

## 2016-08-26 DIAGNOSIS — M79671 Pain in right foot: Secondary | ICD-10-CM | POA: Diagnosis not present

## 2016-08-26 DIAGNOSIS — M0570 Rheumatoid arthritis with rheumatoid factor of unspecified site without organ or systems involvement: Secondary | ICD-10-CM | POA: Diagnosis not present

## 2016-08-26 DIAGNOSIS — M25442 Effusion, left hand: Secondary | ICD-10-CM | POA: Diagnosis not present

## 2016-08-27 DIAGNOSIS — M0570 Rheumatoid arthritis with rheumatoid factor of unspecified site without organ or systems involvement: Secondary | ICD-10-CM | POA: Diagnosis not present

## 2016-08-27 DIAGNOSIS — Z79899 Other long term (current) drug therapy: Secondary | ICD-10-CM | POA: Diagnosis not present

## 2016-11-09 DIAGNOSIS — D509 Iron deficiency anemia, unspecified: Secondary | ICD-10-CM | POA: Diagnosis not present

## 2016-11-09 DIAGNOSIS — I10 Essential (primary) hypertension: Secondary | ICD-10-CM | POA: Diagnosis not present

## 2016-11-16 DIAGNOSIS — D509 Iron deficiency anemia, unspecified: Secondary | ICD-10-CM | POA: Diagnosis not present

## 2016-11-16 DIAGNOSIS — I10 Essential (primary) hypertension: Secondary | ICD-10-CM | POA: Diagnosis not present

## 2016-11-16 DIAGNOSIS — Z6826 Body mass index (BMI) 26.0-26.9, adult: Secondary | ICD-10-CM | POA: Diagnosis not present

## 2016-11-16 DIAGNOSIS — M059 Rheumatoid arthritis with rheumatoid factor, unspecified: Secondary | ICD-10-CM | POA: Diagnosis not present

## 2016-11-16 DIAGNOSIS — R5383 Other fatigue: Secondary | ICD-10-CM | POA: Diagnosis not present

## 2016-11-16 DIAGNOSIS — G4710 Hypersomnia, unspecified: Secondary | ICD-10-CM | POA: Diagnosis not present

## 2016-11-16 DIAGNOSIS — R944 Abnormal results of kidney function studies: Secondary | ICD-10-CM | POA: Diagnosis not present

## 2016-12-09 ENCOUNTER — Encounter: Payer: Self-pay | Admitting: Gynecology

## 2016-12-11 ENCOUNTER — Telehealth: Payer: Self-pay | Admitting: Cardiology

## 2016-12-11 NOTE — Telephone Encounter (Signed)
New Message  Pt c/o Shortness Of Breath: STAT if SOB developed within the last 24 hours or pt is noticeably SOB on the phone  1. Are you currently SOB (can you hear that pt is SOB on the phone)? no  2. How long have you been experiencing SOB? Per pt about a week  3. Are you SOB when sitting or when up moving around? Coming and going   4. Are you currently experiencing any other symptoms? No. Per pt does not know if its her heart or her lungs. Pt has appt with Tera Helper on 5/21 11:30. Please call to discuss

## 2016-12-11 NOTE — Telephone Encounter (Signed)
Returned call to patient-patient reports increased SOB x 1 week with exertion.  Denies swelling/edema, increased salt intake.  Denies CP, dizziness, lightheadedness.  No vitals to report.  Weighs herself sometimes but has not noticed an increase in the last week.  SOB resolves with rest.  Reports she was concerned because she knows she has a RBBB.  No distress noted over the phone.  Patient able to speak in full, complete sentences.   Advised to monitor, watch salt intake.    Appt Monday with L. Servando Snare at 1130.  If symptoms become worse, aware to go to ER to be evaluated.  Patient agreed with plan and verbalized understanding.

## 2016-12-14 ENCOUNTER — Ambulatory Visit (INDEPENDENT_AMBULATORY_CARE_PROVIDER_SITE_OTHER): Payer: Medicare HMO | Admitting: Nurse Practitioner

## 2016-12-14 ENCOUNTER — Encounter: Payer: Self-pay | Admitting: Nurse Practitioner

## 2016-12-14 ENCOUNTER — Encounter (INDEPENDENT_AMBULATORY_CARE_PROVIDER_SITE_OTHER): Payer: Self-pay

## 2016-12-14 ENCOUNTER — Telehealth (HOSPITAL_COMMUNITY): Payer: Self-pay | Admitting: *Deleted

## 2016-12-14 VITALS — BP 116/70 | HR 76 | Ht 67.0 in | Wt 167.8 lb

## 2016-12-14 DIAGNOSIS — I444 Left anterior fascicular block: Secondary | ICD-10-CM

## 2016-12-14 DIAGNOSIS — I4510 Unspecified right bundle-branch block: Secondary | ICD-10-CM

## 2016-12-14 DIAGNOSIS — I10 Essential (primary) hypertension: Secondary | ICD-10-CM

## 2016-12-14 DIAGNOSIS — R9431 Abnormal electrocardiogram [ECG] [EKG]: Secondary | ICD-10-CM | POA: Diagnosis not present

## 2016-12-14 DIAGNOSIS — R5383 Other fatigue: Secondary | ICD-10-CM | POA: Diagnosis not present

## 2016-12-14 LAB — TSH: TSH: 3.81 u[IU]/mL (ref 0.450–4.500)

## 2016-12-14 MED ORDER — NITROGLYCERIN 0.4 MG SL SUBL: 0.4000 mg | SUBLINGUAL_TABLET | SUBLINGUAL | 3 refills | Status: DC | PRN

## 2016-12-14 NOTE — Telephone Encounter (Signed)
Patient given detailed instructions per Myocardial Perfusion Study Information Sheet for the test on  12/16/16. Patient notified to arrive 15 minutes early and that it is imperative to arrive on time for appointment to keep from having the test rescheduled.  If you need to cancel or reschedule your appointment, please call the office within 24 hours of your appointment. . Patient verbalized understanding. Bethany Grant

## 2016-12-14 NOTE — Patient Instructions (Addendum)
We will be checking the following labs today - TSH   Medication Instructions:    Continue with your current medicines. BUT I have sent in a prescription for NTG - Use your NTG under your tongue for recurrent chest pain. May take one tablet every 5 minutes. If you are still having discomfort after 3 tablets in 15 minutes, call 911. This is at your drug store.      Testing/Procedures To Be Arranged:  Lexiscan Myoview  Follow-Up:   See Dr. Marlou Porch back in early June for further discussion.     Other Special Instructions:   N/A    If you need a refill on your cardiac medications before your next appointment, please call your pharmacy.   Call the [place] office at [phone] if you have any questions, problems or concerns.

## 2016-12-14 NOTE — Progress Notes (Signed)
CARDIOLOGY OFFICE NOTE  Date:  12/14/2016    Bethany Grant Date of Birth: Sep 20, 1940 Medical Record #[id number]  PCP:  Celene Squibb, MD  Cardiologist:  Marisa Cyphers  Chief Complaint  Patient presents with  . Shortness of Breath    Work in visit - seen for Dr. Marlou Porch    History of Present Illness: Bethany Grant is a 76 y.o. female who presents today for a work in visit. Seen for Dr. Marlou Porch.   She has a history of RBBB with LAFB as well as a history of hyperlipidemia, hypertension and rheumatoid arthritis.She was referred here last year for abnormal EKG - no early FH for CAD, remote smoker. She did endorse "fatigue" at that visit.  Low risk Myoview from 2016 noted. No syncope.   Comes in today. Here alone today. Called last week with complaints of dyspnea. Thus added to today's schedule. She notes she has not been feeling well for the past several weeks due to fatigue. It comes and goes - not getting any worse but not getting better. Has to sit - then just falls asleep. Has lots of congestion in her head - this seems to make her tired. She tells me that she is not really short of breath. Has stopped walking due to her RA acting up over the past several months. She says she has had no "chest pain" but does endorse a "heavy feeling" with activity. This will last for just a few minutes. She is not dizzy. She has not had syncope. No history of CAD in her family - more cancer related deaths. Recent labs from PCP noted - LDL is 110.   Past Medical History:  Diagnosis Date  . DUB (dysfunctional uterine bleeding)   . Endometriosis   . Fibroid   . Hypercholesterolemia   . Hypertension   . Rheumatoid arthritis(714.0)     Past Surgical History:  Procedure Laterality Date  . ABDOMINAL HYSTERECTOMY  1991   TAH/BSO  . BARTHOLIN GLAND CYST EXCISION  1980   RIGHT  . BREAST SURGERY  1988   BREAST CYST  . COLONOSCOPY    . COLONOSCOPY N/A 08/24/2013   Procedure: COLONOSCOPY;   Surgeon: Rogene Houston, MD;  [facility];  Service: Endoscopy;  Laterality: N/A;  100  . FOOT SURGERY     2 TIMES  . FOOT SURGERY Left   . HAND SURGERY    . HEMORRHOID SURGERY    . OLECRANON BURSECTOMY Right 11/29/2013   Procedure: EXCISION OLECRANON BURSA RIGHT ;  Surgeon: Wynonia Sours, MD;  Location: [facility];  Service: Orthopedics;  Laterality: Right;  . OOPHORECTOMY     BSO  . TENOSYNOVECTOMY Right 11/29/2013   Procedure: TENOSYNOVECTOMY FLEXOR TENDON RIGHT WRIST ;  Surgeon: Wynonia Sours, MD;  Location: [facility];  Service: Orthopedics;  Laterality: Right;     Medications: Current Outpatient Prescriptions  Medication Sig Dispense Refill  . ALPRAZolam (XANAX) 0.25 MG tablet Take 0.25 mg by mouth at bedtime as needed for sleep.    Marland Kitchen aspirin 81 MG tablet Take 81 mg by mouth daily.    . cycloSPORINE (RESTASIS) 0.05 % ophthalmic emulsion Place 1 drop into both eyes 2 (two) times daily.     . folic acid (FOLVITE) 1 MG tablet Take 1 mg by mouth daily.    . hydroxychloroquine (PLAQUENIL) 200 MG tablet Take 400 mg by mouth daily.     Marland Kitchen  losartan-hydrochlorothiazide (HYZAAR) 100-12.5 MG per tablet Take 1 tablet by mouth daily.    . methotrexate (RHEUMATREX) 2.5 MG tablet Take 3 tablet on Fri and 2 tablets on Sat    . VITAMIN D, CHOLECALCIFEROL, PO Take 2,000 Int'l Units by mouth daily.     . nitroGLYCERIN (NITROSTAT) 0.4 MG SL tablet Place 1 tablet (0.4 mg total) under the tongue every 5 (five) minutes as needed for chest pain. 25 tablet 3   No current facility-administered medications for this visit.     Allergies: Allergies  Allergen Reactions  . Other Nausea And Vomiting    Doesn't do well with pain  Medications, sensitivity to tape    Social History: The patient  reports that she quit smoking about 12 years ago. Her smoking use included Cigarettes. She has a 24.00 pack-year smoking history. She has never used smokeless tobacco. She  reports that she does not drink alcohol or use drugs.   Family History: The patient's family history includes Cancer in her brother, mother, sister, and sister; Hypertension in her sister.   Review of Systems: Please see the history of present illness.   Otherwise, the review of systems is positive for none.   All other systems are reviewed and negative.   Physical Exam: VS:  BP 116/70 (BP Location: Left Arm, Patient Position: Sitting, Cuff Size: Normal)   Pulse 76   Ht 5\' 7"  (1.702 m)   Wt 167 lb 12.8 oz (76.1 kg)   SpO2 93% Comment: to 99 than 91 walking  BMI 26.28 kg/m  .  BMI Body mass index is 26.28 kg/m.  Wt Readings from Last 3 Encounters:  12/14/16 167 lb 12.8 oz (76.1 kg)  07/06/16 166 lb (75.3 kg)  05/20/16 166 lb (75.3 kg)    General: Pleasant. She seems fatigued - affect a little flat in my opinion but alert and in no acute distress.   HEENT: Normal.  Neck: Supple, no JVD, carotid bruits, or masses noted.  Cardiac: Regular rate and rhythm. No murmurs, rubs, or gallops. No edema.  Respiratory:  Lungs are clear to auscultation bilaterally with normal work of breathing.  GI: Soft and nontender.  MS: No deformity or atrophy. Gait and ROM intact.  Skin: Warm and dry. Color is normal.  Neuro:  Strength and sensation are intact and no gross focal deficits noted.  Psych: Alert, appropriate and with normal affect.   LABORATORY DATA:  EKG:  EKG is ordered today. This demonstrates NSR with bifascicular block and PACs noted today - unchanged from prior tracing.  Lab Results  Component Value Date   WBC 7.2 02/16/2013   HGB 12.7 02/16/2013   HCT 39.9 02/16/2013   PLT 167 02/16/2013   GLUCOSE 87 11/28/2013   NA 136 (L) 11/28/2013   K 4.5 11/28/2013   CL 98 11/28/2013   CREATININE 1.02 11/28/2013   BUN 14 11/28/2013   CO2 27 11/28/2013   INR 0.98 02/16/2013    BNP (last 3 results) No results for input(s): BNP in the last 8760 hours.  ProBNP (last 3 results) No  results for input(s): PROBNP in the last 8760 hours.   Other Studies Reviewed Today:  Notes Recorded by Jerline Pain, MD on 01/10/2015 at 10:22 AM Small, mild inferoseptal, fixed defect. Suspect bundle branch related defect. This is a low risk study. Overall left ventricular systolic function was normal - LVEF 73%. LV cavity size is normal. Overall reassuring study. Candee Furbish, MD  Assessment/Plan:  1.  Fatigue/chest heaviness/head congestion - typical and atypical features - EKG unchanged. Will check TSH - recent other labs noted - arrange for [facility] - further disposition to follow - also sent in RX for NTG today with full instruction in how to use. TSH today as well - her other labs from last month are noted. Will get her back to see Dr. Marlou Porch for discussion.    2. Dyspnea - not endorsed to me today.   3. Abnormal EKG - no syncope - not on AV conduction slowing agents.   4. RA   Current medicines are reviewed with the patient today.  The patient does not have concerns regarding medicines other than what has been noted above.  The following changes have been made:  See above.  Labs/ tests ordered today include:    Orders Placed This Encounter  Procedures  . TSH  . Myocardial Perfusion Imaging  . EKG 12-Lead     Disposition:   FU with Dr. Marlou Porch following Myoview study.   Patient is agreeable to this plan and will call if any problems develop in the interim.   SignedTruitt Merle, NP  12/14/2016 11:57 AM  [facility] [address] [place], [place]  [address] Phone: [phone] Fax: [phone]

## 2016-12-15 ENCOUNTER — Telehealth: Payer: Self-pay | Admitting: Nurse Practitioner

## 2016-12-15 NOTE — Telephone Encounter (Signed)
Patient states that she is returning call , thanks. °

## 2016-12-15 NOTE — Telephone Encounter (Signed)
-----   Message from Burtis Junes, NP sent at 12/14/2016  4:49 PM EDT ----- Ok to report. Her TSH is normal. Will see how her stress test turns out

## 2016-12-15 NOTE — Telephone Encounter (Signed)
Informed patient of results and verbal understanding expressed.  

## 2016-12-16 ENCOUNTER — Ambulatory Visit (HOSPITAL_COMMUNITY): Payer: Medicare HMO | Attending: Cardiovascular Disease

## 2016-12-16 DIAGNOSIS — I4510 Unspecified right bundle-branch block: Secondary | ICD-10-CM | POA: Diagnosis not present

## 2016-12-16 DIAGNOSIS — R079 Chest pain, unspecified: Secondary | ICD-10-CM | POA: Diagnosis not present

## 2016-12-16 DIAGNOSIS — I2510 Atherosclerotic heart disease of native coronary artery without angina pectoris: Secondary | ICD-10-CM | POA: Diagnosis present

## 2016-12-16 DIAGNOSIS — R0602 Shortness of breath: Secondary | ICD-10-CM | POA: Diagnosis not present

## 2016-12-16 DIAGNOSIS — R5383 Other fatigue: Secondary | ICD-10-CM | POA: Diagnosis not present

## 2016-12-16 DIAGNOSIS — I10 Essential (primary) hypertension: Secondary | ICD-10-CM | POA: Insufficient documentation

## 2016-12-16 LAB — MYOCARDIAL PERFUSION IMAGING
LV dias vol: 73 mL (ref 46–106)
LV sys vol: 34 mL
Peak HR: 82 {beats}/min
RATE: 0.29
Rest HR: 52 {beats}/min
SDS: 0
SRS: 2
SSS: 2
TID: 1

## 2016-12-16 MED ORDER — TECHNETIUM TC 99M TETROFOSMIN IV KIT
10.6000 | PACK | Freq: Once | INTRAVENOUS | Status: AC | PRN
Start: 2016-12-16 — End: 2016-12-16
  Administered 2016-12-16: 10.6 via INTRAVENOUS
  Filled 2016-12-16: qty 11

## 2016-12-16 MED ORDER — TECHNETIUM TC 99M TETROFOSMIN IV KIT
32.9000 | PACK | Freq: Once | INTRAVENOUS | Status: AC | PRN
  Administered 2016-12-16: 32.9 via INTRAVENOUS
  Filled 2016-12-16: qty 33

## 2016-12-16 MED ORDER — REGADENOSON 0.4 MG/5ML IV SOLN
0.4000 mg | Freq: Once | INTRAVENOUS | Status: AC
  Administered 2016-12-16: 0.4 mg via INTRAVENOUS

## 2016-12-30 ENCOUNTER — Ambulatory Visit (INDEPENDENT_AMBULATORY_CARE_PROVIDER_SITE_OTHER): Payer: Medicare HMO | Admitting: Cardiology

## 2016-12-30 ENCOUNTER — Encounter: Payer: Self-pay | Admitting: Cardiology

## 2016-12-30 VITALS — BP 134/70 | HR 62 | Ht 67.0 in | Wt 167.6 lb

## 2016-12-30 DIAGNOSIS — I444 Left anterior fascicular block: Secondary | ICD-10-CM

## 2016-12-30 DIAGNOSIS — I10 Essential (primary) hypertension: Secondary | ICD-10-CM | POA: Diagnosis not present

## 2016-12-30 DIAGNOSIS — I4510 Unspecified right bundle-branch block: Secondary | ICD-10-CM | POA: Diagnosis not present

## 2016-12-30 DIAGNOSIS — R5383 Other fatigue: Secondary | ICD-10-CM | POA: Diagnosis not present

## 2016-12-30 NOTE — Patient Instructions (Signed)

## 2016-12-30 NOTE — Progress Notes (Signed)
[facility]. [address] [place], [place]  [address] Phone: [phone] Fax:  [phone]  Date:  12/30/2016   ID:  Bethany Grant, DOB 1940-11-24, MRN [id number]  PCP:  Celene Squibb, MD   History of Present Illness: Bethany Grant is a 76 y.o. female here for follow up of stress test secondary to dyspnea.  Currently with no symptoms. Has a history of hyperlipidemia, hypertension and rheumatoid arthritis.  EKG demonstrated RBBB, LAFB, bifascicular block. PACs noted. Asymptomatic.  She has no early family history of CAD. She used to smoke in the distant past. She does have rheumatoid arthritis. Previous carpal tunnel surgery, cyst removal.  She exercises. Enjoys gardening. No syncope.  Her husband had stent placement after cardiac catheterization. He was having similar symptoms of fatigue. She is concerned because she feels tired. Could be anemia, could be methotrexate could be rheumatoid arthritis.  Nuclear stress test in 2016 was reassuring, low risk.  Once again nuclear stress test in 2018 was also low risk. This was ordered because of some dyspnea on exertion and some mild chest heaviness that she felt. Perhaps it was allergies.     Wt Readings from Last 3 Encounters:  12/30/16 167 lb 9.6 oz (76 kg)  12/16/16 167 lb (75.8 kg)  12/14/16 167 lb 12.8 oz (76.1 kg)     Past Medical History:  Diagnosis Date  . DUB (dysfunctional uterine bleeding)   . Endometriosis   . Fibroid   . Hypercholesterolemia   . Hypertension   . Rheumatoid arthritis(714.0)     Past Surgical History:  Procedure Laterality Date  . ABDOMINAL HYSTERECTOMY  1991   TAH/BSO  . BARTHOLIN GLAND CYST EXCISION  1980   RIGHT  . BREAST SURGERY  1988   BREAST CYST  . COLONOSCOPY    . COLONOSCOPY N/A 08/24/2013   Procedure: COLONOSCOPY;  Surgeon: Rogene Houston, MD;  [facility];  Service: Endoscopy;  Laterality: N/A;  100  . FOOT SURGERY     2 TIMES  . FOOT SURGERY Left   .  HAND SURGERY    . HEMORRHOID SURGERY    . OLECRANON BURSECTOMY Right 11/29/2013   Procedure: EXCISION OLECRANON BURSA RIGHT ;  Surgeon: Wynonia Sours, MD;  Location: [facility];  Service: Orthopedics;  Laterality: Right;  . OOPHORECTOMY     BSO  . TENOSYNOVECTOMY Right 11/29/2013   Procedure: TENOSYNOVECTOMY FLEXOR TENDON RIGHT WRIST ;  Surgeon: Wynonia Sours, MD;  Location: [facility];  Service: Orthopedics;  Laterality: Right;    Current Outpatient Prescriptions  Medication Sig Dispense Refill  . ALPRAZolam (XANAX) 0.25 MG tablet Take 0.25 mg by mouth at bedtime as needed for sleep.    Marland Kitchen aspirin 81 MG tablet Take 81 mg by mouth daily.    . cycloSPORINE (RESTASIS) 0.05 % ophthalmic emulsion Place 1 drop into both eyes 2 (two) times daily.     . folic acid (FOLVITE) 1 MG tablet Take 1 mg by mouth daily.    . hydroxychloroquine (PLAQUENIL) 200 MG tablet Take 400 mg by mouth daily.     Marland Kitchen losartan-hydrochlorothiazide (HYZAAR) 100-12.5 MG per tablet Take 1 tablet by mouth daily.    . methotrexate (RHEUMATREX) 2.5 MG tablet Take 3 tablet on Fri and 2 tablets on Sat    . nitroGLYCERIN (NITROSTAT) 0.4 MG SL tablet Place 1 tablet (0.4 mg total) under the tongue every 5 (five) minutes  as needed for chest pain. 25 tablet 3  . VITAMIN D, CHOLECALCIFEROL, PO Take 2,000 Int'l Units by mouth daily.      No current facility-administered medications for this visit.     Allergies:    Allergies  Allergen Reactions  . Other Nausea And Vomiting    Doesn't do well with pain  Medications, sensitivity to tape    Social History:  The patient  reports that she quit smoking about 12 years ago. Her smoking use included Cigarettes. She has a 24.00 pack-year smoking history. She has never used smokeless tobacco. She reports that she does not drink alcohol or use drugs.   Family History  Problem Relation Age of Onset  . Cancer Mother        LUNG  . Hypertension Sister   . Cancer  Sister        LUNG, THYROID, UTERINE  . Cancer Brother        LUNG  . Cancer Sister        Lung cancer  . Colon cancer Neg Hx     ROS:  Please see the history of present illness.   No fevers, chills, orthopnea, PND.  All other systems reviewed and negative.   PHYSICAL EXAM: VS:  BP 134/70   Pulse 62   Ht 5\' 7"  (1.702 m)   Wt 167 lb 9.6 oz (76 kg)   LMP  (LMP Unknown)   BMI 26.25 kg/m  GEN: Well nourished, well developed, in no acute distress  HEENT: normal  Neck: no JVD, carotid bruits, or masses Cardiac: RRR; no murmurs, rubs, or gallops,no edema  Respiratory:  clear to auscultation bilaterally, normal work of breathing GI: soft, nontender, nondistended, + BS MS: no deformity or atrophy  Skin: warm and dry, no rash Neuro:  Alert and Oriented x 3, Strength and sensation are intact Psych: euthymic mood, full affect   EKG:  Today, 02/24/16-sinus bradycardia 58, left anterior fascicular block, right bundle branch block-bifascicular block personally viewed-no significant change from prior 12/20/14-sinus bradycardia rate 57) a branch block left anterior fascicular block bifascicular block-previous EKG 11/28/13-sinus rhythm, PAC, right bundle branch block, left anterior fascicular block, bifascicular block  Labs: Creatinine 1.07, potassium 4.3, LDL 99, HDL 46, TSH 2.5, CK 33   Prior medical records reviewed  NUC stress 12/16/16  Nuclear stress EF: 54%.  There was no ST segment deviation noted during stress.  This is a low risk study.  The left ventricular ejection fraction is normal (55-65%).  Defect 1: There is a small defect of mild severity present in the basal inferoseptal and mid inferoseptal location. This is a fixed defect likely attributable to RBBB. There is no ischmia.  ASSESSMENT AND PLAN:  Dyspnea/atypical chest pain  - Reassuring nuclear stress test as above.  - Discussed in clinic.  - This has resolved. She is walking daily without any difficulties. Still with  mild fatigue however. Could be because of her rheumatoid arthritis medications she thinks.  Right bundle branch block/left anterior fascicular block/bifascicular block  - Pacemaker could be a possibility in the future. Try to avoid AV nodal blocking agents.  Essential hypertension  - Currently well controlled. No changes.  One-year follow-up.  Signed, Candee Furbish, MD [facility]  12/30/2016 3:06 PM

## 2017-01-08 DIAGNOSIS — M0579 Rheumatoid arthritis with rheumatoid factor of multiple sites without organ or systems involvement: Secondary | ICD-10-CM | POA: Diagnosis not present

## 2017-01-08 DIAGNOSIS — M7989 Other specified soft tissue disorders: Secondary | ICD-10-CM | POA: Diagnosis not present

## 2017-01-08 DIAGNOSIS — E663 Overweight: Secondary | ICD-10-CM | POA: Diagnosis not present

## 2017-01-08 DIAGNOSIS — Z6826 Body mass index (BMI) 26.0-26.9, adult: Secondary | ICD-10-CM | POA: Diagnosis not present

## 2017-01-12 DIAGNOSIS — J018 Other acute sinusitis: Secondary | ICD-10-CM | POA: Diagnosis not present

## 2017-02-06 DIAGNOSIS — J0100 Acute maxillary sinusitis, unspecified: Secondary | ICD-10-CM | POA: Diagnosis not present

## 2017-02-18 ENCOUNTER — Telehealth: Payer: Self-pay | Admitting: Cardiology

## 2017-02-18 NOTE — Telephone Encounter (Signed)
Follow Up:    Pt said she saw Dr Marlou Porch in June and have appointment for August 2nd.She wants to know if she still needs the appointment in August?

## 2017-02-22 NOTE — Telephone Encounter (Signed)
Spoke with husband Jenny Reichmann) on [place] - aware pt does not need to be seen as scheduled and I will cancel that appt.  She will f/u in 1 year as instructed.

## 2017-02-22 NOTE — Telephone Encounter (Signed)
Patient Instructions Encounter Date: 12/30/2016 Shellia Cleverly, RN    Medication Instructions:  The current medical regimen is effective;  continue present plan and medications.  Follow-Up: Follow up in 1 year with Dr. Marlou Porch.  You will receive a letter in the mail 2 months before you are due.  Please call us when you receive this letter to schedule your follow up appointment.

## 2017-02-25 ENCOUNTER — Ambulatory Visit: Payer: Medicare HMO | Admitting: Cardiology

## 2017-03-04 DIAGNOSIS — R0981 Nasal congestion: Secondary | ICD-10-CM | POA: Diagnosis not present

## 2017-03-04 DIAGNOSIS — R430 Anosmia: Secondary | ICD-10-CM | POA: Diagnosis not present

## 2017-03-12 DIAGNOSIS — R430 Anosmia: Secondary | ICD-10-CM | POA: Diagnosis not present

## 2017-03-12 DIAGNOSIS — J3489 Other specified disorders of nose and nasal sinuses: Secondary | ICD-10-CM | POA: Diagnosis not present

## 2017-03-12 DIAGNOSIS — R0981 Nasal congestion: Secondary | ICD-10-CM | POA: Diagnosis not present

## 2017-04-12 DIAGNOSIS — E663 Overweight: Secondary | ICD-10-CM | POA: Diagnosis not present

## 2017-04-12 DIAGNOSIS — M0579 Rheumatoid arthritis with rheumatoid factor of multiple sites without organ or systems involvement: Secondary | ICD-10-CM | POA: Diagnosis not present

## 2017-04-12 DIAGNOSIS — Z6826 Body mass index (BMI) 26.0-26.9, adult: Secondary | ICD-10-CM | POA: Diagnosis not present

## 2017-04-21 ENCOUNTER — Other Ambulatory Visit (HOSPITAL_COMMUNITY): Payer: Self-pay | Admitting: Internal Medicine

## 2017-04-21 DIAGNOSIS — Z1231 Encounter for screening mammogram for malignant neoplasm of breast: Secondary | ICD-10-CM

## 2017-05-13 ENCOUNTER — Ambulatory Visit (INDEPENDENT_AMBULATORY_CARE_PROVIDER_SITE_OTHER)
Admission: RE | Admit: 2017-05-13 | Discharge: 2017-05-13 | Disposition: A | Discharge status: Home / Self Care | Payer: Medicare HMO | Source: Ambulatory Visit | Attending: Pulmonary Disease | Admitting: Pulmonary Disease

## 2017-05-13 ENCOUNTER — Ambulatory Visit (INDEPENDENT_AMBULATORY_CARE_PROVIDER_SITE_OTHER): Payer: Medicare HMO | Admitting: Pulmonary Disease

## 2017-05-13 ENCOUNTER — Encounter: Payer: Self-pay | Admitting: Pulmonary Disease

## 2017-05-13 VITALS — BP 102/62 | HR 51 | Ht 67.0 in | Wt 163.6 lb

## 2017-05-13 DIAGNOSIS — R001 Bradycardia, unspecified: Secondary | ICD-10-CM | POA: Diagnosis not present

## 2017-05-13 DIAGNOSIS — J849 Interstitial pulmonary disease, unspecified: Secondary | ICD-10-CM

## 2017-05-13 DIAGNOSIS — R918 Other nonspecific abnormal finding of lung field: Secondary | ICD-10-CM | POA: Diagnosis not present

## 2017-05-13 NOTE — Patient Instructions (Signed)
Follow up with Cardiology to assess low heart rate  Follow up in 1 year with chest xray

## 2017-05-13 NOTE — Progress Notes (Signed)
Current Outpatient Prescriptions on File Prior to Visit  Medication Sig  . ALPRAZolam (XANAX) 0.25 MG tablet Take 0.25 mg by mouth at bedtime as needed for sleep.  Marland Kitchen aspirin 81 MG tablet Take 81 mg by mouth daily.  . cycloSPORINE (RESTASIS) 0.05 % ophthalmic emulsion Place 1 drop into both eyes 2 (two) times daily.   . folic acid (FOLVITE) 1 MG tablet Take 1 mg by mouth daily.  . hydroxychloroquine (PLAQUENIL) 200 MG tablet Take 400 mg by mouth daily.   Marland Kitchen losartan-hydrochlorothiazide (HYZAAR) 100-12.5 MG per tablet Take 1 tablet by mouth daily.  . methotrexate (RHEUMATREX) 2.5 MG tablet Take 3 tablet on Fri and 2 tablets on Sat  . VITAMIN D, CHOLECALCIFEROL, PO Take 2,000 Int'l Units by mouth daily.   . nitroGLYCERIN (NITROSTAT) 0.4 MG SL tablet Place 1 tablet (0.4 mg total) under the tongue every 5 (five) minutes as needed for chest pain.   No current facility-administered medications on file prior to visit.      Chief Complaint  Patient presents with  . Follow-up    Pt had X-Ray prior to visit this morning. Pt is having nasal congestion issues on left side     Chest imaging CT chest 01/25/08 >> 4 mm Lt lower lobe nodule, hazy reticular infiltrate LLL CT chest 04/18/08 >> increase in LLL reticular infiltrate CT chest 05/23/09 >> 3.8 x 3.6 cm ascending aorta, scoliosis, decreased size LLL nodule, reticular changes LLL decreased  Pulmonary tests Bronchoscopy Oct 2009>>Pneumatocyte atypia suggestive of obstructive pneumonitis, can be seen with RA in the lungs PFT 06/19/08 >> FEV1 2.58(107%), FEV1% 71, TLC 6.07(111%), DLCO 96%, no BD  Past medical history Endometriosis, HTN, HLD, RA, RBBB  Past surgical history, Family history, Social history, Allergies all reviewed.  Vital Signs BP 102/62 (BP Location: Left Arm, Cuff Size: Normal)   Pulse (!) 51   Ht 5\' 7"  (1.702 m)   Wt 163 lb 9.6 oz (74.2 kg)   LMP  (LMP Unknown)   SpO2 98%   BMI 25.62 kg/m   History of Present  Illness Bethany Grant is a 76 y.o. female former smoker with lung nodules and family history of lung cancer.    She has noticed getting tired more easily.  She denies chest pain.  She is not having cough, wheeze, fever, or hemoptysis.  She has noticed her heart rate has been low.  It was in the 40's at doctor visit last month, and in 50's today.  She was recently seen by Dr. Marlou Porch with cardiology, and found to have RBBB.  At that time her heart rate was in 60's.  There was mention of whether she might need a pacemaker at some point.  She changed to Dr. Amil Amen with rheumatology for management of her RA.  She remains on MTX and plaquenil.  She had a sinus infection a few weeks ago.  She was on two rounds of Abx, and has improved since.  She was seen by Dr. Constance Holster with ENT.  Physical Exam  General - pleasant Eyes - pupils reactive ENT - no sinus tenderness, no oral exudate, no LAN, nasal septal deviation Cardiac - regular, no murmur Chest - no wheeze, rales Abd - soft, non tender Ext - changes of RA in her hands Skin - no rashes Neuro - normal strength Psych - normal mood    Dg Chest 2 View  Result Date: 05/13/2017 CLINICAL DATA:  Interstitial lung disease, routine follow-up, no complaints EXAM: CHEST  2 VIEW COMPARISON:  05/20/2016, 10/12/2013 FINDINGS: Mild bilateral interstitial thickening consistent with mild chronic interstitial lung disease unchanged compared with 05/20/2016. There is no focal parenchymal opacity. There is no pleural effusion or pneumothorax. The heart and mediastinal contours are unremarkable. The osseous structures are unremarkable. IMPRESSION: No active cardiopulmonary disease. Electronically Signed   By: Kathreen Devoid   On: 05/13/2017 09:46     ECG today showed HR 47 with RBBB and fascicular block   Assessment/Plan  History of tobacco abuse, and family history of lung cancer with prior imaging studies showing reticulonodular infiltrates in setting of  rheumatoid arthritis. - her chest imaging has been stable - don't think she needs CT chest or PFT at this time - she would like to continue annual follow up with chest xray  Fatigue with bradycardia. - asked her to follow up with Dr. Marlou Porch with cardiology  Rheumatoid arthritis.  - she remains on MTX and plaquenil and is followed by Dr. Amil Amen   Patient Instructions  Follow up with Cardiology to assess low heart rate  Follow up in 1 year with chest xray    Chesley Mires, MD [facility] Pager:  [phone] 05/13/2017, 10:42 AM

## 2017-05-17 DIAGNOSIS — I10 Essential (primary) hypertension: Secondary | ICD-10-CM | POA: Diagnosis not present

## 2017-05-17 DIAGNOSIS — D509 Iron deficiency anemia, unspecified: Secondary | ICD-10-CM | POA: Diagnosis not present

## 2017-05-20 DIAGNOSIS — I4510 Unspecified right bundle-branch block: Secondary | ICD-10-CM | POA: Diagnosis not present

## 2017-05-20 DIAGNOSIS — R5383 Other fatigue: Secondary | ICD-10-CM | POA: Diagnosis not present

## 2017-05-20 DIAGNOSIS — Z23 Encounter for immunization: Secondary | ICD-10-CM | POA: Diagnosis not present

## 2017-05-20 DIAGNOSIS — D649 Anemia, unspecified: Secondary | ICD-10-CM | POA: Diagnosis not present

## 2017-05-20 DIAGNOSIS — R944 Abnormal results of kidney function studies: Secondary | ICD-10-CM | POA: Diagnosis not present

## 2017-05-20 DIAGNOSIS — M0600 Rheumatoid arthritis without rheumatoid factor, unspecified site: Secondary | ICD-10-CM | POA: Diagnosis not present

## 2017-05-20 DIAGNOSIS — D72819 Decreased white blood cell count, unspecified: Secondary | ICD-10-CM | POA: Diagnosis not present

## 2017-05-20 DIAGNOSIS — R911 Solitary pulmonary nodule: Secondary | ICD-10-CM | POA: Diagnosis not present

## 2017-05-20 DIAGNOSIS — I10 Essential (primary) hypertension: Secondary | ICD-10-CM | POA: Diagnosis not present

## 2017-05-20 DIAGNOSIS — I452 Bifascicular block: Secondary | ICD-10-CM | POA: Diagnosis not present

## 2017-05-24 ENCOUNTER — Encounter: Payer: Self-pay | Admitting: Physician Assistant

## 2017-06-03 ENCOUNTER — Ambulatory Visit: Payer: Medicare HMO | Admitting: Physician Assistant

## 2017-06-03 ENCOUNTER — Encounter: Payer: Self-pay | Admitting: Physician Assistant

## 2017-06-03 VITALS — BP 120/70 | HR 66 | Ht 67.0 in | Wt 161.0 lb

## 2017-06-03 DIAGNOSIS — K602 Anal fissure, unspecified: Secondary | ICD-10-CM | POA: Diagnosis not present

## 2017-06-03 DIAGNOSIS — K648 Other hemorrhoids: Secondary | ICD-10-CM

## 2017-06-03 DIAGNOSIS — L290 Pruritus ani: Secondary | ICD-10-CM

## 2017-06-03 MED ORDER — AMBULATORY NON FORMULARY MEDICATION: 0.1250 mg | Freq: Three times a day (TID) | 3 refills | Status: DC

## 2017-06-03 MED ORDER — LIDOCAINE HCL 2 % EX GEL: 1.0000 "application " | CUTANEOUS | 1 refills | Status: DC | PRN

## 2017-06-03 NOTE — Progress Notes (Signed)
Reviewed and agree with initial management plan.  Chananya Canizalez T. Jodine Muchmore, MD FACG 

## 2017-06-03 NOTE — Progress Notes (Signed)
Chief Complaint: Rectal pain and itching  HPI:    Bethany Grant is a 76 year old Caucasian female with a past medical history as listed below, who was referred to me by Celene Squibb, MD for a complaint of rectal pain and itching.      Review of outside records patient shows patient was seen by Dr. Laural Golden previously.  Her last colonoscopy was 08/24/13 and was normal except a single diverticulum at the sigmoid colon.  At that time, patient was given Mycolog cream to be applied to the perianal area twice a day for 2 weeks and when necessary for pruritus ani.    Today, the patient describes that she has had itching in her rectum for a long time, at least 5 or more years.  She has been using various creams for this over the years.  She has seen a gastroenterologist as well as a dermatologist.  Currently, she is using Clobetasol 0.05% cream twice a day for this.  Patient tells me that it never fully goes away and cream only "works for so long".  Recently, the patient has noticed an increase in rectal pain and "hurting".  She describes this as an intense "burning/itching".  This sometimes wakes her up at night.  This seems to get worse with gas and a bowel movement.  Patient does not describe it being particularly worse when she is actually passing a stool.  Patient has seen some intermittent bright red blood on the toilet paper, but this is typically after "scratching back there a lot".  Patient describes regular bowel movements at least 1-2 times a day which are soft and formed.  "This is better than it has ever been".    Patient's surgical history is positive for what sounds like a hemorrhoidectomy "100 years ago".    Patient denies fever, chills, change in bowel habits, weight loss, anorexia, nausea, vomiting, heartburn or reflux.    Past Medical History:  Diagnosis Date  . DUB (dysfunctional uterine bleeding)   . Endometriosis   . Fibroid   . Hypercholesterolemia   . Hypertension   . Rheumatoid  arthritis(714.0)     Past Surgical History:  Procedure Laterality Date  . ABDOMINAL HYSTERECTOMY  1991   TAH/BSO  . BARTHOLIN GLAND CYST EXCISION  1980   RIGHT  . BREAST SURGERY  1988   BREAST CYST  . COLONOSCOPY    . FOOT SURGERY     2 TIMES  . FOOT SURGERY Left   . HAND SURGERY    . HEMORRHOID SURGERY    . OOPHORECTOMY     BSO    Current Outpatient Medications  Medication Sig Dispense Refill  . ALPRAZolam (XANAX) 0.25 MG tablet Take 0.25 mg by mouth at bedtime as needed for sleep.    Marland Kitchen aspirin 81 MG tablet Take 81 mg by mouth daily.    . cycloSPORINE (RESTASIS) 0.05 % ophthalmic emulsion Place 1 drop into both eyes 2 (two) times daily.     . folic acid (FOLVITE) 1 MG tablet Take 1 mg by mouth daily.    . hydroxychloroquine (PLAQUENIL) 200 MG tablet Take 400 mg by mouth daily.     Marland Kitchen losartan-hydrochlorothiazide (HYZAAR) 100-12.5 MG per tablet Take 1 tablet by mouth daily.    . methotrexate (RHEUMATREX) 2.5 MG tablet Take 3 tablet on Fri and 2 tablets on Sat    . AMBULATORY NON FORMULARY MEDICATION Place 0.125 mg 3 (three) times daily rectally. Medication Name: Nitroglycerin ointment 0.125 mg  30 g 3  . lidocaine (XYLOCAINE) 2 % jelly Apply 1 application as needed topically. 30 mL 1  . nitroGLYCERIN (NITROSTAT) 0.4 MG SL tablet Place 1 tablet (0.4 mg total) under the tongue every 5 (five) minutes as needed for chest pain. 25 tablet 3   No current facility-administered medications for this visit.     Allergies as of 06/03/2017 - Review Complete 05/13/2017  Allergen Reaction Noted  . Other Nausea And Vomiting 02/10/2013    Family History  Problem Relation Age of Onset  . Cancer Mother        LUNG  . Hypertension Sister   . Cancer Sister        LUNG, THYROID, UTERINE  . Cancer Brother        LUNG  . Cancer Sister        Lung cancer  . Colon cancer Neg Hx     Social History   Socioeconomic History  . Marital status: Married    Spouse name: Not on file  . Number  of children: Not on file  . Years of education: Not on file  . Highest education level: Not on file  Social Needs  . Financial resource strain: Not on file  . Food insecurity - worry: Not on file  . Food insecurity - inability: Not on file  . Transportation needs - medical: Not on file  . Transportation needs - non-medical: Not on file  Occupational History  . Not on file  Tobacco Use  . Smoking status: Former Smoker    Packs/day: 0.80    Years: 30.00    Pack years: 24.00    Types: Cigarettes    Last attempt to quit: 07/27/2004    Years since quitting: 12.8  . Smokeless tobacco: Never Used  Substance and Sexual Activity  . Alcohol use: No    Alcohol/week: 0.0 oz  . Drug use: No  . Sexual activity: Yes    Birth control/protection: Surgical    Comment: Rare  Other Topics Concern  . Not on file  Social History Narrative  . Not on file    Review of Systems:    Constitutional: No weight loss, fever or chills Skin: No rash  Cardiovascular: No chest pain Respiratory: No SOB  Gastrointestinal: See HPI and otherwise negative Genitourinary: No dysuria  Neurological: No headache Musculoskeletal: No new muscle or joint pain Hematologic: No bruising Psychiatric: No history of depression or anxiety    Physical Exam:  Vital signs: BP 120/70   Pulse 66   Ht 5\' 7"  (1.702 m)   Wt 161 lb (73 kg)   LMP  (LMP Unknown)   SpO2 98%   BMI 25.22 kg/m   Constitutional:   Pleasant Caucasian female appears to be in NAD, Well developed, Well nourished, alert and cooperative Head:  Normocephalic and atraumatic. Eyes:   PEERL, EOMI. No icterus. Conjunctiva pink. Ears:  Normal auditory acuity. Neck:  Supple Throat: Oral cavity and pharynx without inflammation, swelling or lesion.  Respiratory: Respirations even and unlabored. Lungs clear to auscultation bilaterally.   No wheezes, crackles, or rhonchi.  Cardiovascular: Normal S1, S2. No MRG. Regular rate and rhythm. No peripheral edema,  cyanosis or pallor.  Gastrointestinal:  Soft, nondistended, nontender. No rebound or guarding. Normal bowel sounds. No appreciable masses or hepatomegaly. Rectal:  External exam: thickened rectal tissue, multiple hemorrhoids tags, posterior fissure; Anoscopy: grade II internal hemorrhoids, no residue, no ttp  Msk:  Symmetrical without gross deformities. Without edema, no  deformity or joint abnormality.  Neurologic:  Alert and  oriented x4;  grossly normal neurologically.  Skin:   Dry and intact without significant lesions or rashes. Psychiatric:  Demonstrates good judgement and reason without abnormal affect or behaviors.  No recent labs or imaging.  Assessment: 1. Anal Fissure: Seen at time of exam today, likely source of intermittent bleeding as well as increasing pain and itching 2. Internal hemorrhoids 3. Pruritus Ani: Chronic, currently on Clobetasol cream twice daily which "helps some"  Plan: 1.  Continue Clobetasol cream as prescribed 2.  Prescribed Nitroglycerin ointment 0.125 mg 3 times daily times 1 month with a refill 3.  Encouraged patient to do sitz baths 2-3 times a day for 15-20 minutes 4.  Recommend the patient try and keep her rectal area as dry as possible.  Instructed her that she could try putting a cotton ball in her rectum to help with going moisture. 5.  Patient to follow in clinic with me in 4-6 weeks or sooner if necessary.  She was assigned to Dr. Fuller Plan today.  Ellouise Newer, PA-C [facility] 06/03/2017, 10:09 AM  Cc: Celene Squibb, MD

## 2017-06-03 NOTE — Patient Instructions (Addendum)
We have sent a prescription for nitroglycerin 0.125% gel to [facility]. You should apply a pea size amount to your rectum three times daily x 1 month.  [facility]'s information is below: Address: [place],  [address]  [phone]  Sitz baths 2-3 times a day.   Conitnue Mycalog  We have sent the following medications to your pharmacy for you to pick up at your convenience: Lidocaine gel every 6 hours as needed.    How to Take a Sitz Bath A sitz bath is a warm water bath that is taken while you are sitting down. The water should only come up to your hips and should cover your buttocks. Your health care provider may recommend a sitz bath to help you:  Clean the lower part of your body, including your genital area.  With itching.  With pain.  With sore muscles or muscles that tighten or spasm.  How to take a sitz bath Take 3-4 sitz baths per day or as told by your health care provider. 1. Partially fill a bathtub with warm water. You will only need the water to be deep enough to cover your hips and buttocks when you are sitting in it. 2. If your health care provider told you to put medicine in the water, follow the directions exactly. 3. Sit in the water and open the tub drain a little. 4. Turn on the warm water again to keep the tub at the correct level. Keep the water running constantly. 5. Soak in the water for 15-20 minutes or as told by your health care provider. 6. After the sitz bath, pat the affected area dry first. Do not rub it. 7. Be careful when you stand up after the sitz bath because you may feel dizzy.  Contact a health care provider if:  Your symptoms get worse. Do not continue with sitz baths if your symptoms get worse.  You have new symptoms. Do not continue with sitz baths until you talk with your health care provider. This information is not intended to replace advice given to you by your health care provider.  Make sure you discuss any questions you have with your health care provider. Document Released: 04/04/2004 Document Revised: 12/11/2015 Document Reviewed: 07/11/2014 Elsevier Interactive Patient Education  [employer].

## 2017-06-07 ENCOUNTER — Ambulatory Visit (HOSPITAL_COMMUNITY): Payer: Medicare HMO

## 2017-06-09 ENCOUNTER — Encounter (HOSPITAL_COMMUNITY): Payer: Self-pay

## 2017-06-09 ENCOUNTER — Ambulatory Visit (HOSPITAL_COMMUNITY)
Admission: RE | Admit: 2017-06-09 | Discharge: 2017-06-09 | Disposition: A | Discharge status: Home / Self Care | Payer: Medicare HMO | Source: Ambulatory Visit | Attending: Internal Medicine | Admitting: Internal Medicine

## 2017-06-09 DIAGNOSIS — Z1231 Encounter for screening mammogram for malignant neoplasm of breast: Secondary | ICD-10-CM | POA: Insufficient documentation

## 2017-07-02 ENCOUNTER — Encounter: Payer: Self-pay | Admitting: Physician Assistant

## 2017-07-02 ENCOUNTER — Ambulatory Visit: Payer: Medicare HMO | Admitting: Physician Assistant

## 2017-07-02 VITALS — BP 118/66 | HR 56 | Ht 67.0 in | Wt 164.8 lb

## 2017-07-02 DIAGNOSIS — K602 Anal fissure, unspecified: Secondary | ICD-10-CM | POA: Diagnosis not present

## 2017-07-02 DIAGNOSIS — L290 Pruritus ani: Secondary | ICD-10-CM

## 2017-07-02 NOTE — Progress Notes (Signed)
Chief Complaint: Follow-up anal fissure and pruritus ani  HPI:    Bethany Grant is a 76 year old Caucasian female with a past medical history as listed below, who returns to clinic today for follow-up of her anal fissure and pruritus ani.      Please recall patient was last seen in clinic by me and typically follows with Dr. Fuller Plan, on 06/03/17 for a complaint of itching in her rectum for a long time at least 5 or more years.  She had tried various creams for this and had also seen a gastroenterologist as well as a dermatologist.  She was using clobetasol cream twice a day.  Patient also described some rectal pain and "burning".  At time of exam patient had an anal fissure as well as multiple hemorrhoid tags and thickened rectal tissue.  She was given Nitroglycerin ointment 3 times daily as well as encouraged to use sitz baths.  She was also encouraged to put a cotton ball by her rectum to wick away the moisture and help with itching.    Today, the patient returns to clinic and expresses her great relief that all of her symptoms are much much better.  The patient used her Nitroglycerin ointment for 2 weeks and has been doing sitz bath's.  She does tell me that occasionally the sitz baths seem like they are "too hot" and will make her rectum sensitive.  Patient describes the itching is better and she has not had to use her clobetasol cream at all.  Overall the patient is very happy that "someone figured it out".    Patient denies fever, chills, continued blood in her stool or change in bowel habits.  Past Medical History:  Diagnosis Date  . DUB (dysfunctional uterine bleeding)   . Endometriosis   . Fibroid   . Hypercholesterolemia   . Hypertension   . Rheumatoid arthritis(714.0)     Past Surgical History:  Procedure Laterality Date  . ABDOMINAL HYSTERECTOMY  1991   TAH/BSO  . BARTHOLIN GLAND CYST EXCISION  1980   RIGHT  . BREAST SURGERY  1988   BREAST CYST  . COLONOSCOPY    . COLONOSCOPY  N/A 08/24/2013   Procedure: COLONOSCOPY;  Surgeon: Rogene Houston, MD;  [facility]: [facility];  Service: Endoscopy;  Laterality: N/A;  100  . FOOT SURGERY     2 TIMES  . FOOT SURGERY Left   . HAND SURGERY    . HEMORRHOID SURGERY    . OLECRANON BURSECTOMY Right 11/29/2013   Procedure: EXCISION OLECRANON BURSA RIGHT ;  Surgeon: Wynonia Sours, MD;  Location: [facility];  Service: Orthopedics;  Laterality: Right;  . OOPHORECTOMY     BSO  . TENOSYNOVECTOMY Right 11/29/2013   Procedure: TENOSYNOVECTOMY FLEXOR TENDON RIGHT WRIST ;  Surgeon: Wynonia Sours, MD;  Location: [facility];  Service: Orthopedics;  Laterality: Right;    Current Outpatient Medications  Medication Sig Dispense Refill  . ALPRAZolam (XANAX) 0.25 MG tablet Take 0.25 mg by mouth at bedtime as needed for sleep.    . AMBULATORY NON FORMULARY MEDICATION Place 0.125 mg 3 (three) times daily rectally. Medication Name: Nitroglycerin ointment 0.125 mg 30 g 3  . aspirin 81 MG tablet Take 81 mg by mouth daily.    . cycloSPORINE (RESTASIS) 0.05 % ophthalmic emulsion Place 1 drop into both eyes 2 (two) times daily.     . folic acid (FOLVITE) 1 MG tablet Take 1 mg by mouth  daily.    . hydroxychloroquine (PLAQUENIL) 200 MG tablet Take 400 mg by mouth daily.     Marland Kitchen lidocaine (XYLOCAINE) 2 % jelly Apply 1 application as needed topically. 30 mL 1  . losartan-hydrochlorothiazide (HYZAAR) 100-12.5 MG per tablet Take 1 tablet by mouth daily.    . methotrexate (RHEUMATREX) 2.5 MG tablet Take 3 tablet on Fri and 2 tablets on Sat    . nitroGLYCERIN (NITROSTAT) 0.4 MG SL tablet Place 1 tablet (0.4 mg total) under the tongue every 5 (five) minutes as needed for chest pain. 25 tablet 3   No current facility-administered medications for this visit.     Allergies as of 07/02/2017 - Review Complete 07/02/2017  Allergen Reaction Noted  . Other Nausea And Vomiting 02/10/2013    Family History  Problem Relation Age of Onset    . Cancer Mother        LUNG  . Hypertension Sister   . Cancer Sister        LUNG, THYROID, UTERINE  . Cancer Brother        LUNG  . Cancer Sister        Lung cancer  . Colon cancer Neg Hx     Social History   Socioeconomic History  . Marital status: Married    Spouse name: Not on file  . Number of children: Not on file  . Years of education: Not on file  . Highest education level: Not on file  Social Needs  . Financial resource strain: Not on file  . Food insecurity - worry: Not on file  . Food insecurity - inability: Not on file  . Transportation needs - medical: Not on file  . Transportation needs - non-medical: Not on file  Occupational History  . Not on file  Tobacco Use  . Smoking status: Former Smoker    Packs/day: 0.80    Years: 30.00    Pack years: 24.00    Types: Cigarettes    Last attempt to quit: 07/27/2004    Years since quitting: 12.9  . Smokeless tobacco: Never Used  Substance and Sexual Activity  . Alcohol use: No    Alcohol/week: 0.0 oz  . Drug use: No  . Sexual activity: Yes    Birth control/protection: Surgical    Comment: Rare  Other Topics Concern  . Not on file  Social History Narrative  . Not on file    Review of Systems:    Constitutional: No weight loss, fever or chills Cardiovascular: No chest pain Respiratory: No SOB  Gastrointestinal: See HPI and otherwise negative    Physical Exam:  Vital signs: BP 118/66   Pulse (!) 56   Ht 5\' 7"  (1.702 m)   Wt 164 lb 12.8 oz (74.8 kg)   LMP  (LMP Unknown)   BMI 25.81 kg/m   Constitutional:   Pleasant Elderly Caucasian female appears to be in NAD, Well developed, Well nourished, alert and cooperative Respiratory: Respirations even and unlabored. Lungs clear to auscultation bilaterally.   No wheezes, crackles, or rhonchi.  Cardiovascular: Normal S1, S2. No MRG. Regular rate and rhythm. No peripheral edema, cyanosis or pallor.  Gastrointestinal:  Soft, nondistended, nontender. No rebound  or guarding. Normal bowel sounds. No appreciable masses or hepatomegaly. Rectal:  External exam: multiple hemorrhoid tags, healed anal fissure, no ttp Psychiatric: Demonstrates good judgement and reason without abnormal affect or behaviors.  RELEVANT LABS AND IMAGING: CBC    Component Value Date/Time   WBC 7.2 02/16/2013  0900   RBC 4.50 02/16/2013 0900   HGB 12.7 02/16/2013 0900   HCT 39.9 02/16/2013 0900   PLT 167 02/16/2013 0900   MCV 88.7 02/16/2013 0900   MCH 28.2 02/16/2013 0900   MCHC 31.8 02/16/2013 0900   RDW 12.7 02/16/2013 0900    CMP     Component Value Date/Time   NA 136 (L) 11/28/2013 1430   K 4.5 11/28/2013 1430   CL 98 11/28/2013 1430   CO2 27 11/28/2013 1430   GLUCOSE 87 11/28/2013 1430   BUN 14 11/28/2013 1430   CREATININE 1.02 11/28/2013 1430   CALCIUM 8.1 (L) 07/01/2015 1050   GFRNONAA 54 (L) 11/28/2013 1430   GFRAA 63 (L) 11/28/2013 1430    Assessment: 1.  Pruritus ani: Improved after treatment for fissure below and cottonball placement as well as sitz baths 2.  Anal fissure: Healed after 2 weeks of nitroglycerin ointment 3 times daily and sitz baths  Plan: 1.  Discussed with patient that there is not a lot to do about the external hemorrhoid tags, would recommend that she try and keep these clean and dry, otherwise they can contribute to itching. 2.  Patient to continue her Nitroglycerin ointment in the future if she has return of burning pain, did discuss that if she calls our clinic within the next year and needs a refill of this cream we can do that for her. 3.  Patient will continue her cottonball placement and sitz baths for pruritus ani 4. Discussed that patient should make her sitz baths a little cooler if they seem as though they are making her rectum more sensitive. 5.  Patient to return to clinic with me or Dr. Fuller Plan as needed in the future.  Ellouise Newer, PA-C [facility] 07/02/2017, 9:29 AM  Cc: Bethany Squibb, MD

## 2017-07-06 DIAGNOSIS — R300 Dysuria: Secondary | ICD-10-CM | POA: Diagnosis not present

## 2017-07-06 DIAGNOSIS — N390 Urinary tract infection, site not specified: Secondary | ICD-10-CM | POA: Diagnosis not present

## 2017-07-07 NOTE — Progress Notes (Signed)
Reviewed and agree with management plan.  Kailei Cowens T. Rheda Kassab, MD FACG 

## 2017-07-14 DIAGNOSIS — M0579 Rheumatoid arthritis with rheumatoid factor of multiple sites without organ or systems involvement: Secondary | ICD-10-CM | POA: Diagnosis not present

## 2017-07-14 DIAGNOSIS — E663 Overweight: Secondary | ICD-10-CM | POA: Diagnosis not present

## 2017-07-14 DIAGNOSIS — Z6825 Body mass index (BMI) 25.0-25.9, adult: Secondary | ICD-10-CM | POA: Diagnosis not present

## 2017-08-23 ENCOUNTER — Telehealth: Payer: Self-pay | Admitting: Physician Assistant

## 2017-08-23 NOTE — Telephone Encounter (Signed)
The pt has been using the nitroglycerin cream and clobetasol as directed.  She has also been doing sitz baths and using cotton balls on her rectum to wick moisture away.  She has been given a follow up with Anderson Malta on 2/6.     Plan: 1.  Discussed with patient that there is not a lot to do about the external hemorrhoid tags, would recommend that she try and keep these clean and dry, otherwise they can contribute to itching. 2.  Patient to continue her Nitroglycerin ointment in the future if she has return of burning pain, did discuss that if she calls our clinic within the next year and needs a refill of this cream we can do that for her. 3.  Patient will continue her cottonball placement and sitz baths for pruritus ani 4. Discussed that patient should make her sitz baths a little cooler if they seem as though they are making her rectum more sensitive. 5.  Patient to return to clinic with me or Dr. Fuller Plan as needed in the future.

## 2017-08-30 DIAGNOSIS — H5203 Hypermetropia, bilateral: Secondary | ICD-10-CM | POA: Diagnosis not present

## 2017-08-30 DIAGNOSIS — H2513 Age-related nuclear cataract, bilateral: Secondary | ICD-10-CM | POA: Diagnosis not present

## 2017-08-30 DIAGNOSIS — H52203 Unspecified astigmatism, bilateral: Secondary | ICD-10-CM | POA: Diagnosis not present

## 2017-08-30 DIAGNOSIS — H524 Presbyopia: Secondary | ICD-10-CM | POA: Diagnosis not present

## 2017-08-30 DIAGNOSIS — M069 Rheumatoid arthritis, unspecified: Secondary | ICD-10-CM | POA: Diagnosis not present

## 2017-08-30 DIAGNOSIS — Z79899 Other long term (current) drug therapy: Secondary | ICD-10-CM | POA: Diagnosis not present

## 2017-09-01 ENCOUNTER — Ambulatory Visit: Payer: Medicare HMO | Admitting: Physician Assistant

## 2017-09-01 ENCOUNTER — Encounter: Payer: Self-pay | Admitting: Physician Assistant

## 2017-09-01 VITALS — BP 108/72 | HR 74 | Ht 67.0 in | Wt 166.0 lb

## 2017-09-01 DIAGNOSIS — L290 Pruritus ani: Secondary | ICD-10-CM | POA: Diagnosis not present

## 2017-09-01 DIAGNOSIS — K602 Anal fissure, unspecified: Secondary | ICD-10-CM

## 2017-09-01 MED ORDER — AMBULATORY NON FORMULARY MEDICATION: 0.1250 mg | Freq: Three times a day (TID) | 3 refills | Status: DC

## 2017-09-01 NOTE — Progress Notes (Signed)
Reviewed and agree with initial management plan.  Evea Sheek T. Chloris Marcoux, MD FACG 

## 2017-09-01 NOTE — Patient Instructions (Signed)
We have sent a prescription for nitroglycerin 0.125% gel to [facility]. You should apply a pea size amount to your rectum three times daily x 6-8 weeks.  [facility]'s information is below: Address: [address], [place], [place] [address]  [phone]

## 2017-09-01 NOTE — Progress Notes (Signed)
Chief Complaint: Rectal pain/itching  HPI:    Bethany Grant is a 77 year old Caucasian female who follows in our clinic for her history of anal fissure and pruritus ani, she is a patient of Dr. Silvio Pate and returns to clinic today with a complaint of rectal pain/itching.    Please recall patient was last seen in clinic by me 07/02/17 and at that time was following up on anal fissure and pruritus ani.  Patient had previously been seen 06/03/17 and described clobetasol cream was not working for her burning and rectal itching.  She was given nitroglycerin ointment 3 times a day as well as use sitz baths and a cotton ball near her rectum to wick away the moisture.  Patient returned to clinic that day describing that her symptoms were much much better.  She had used her nitroglycerin ointment for 2 weeks and had been doing sitz bath's and was very happy.    Today, the patient tells me that about 2 weeks after being seen in clinic last she started again with some rectal pain.  Patient tells me the itching is still "not as big of a problem as it was".  The patient has been doing her sitz baths and has also continue to place a cotton ball on her rectum during the day.  Patient tells me typically she will not have an issue unless she passes gas or a bowel movement and then will experience some rectal burning and pain.  Patient tells me that she "never really stopped using my nitroglycerin cream".  Though it seems that the patient may be confused about which cream is which, clobetasol versus nitroglycerin.  She tells me she has been doing this at least once a day.  The patient is somewhat discouraged and asks if the removal of her external hemorrhoid tags would help with any of her symptoms.    Patient denies fever, chills, blood in her stool, weight loss or symptoms that awaken her at night.   Past Medical History:  Diagnosis Date  . DUB (dysfunctional uterine bleeding)   . Endometriosis   . Fibroid   .  Hypercholesterolemia   . Hypertension   . Rheumatoid arthritis(714.0)     Past Surgical History:  Procedure Laterality Date  . ABDOMINAL HYSTERECTOMY  1991   TAH/BSO  . BARTHOLIN GLAND CYST EXCISION  1980   RIGHT  . BREAST SURGERY  1988   BREAST CYST  . COLONOSCOPY    . COLONOSCOPY N/A 08/24/2013   Procedure: COLONOSCOPY;  Surgeon: Rogene Houston, MD;  [facility];  Service: Endoscopy;  Laterality: N/A;  100  . FOOT SURGERY     2 TIMES  . FOOT SURGERY Left   . HAND SURGERY    . HEMORRHOID SURGERY     been over 20 years ago  . OLECRANON BURSECTOMY Right 11/29/2013   Procedure: EXCISION OLECRANON BURSA RIGHT ;  Surgeon: Wynonia Sours, MD;  Location: [place];  Service: Orthopedics;  Laterality: Right;  . OOPHORECTOMY     BSO  . TENOSYNOVECTOMY Right 11/29/2013   Procedure: TENOSYNOVECTOMY FLEXOR TENDON RIGHT WRIST ;  Surgeon: Wynonia Sours, MD;  Location: [place];  Service: Orthopedics;  Laterality: Right;    Current Outpatient Medications  Medication Sig Dispense Refill  . ALPRAZolam (XANAX) 0.25 MG tablet Take 0.25 mg by mouth at bedtime as needed for sleep.    . AMBULATORY NON FORMULARY MEDICATION Place 0.125 mg rectally 3 (  three) times daily. Medication Name: Nitroglycerin ointment 0.125 mg 30 g 3  . aspirin 81 MG tablet Take 81 mg by mouth daily.    . cycloSPORINE (RESTASIS) 0.05 % ophthalmic emulsion Place 1 drop into both eyes 2 (two) times daily.     . folic acid (FOLVITE) 1 MG tablet Take 1 mg by mouth daily.    . hydroxychloroquine (PLAQUENIL) 200 MG tablet Take 400 mg by mouth daily.     Marland Kitchen lidocaine (XYLOCAINE) 2 % jelly Apply 1 application as needed topically. 30 mL 1  . losartan-hydrochlorothiazide (HYZAAR) 100-12.5 MG per tablet Take 1 tablet by mouth daily.    . methotrexate (RHEUMATREX) 2.5 MG tablet Take 3 tablet on Fri and 2 tablets on Sat    . nitroGLYCERIN (NITROSTAT) 0.4 MG SL tablet Place 1 tablet (0.4 mg total)  under the tongue every 5 (five) minutes as needed for chest pain. 25 tablet 3   No current facility-administered medications for this visit.     Allergies as of 09/01/2017 - Review Complete 09/01/2017  Allergen Reaction Noted  . Other Nausea And Vomiting 02/10/2013    Family History  Problem Relation Age of Onset  . Cancer Mother        LUNG  . Hypertension Sister   . Cancer Sister        LUNG, THYROID, UTERINE  . Cancer Brother        LUNG  . Cancer Sister        Lung cancer  . Colon cancer Neg Hx     Social History   Socioeconomic History  . Marital status: Married    Spouse name: Not on file  . Number of children: Not on file  . Years of education: Not on file  . Highest education level: Not on file  Social Needs  . Financial resource strain: Not on file  . Food insecurity - worry: Not on file  . Food insecurity - inability: Not on file  . Transportation needs - medical: Not on file  . Transportation needs - non-medical: Not on file  Occupational History  . Not on file  Tobacco Use  . Smoking status: Former Smoker    Packs/day: 0.80    Years: 30.00    Pack years: 24.00    Types: Cigarettes    Last attempt to quit: 07/27/2004    Years since quitting: 13.1  . Smokeless tobacco: Never Used  Substance and Sexual Activity  . Alcohol use: No    Alcohol/week: 0.0 oz  . Drug use: No  . Sexual activity: Yes    Birth control/protection: Surgical    Comment: Rare  Other Topics Concern  . Not on file  Social History Narrative  . Not on file    Review of Systems:    Constitutional: No weight loss, fever or chills Cardiovascular: No chest pain Respiratory: No SOB Gastrointestinal: See HPI and otherwise negative   Physical Exam:  Vital signs: BP 108/72   Pulse 74   Ht 5\' 7"  (1.702 m)   Wt 166 lb (75.3 kg)   LMP  (LMP Unknown)   BMI 26.00 kg/m   Constitutional:   Pleasant Elderly Caucasian female appears to be in NAD, Well developed, Well nourished,  alert and cooperative Respiratory: Respirations even and unlabored. Lungs clear to auscultation bilaterally.   No wheezes, crackles, or rhonchi.  Cardiovascular: Normal S1, S2. No MRG. Regular rate and rhythm. No peripheral edema, cyanosis or pallor.  Gastrointestinal:  Soft,  nondistended, nontender. No rebound or guarding. Normal bowel sounds. No appreciable masses or hepatomegaly. Rectal:  External: multiple hemorrhoid tags; Internal: Anal fissure right side of rectum Psychiatric:  Demonstrates good judgement and reason without abnormal affect or behaviors.  No recent labs or imaging.  Assessment: 1.  Pruritus Ani: Continues to be improved with cottonball placement, patient is discouraged that she will "have to put a cotton ball back there every day for the rest of my life", most likely related to external hemorrhoid tags and moisture 2.  Anal fissure: This has reopened at time of exam today, according to patient symptoms, this may have reopened a couple of months ago, unsure if patient has been using nitroglycerin as directed, she describes soft formed bowel movements and no straining or diarrhea  Plan: 1.  It appears patient's anal fissure has reopened, I am not sure she has been using nitroglycerin as directed. 2.  Refilled nitroglycerin to be applied to the inside of the rectum with a gloved finger 3 times a day.  Discussed this with the patient. 3.  Recommend the patient continue her sitz baths and placement of cotton ball over her rectum to wick away the moisture.  Discussed that she should discontinue her clobetasol cream for now. 4.  Patient was scheduled appointment with Dr. Fuller Plan in 6-8 weeks.  If at that time the patient is still not better, he can discuss with her options, such as sending her to [facility] for removal of hemorrhoid tags/fistulotomy or other.  Ellouise Newer, PA-C [facility] 09/01/2017, 11:05 AM  Cc: Celene Squibb, MD

## 2017-09-09 ENCOUNTER — Telehealth: Payer: Self-pay | Admitting: *Deleted

## 2017-09-09 ENCOUNTER — Ambulatory Visit: Payer: Medicare HMO | Admitting: Gynecology

## 2017-09-09 ENCOUNTER — Encounter: Payer: Self-pay | Admitting: Gynecology

## 2017-09-09 VITALS — BP 124/78 | Ht 67.0 in | Wt 162.0 lb

## 2017-09-09 DIAGNOSIS — N952 Postmenopausal atrophic vaginitis: Secondary | ICD-10-CM | POA: Diagnosis not present

## 2017-09-09 DIAGNOSIS — L290 Pruritus ani: Secondary | ICD-10-CM | POA: Diagnosis not present

## 2017-09-09 DIAGNOSIS — M8588 Other specified disorders of bone density and structure, other site: Secondary | ICD-10-CM

## 2017-09-09 DIAGNOSIS — Z01411 Encounter for gynecological examination (general) (routine) with abnormal findings: Secondary | ICD-10-CM

## 2017-09-09 DIAGNOSIS — M8580 Other specified disorders of bone density and structure, unspecified site: Secondary | ICD-10-CM

## 2017-09-09 MED ORDER — CLOBETASOL PROPIONATE 0.05 % EX CREA: TOPICAL_CREAM | CUTANEOUS | 0 refills | Status: DC

## 2017-09-09 MED ORDER — NYSTATIN 100000 UNIT/GM EX CREA: 1.0000 "application " | TOPICAL_CREAM | Freq: Two times a day (BID) | CUTANEOUS | 1 refills | Status: DC

## 2017-09-09 NOTE — Progress Notes (Signed)
    Bethany Grant 05/29/1941 [id number]        77 y.o.  [id number] for breast and pelvic exam.  Former patient of Dr. Toney Rakes.  Complaining of years worth of rectal itching externally.  Has tried a variety of creams and remedies without success.  Symptoms wax and wane.  History of hemorrhoids with hemorrhoid surgery a number of years ago.  No change in bowel habits.  No chronic diarrhea or constipation.  Past medical history,surgical history, problem list, medications, allergies, family history and social history were all reviewed and documented as reviewed in the EPIC chart.  ROS:  Performed with pertinent positives and negatives included in the history, assessment and plan.   Additional significant findings : None   Exam: Caryn Bee assistant Vitals:   09/09/17 0938  BP: 124/78  Weight: 162 lb (73.5 kg)  Height: 5\' 7"  (1.702 m)   Body mass index is 25.37 kg/m.  General appearance:  Normal affect, orientation and appearance. Skin: Grossly normal HEENT: Without gross lesions.  No cervical or supraclavicular adenopathy. Thyroid normal.  Lungs:  Clear without wheezing, rales or rhonchi Cardiac: RR, without RMG Abdominal:  Soft, nontender, without masses, guarding, rebound, organomegaly or hernia Breasts:  Examined lying and sitting without masses, retractions, discharge or axillary adenopathy. Pelvic:  Ext, BUS, Vagina: With atrophic changes.  Adnexa: Without masses or tenderness    Anus and perineum: Old external hemorrhoids.  No skin changes such as pigment changes or lesions.  Rectovaginal: Normal sphincter tone without palpated masses or tenderness.    Assessment/Plan:  77 y.o. [id number] female for breast and pelvic exam status post TAH/BSO in the past for leiomyoma and endometriosis.Marland Kitchen   1. Anal itching.  Has been going on for years.  Has tried variety of creams and treatments.  I recommended trial of nystatin cream twice daily to see if this is not related to fungus.  I also  recommended high-dose steroid cream clobetasol 0.05% nightly for 1-2 months and then taper.  Patient not sure if she has this at home and wants to check.  If she does not or it is expired she will call and will fill this.  We will see if more aggressively addressing an antifungal/high dose steroid approach does not help to resolve her symptoms.  I reviewed with her the chronic nature of this condition and that she may need repetitive treatments for relapse symptoms.  Patient will follow-up if she continues to have any issues with this. 2. Pap smear 2012.  No history of significant abnormal Pap smears.  She and Dr. Toney Rakes had discussed this and decided to stop screening per current screening guidelines based on age and hysterectomy history.  Patient is comfortable with this. 3. Osteopenia.  DEXA 2016 T score -2.2.  Recommended follow-up DEXA now a 2-year interval and patient agrees to schedule. 4. Mammography 05/2017.  Continue with annual mammography when due.  SBE monthly reviewed.  Breast exam normal today. 5. Colonoscopy 2015.  Repeat at their recommended interval. 6. Health maintenance.  No routine lab work done as patient does this elsewhere.  Follow-up in 1 year, sooner as needed.  Additional time in excess of her breast and pelvic exam was spent in direct face to face counseling and coordination of care in regards to her no itching.    Anastasio Auerbach MD, 10:23 AM 09/09/2017

## 2017-09-09 NOTE — Telephone Encounter (Signed)
Pt called back from today visit, asking if clobetasol 0.05% cream could be sent to pharmacy, she didn't have any at home as she thought. Rx sent.

## 2017-09-09 NOTE — Patient Instructions (Signed)
Apply the nystatin antifungal cream 1 or 2 times daily to the itchy area  Apply the clobetasol (Temovate) steroid cream nightly.  Call if you do not have this and we will call a prescription in for you

## 2017-09-24 DIAGNOSIS — M8580 Other specified disorders of bone density and structure, unspecified site: Secondary | ICD-10-CM

## 2017-09-24 HISTORY — DX: Other specified disorders of bone density and structure, unspecified site: M85.80

## 2017-09-27 ENCOUNTER — Other Ambulatory Visit: Payer: Self-pay | Admitting: Gynecology

## 2017-09-27 ENCOUNTER — Encounter: Payer: Self-pay | Admitting: Gynecology

## 2017-09-27 ENCOUNTER — Ambulatory Visit (INDEPENDENT_AMBULATORY_CARE_PROVIDER_SITE_OTHER): Payer: Medicare HMO

## 2017-09-27 DIAGNOSIS — M8589 Other specified disorders of bone density and structure, multiple sites: Secondary | ICD-10-CM

## 2017-09-27 DIAGNOSIS — M8580 Other specified disorders of bone density and structure, unspecified site: Secondary | ICD-10-CM

## 2017-10-13 DIAGNOSIS — M0579 Rheumatoid arthritis with rheumatoid factor of multiple sites without organ or systems involvement: Secondary | ICD-10-CM | POA: Diagnosis not present

## 2017-10-13 DIAGNOSIS — Z6826 Body mass index (BMI) 26.0-26.9, adult: Secondary | ICD-10-CM | POA: Diagnosis not present

## 2017-10-13 DIAGNOSIS — E663 Overweight: Secondary | ICD-10-CM | POA: Diagnosis not present

## 2017-10-19 ENCOUNTER — Ambulatory Visit: Payer: Medicare HMO | Admitting: Gastroenterology

## 2017-10-21 ENCOUNTER — Ambulatory Visit: Payer: Medicare HMO | Admitting: Gastroenterology

## 2017-10-21 ENCOUNTER — Encounter: Payer: Self-pay | Admitting: Gastroenterology

## 2017-10-21 VITALS — BP 118/60 | HR 76 | Ht 67.5 in | Wt 166.5 lb

## 2017-10-21 DIAGNOSIS — L290 Pruritus ani: Secondary | ICD-10-CM

## 2017-10-21 DIAGNOSIS — K602 Anal fissure, unspecified: Secondary | ICD-10-CM

## 2017-10-21 NOTE — Patient Instructions (Signed)
Continue nitroglycerin ointment rectally twice daily x 10 weeks and clobetasol cream twice daily x 10 weeks.   RECTAL CARE INSTRUCTIONS:  1. Sitz Baths twice a day for 10 minutes each. 2. Thoroughly clean and dry the rectum. 3. Put Tucks pad against the rectum at night. 4. Clean the rectum with Balenol lotion after each bowel movement.  Call our office back if your systems do not resolve.   Normal BMI (Body Mass Index- based on height and weight) is between 23 and 30. Your BMI today is Body mass index is 25.69 kg/m. Marland Kitchen Please consider follow up  regarding your BMI with your Primary Care Provider.  Thank you for choosing me and [facility].  Pricilla Riffle. Dagoberto Ligas., MD., [facility]

## 2017-10-21 NOTE — Progress Notes (Signed)
    History of Present Illness: This is a 77 year old female with pruritis ani and an anal fissure.  Recently evaluated by Ellouise Newer, PA-C in February.  Symptoms resolved following treatment with nitroglycerin and clobetasol.  Patient discontinued medications and her symptoms returned about 3 weeks ago. She resumed treatment and her symptoms have now resolved.  Colonoscopy by Dr. Claris Gower: complete exam, satisfactory prep, normal except a single sigmoid diverticulum.  Current Medications, Allergies, Past Medical History, Past Surgical History, Family History and Social History were reviewed in [employer] record.  Physical Exam: General: Well developed, well nourished, no acute distress Head: Normocephalic and atraumatic Eyes:  sclerae anicteric, EOMI Ears: Normal auditory acuity Mouth: No deformity or lesions Lungs: Clear throughout to auscultation Heart: Regular rate and rhythm; no murmurs, rubs or bruits Abdomen: Soft, non tender and non distended. No masses, hepatosplenomegaly or hernias noted. Normal Bowel sounds Rectal: Multiple mildly erythematous external hemorrhoidal tags, no fissure or internal lesions noted, no anal canal tenderness, Hemoccult negative brown stool in the vault Musculoskeletal: Symmetrical with no gross deformities  Pulses:  Normal pulses noted Extremities: No clubbing, cyanosis, edema or deformities noted Neurological: Alert oriented x 4, grossly nonfocal Psychological:  Alert and cooperative. Normal mood and affect  Assessment and Recommendations:  1.  Pruritus ani, recent anal fissure.  Continue nitroglycerin ointment twice daily for a total of 10 weeks.  Continue standard rectal care instructions.  Call if symptoms return.

## 2017-11-15 DIAGNOSIS — R944 Abnormal results of kidney function studies: Secondary | ICD-10-CM | POA: Diagnosis not present

## 2017-11-15 DIAGNOSIS — R5383 Other fatigue: Secondary | ICD-10-CM | POA: Diagnosis not present

## 2017-11-15 DIAGNOSIS — N390 Urinary tract infection, site not specified: Secondary | ICD-10-CM | POA: Diagnosis not present

## 2017-11-15 DIAGNOSIS — D649 Anemia, unspecified: Secondary | ICD-10-CM | POA: Diagnosis not present

## 2017-11-15 DIAGNOSIS — I4510 Unspecified right bundle-branch block: Secondary | ICD-10-CM | POA: Diagnosis not present

## 2017-11-15 DIAGNOSIS — R911 Solitary pulmonary nodule: Secondary | ICD-10-CM | POA: Diagnosis not present

## 2017-11-15 DIAGNOSIS — M0600 Rheumatoid arthritis without rheumatoid factor, unspecified site: Secondary | ICD-10-CM | POA: Diagnosis not present

## 2017-11-15 DIAGNOSIS — D72819 Decreased white blood cell count, unspecified: Secondary | ICD-10-CM | POA: Diagnosis not present

## 2017-11-15 DIAGNOSIS — I452 Bifascicular block: Secondary | ICD-10-CM | POA: Diagnosis not present

## 2017-11-15 DIAGNOSIS — I10 Essential (primary) hypertension: Secondary | ICD-10-CM | POA: Diagnosis not present

## 2017-11-22 DIAGNOSIS — Z6826 Body mass index (BMI) 26.0-26.9, adult: Secondary | ICD-10-CM | POA: Diagnosis not present

## 2017-11-22 DIAGNOSIS — M0600 Rheumatoid arthritis without rheumatoid factor, unspecified site: Secondary | ICD-10-CM | POA: Diagnosis not present

## 2017-11-22 DIAGNOSIS — R944 Abnormal results of kidney function studies: Secondary | ICD-10-CM | POA: Diagnosis not present

## 2017-11-22 DIAGNOSIS — R69 Illness, unspecified: Secondary | ICD-10-CM | POA: Diagnosis not present

## 2017-11-22 DIAGNOSIS — I10 Essential (primary) hypertension: Secondary | ICD-10-CM | POA: Diagnosis not present

## 2017-11-24 ENCOUNTER — Telehealth: Payer: Self-pay | Admitting: Gastroenterology

## 2017-11-24 NOTE — Telephone Encounter (Signed)
Nitroglycerin pr qid for 8 weeks and witch hazel tid prn separated in time from nitroglycerin Gentle washing with mild soap such as Dove no more than once a day.  Baby wipes post BM.  Nothing else on perianal area.

## 2017-11-24 NOTE — Telephone Encounter (Signed)
Patient reports that she is still having rectal itching.  Worse at night.  She has followed all instructions that were given to her at the last office visit including NTG ointment and SITZ baths.  Itching and burning "Is driving me nuts".  She reports that she has tried other creams with no relief either.  She is asking what else can be done?

## 2017-11-25 NOTE — Telephone Encounter (Signed)
Patient notified of the recommendations.  

## 2017-12-30 ENCOUNTER — Encounter: Payer: Self-pay | Admitting: Cardiology

## 2017-12-30 ENCOUNTER — Ambulatory Visit: Payer: Medicare HMO | Admitting: Cardiology

## 2017-12-30 VITALS — BP 112/68 | HR 56 | Ht 67.0 in | Wt 165.1 lb

## 2017-12-30 DIAGNOSIS — I4510 Unspecified right bundle-branch block: Secondary | ICD-10-CM

## 2017-12-30 DIAGNOSIS — I444 Left anterior fascicular block: Secondary | ICD-10-CM | POA: Diagnosis not present

## 2017-12-30 DIAGNOSIS — M069 Rheumatoid arthritis, unspecified: Secondary | ICD-10-CM | POA: Diagnosis not present

## 2017-12-30 DIAGNOSIS — I10 Essential (primary) hypertension: Secondary | ICD-10-CM | POA: Diagnosis not present

## 2017-12-30 DIAGNOSIS — R5383 Other fatigue: Secondary | ICD-10-CM

## 2017-12-30 NOTE — Progress Notes (Signed)
[name]. [address] [place], [name]  [address] Phone: [phone] Fax:  [phone]  Date:  12/30/2017   ID:  Bethany Grant, DOB 31-Dec-1940, MRN [id number]  PCP:  Celene Squibb, MD   History of Present Illness: Bethany Grant is a 77 y.o. female here for follow up of stress test secondary to dyspnea.  Currently with no symptoms. Has a history of hyperlipidemia, hypertension and rheumatoid arthritis.  EKG demonstrated RBBB, LAFB, bifascicular block. PACs noted. Asymptomatic.  She has no early family history of CAD. She used to smoke in the distant past. She does have rheumatoid arthritis. Previous carpal tunnel surgery, cyst removal.  She exercises. Enjoys gardening. No syncope.  Her husband had stent placement after cardiac catheterization. He was having similar symptoms of fatigue. She is concerned because she feels tired. Could be anemia, could be methotrexate could be rheumatoid arthritis.  Nuclear stress test in 2016 was reassuring, low risk.  Once again nuclear stress test in 2018 was also low risk. This was ordered because of some dyspnea on exertion and some mild chest heaviness that she felt. Perhaps it was allergies.  12/30/2017- still has some fatigue.  No significant change.  No anginal symptoms, no significant shortness of breath.  Medications reviewed.   Wt Readings from Last 3 Encounters:  12/30/17 165 lb 1.9 oz (74.9 kg)  10/21/17 166 lb 8 oz (75.5 kg)  09/09/17 162 lb (73.5 kg)     Past Medical History:  Diagnosis Date  . Hypercholesterolemia   . Hypertension   . Osteopenia 09/2017   T score -2.1 FRAX 18% / 2.7%  . Rheumatoid arthritis(714.0)     Past Surgical History:  Procedure Laterality Date  . ABDOMINAL HYSTERECTOMY  1991   TAH/BSO  . BARTHOLIN GLAND CYST EXCISION  1980   RIGHT  . BREAST SURGERY  1988   BREAST CYST  . COLONOSCOPY    . COLONOSCOPY N/A 08/24/2013   Procedure: COLONOSCOPY;  Surgeon: Rogene Houston, MD;  [facility];  Service: Endoscopy;  Laterality: N/A;  100  . FOOT SURGERY     2 TIMES  . FOOT SURGERY Left   . HAND SURGERY    . HEMORRHOID SURGERY     been over 20 years ago  . OLECRANON BURSECTOMY Right 11/29/2013   Procedure: EXCISION OLECRANON BURSA RIGHT ;  Surgeon: Wynonia Sours, MD;  Location: [facility];  Service: Orthopedics;  Laterality: Right;  . OOPHORECTOMY     BSO  . TENOSYNOVECTOMY Right 11/29/2013   Procedure: TENOSYNOVECTOMY FLEXOR TENDON RIGHT WRIST ;  Surgeon: Wynonia Sours, MD;  Location: [facility];  Service: Orthopedics;  Laterality: Right;    Current Outpatient Medications  Medication Sig Dispense Refill  . ALPRAZolam (XANAX) 0.25 MG tablet Take 0.25 mg by mouth at bedtime as needed for sleep.    Marland Kitchen aspirin 81 MG tablet Take 81 mg by mouth daily.    . cycloSPORINE (RESTASIS) 0.05 % ophthalmic emulsion Place 1 drop into both eyes 2 (two) times daily.     . folic acid (FOLVITE) 1 MG tablet Take 1 mg by mouth daily.    . hydroxychloroquine (PLAQUENIL) 200 MG tablet Take 400 mg by mouth daily.     Marland Kitchen losartan-hydrochlorothiazide (HYZAAR) 100-12.5 MG per tablet Take 1 tablet by mouth daily.    . methotrexate (RHEUMATREX) 2.5 MG tablet Take 3 tablet on Fri and 2 tablets  on Sat    . nitroGLYCERIN (NITROSTAT) 0.4 MG SL tablet Place 1 tablet (0.4 mg total) under the tongue every 5 (five) minutes as needed for chest pain. 25 tablet 3   No current facility-administered medications for this visit.     Allergies:    Allergies  Allergen Reactions  . Other Nausea And Vomiting    Doesn't do well with pain  Medications, sensitivity to tape    Social History:  The patient  reports that she quit smoking about 13 years ago. Her smoking use included cigarettes. She has a 24.00 pack-year smoking history. She has never used smokeless tobacco. She reports that she does not drink alcohol or use drugs.   Family History  Problem Relation Age of Onset  .  Cancer Mother        LUNG  . Hypertension Sister   . Cancer Sister        LUNG, THYROID, UTERINE  . Cancer Brother        LUNG  . Cancer Sister        Lung cancer  . Colon cancer Neg Hx     ROS:  Please see the history of present illness.    Unless stated above, all other review of systems negative.  PHYSICAL EXAM: VS:  BP 112/68   Pulse (!) 56   Ht 5\' 7"  (1.702 m)   Wt 165 lb 1.9 oz (74.9 kg)   LMP  (LMP Unknown)   SpO2 97%   BMI 25.86 kg/m  GEN: Well nourished, well developed, in no acute distress  HEENT: normal  Neck: no JVD, carotid bruits, or masses Cardiac: RRR; no murmurs, rubs, or gallops,no edema  Respiratory:  clear to auscultation bilaterally, normal work of breathing GI: soft, nontender, nondistended, + BS MS: no deformity or atrophy, ulnar deviation of hands noted Skin: warm and dry, no rash Neuro:  Alert and Oriented x 3, Strength and sensation are intact Psych: euthymic mood, full affect    EKG:  02/24/16-sinus bradycardia 58, left anterior fascicular block, right bundle branch block-bifascicular block personally viewed-no significant change from prior 12/20/14-sinus bradycardia rate 57) a branch block left anterior fascicular block bifascicular block-previous EKG 11/28/13-sinus rhythm, PAC, right bundle branch block, left anterior fascicular block, bifascicular block  Labs: Creatinine 1.07, potassium 4.3, LDL 99, HDL 46, TSH 2.5, CK 33   Prior medical records reviewed  NUC stress 12/16/16  Nuclear stress EF: 54%.  There was no ST segment deviation noted during stress.  This is a low risk study.  The left ventricular ejection fraction is normal (55-65%).  Defect 1: There is a small defect of mild severity present in the basal inferoseptal and mid inferoseptal location. This is a fixed defect likely attributable to RBBB. There is no ischmia.  ASSESSMENT AND PLAN:  Dyspnea/atypical chest pain  - Reassuring nuclear stress test as above.  - Discussed in  clinic once again, reviewed results..  - She is walking daily without any difficulties, other than foot pain. Still with mild fatigue however. Could be because of her rheumatoid arthritis medications she thinks.  Right bundle branch block/left anterior fascicular block/bifascicular block  - Pacemaker could be a possibility in the future. Try to avoid AV nodal blocking agents.  No changes made.  Essential hypertension  - Currently well controlled. No changes.  Fatigue - Likely multifactorial.  Sinus bradycardia -Noted on ECG 05/13/2017.  She did have chronotropic competence previously noted.  No syncope.  One-year follow-up.  Signed,  Candee Furbish, MD [facility]  12/30/2017 10:35 AM

## 2017-12-30 NOTE — Patient Instructions (Signed)

## 2018-01-19 DIAGNOSIS — M0579 Rheumatoid arthritis with rheumatoid factor of multiple sites without organ or systems involvement: Secondary | ICD-10-CM | POA: Diagnosis not present

## 2018-03-03 DIAGNOSIS — I452 Bifascicular block: Secondary | ICD-10-CM | POA: Diagnosis not present

## 2018-03-03 DIAGNOSIS — R944 Abnormal results of kidney function studies: Secondary | ICD-10-CM | POA: Diagnosis not present

## 2018-03-03 DIAGNOSIS — I4510 Unspecified right bundle-branch block: Secondary | ICD-10-CM | POA: Diagnosis not present

## 2018-03-03 DIAGNOSIS — Z6825 Body mass index (BMI) 25.0-25.9, adult: Secondary | ICD-10-CM | POA: Diagnosis not present

## 2018-03-03 DIAGNOSIS — H6500 Acute serous otitis media, unspecified ear: Secondary | ICD-10-CM | POA: Diagnosis not present

## 2018-03-03 DIAGNOSIS — D72819 Decreased white blood cell count, unspecified: Secondary | ICD-10-CM | POA: Diagnosis not present

## 2018-03-03 DIAGNOSIS — R911 Solitary pulmonary nodule: Secondary | ICD-10-CM | POA: Diagnosis not present

## 2018-03-03 DIAGNOSIS — N390 Urinary tract infection, site not specified: Secondary | ICD-10-CM | POA: Diagnosis not present

## 2018-03-03 DIAGNOSIS — D649 Anemia, unspecified: Secondary | ICD-10-CM | POA: Diagnosis not present

## 2018-03-03 DIAGNOSIS — J019 Acute sinusitis, unspecified: Secondary | ICD-10-CM | POA: Diagnosis not present

## 2018-03-24 DIAGNOSIS — M0600 Rheumatoid arthritis without rheumatoid factor, unspecified site: Secondary | ICD-10-CM | POA: Diagnosis not present

## 2018-03-24 DIAGNOSIS — I10 Essential (primary) hypertension: Secondary | ICD-10-CM | POA: Diagnosis not present

## 2018-03-24 DIAGNOSIS — R69 Illness, unspecified: Secondary | ICD-10-CM | POA: Diagnosis not present

## 2018-03-24 DIAGNOSIS — R944 Abnormal results of kidney function studies: Secondary | ICD-10-CM | POA: Diagnosis not present

## 2018-04-07 DIAGNOSIS — M26629 Arthralgia of temporomandibular joint, unspecified side: Secondary | ICD-10-CM | POA: Insufficient documentation

## 2018-04-15 DIAGNOSIS — E663 Overweight: Secondary | ICD-10-CM | POA: Diagnosis not present

## 2018-04-15 DIAGNOSIS — M0579 Rheumatoid arthritis with rheumatoid factor of multiple sites without organ or systems involvement: Secondary | ICD-10-CM | POA: Diagnosis not present

## 2018-04-15 DIAGNOSIS — Z6825 Body mass index (BMI) 25.0-25.9, adult: Secondary | ICD-10-CM | POA: Diagnosis not present

## 2018-04-18 DIAGNOSIS — R0981 Nasal congestion: Secondary | ICD-10-CM | POA: Diagnosis not present

## 2018-04-18 DIAGNOSIS — J060 Acute laryngopharyngitis: Secondary | ICD-10-CM | POA: Diagnosis not present

## 2018-04-18 DIAGNOSIS — R05 Cough: Secondary | ICD-10-CM | POA: Diagnosis not present

## 2018-04-20 DIAGNOSIS — J181 Lobar pneumonia, unspecified organism: Secondary | ICD-10-CM | POA: Diagnosis not present

## 2018-04-20 DIAGNOSIS — I10 Essential (primary) hypertension: Secondary | ICD-10-CM | POA: Diagnosis not present

## 2018-04-20 DIAGNOSIS — R05 Cough: Secondary | ICD-10-CM | POA: Diagnosis not present

## 2018-04-21 ENCOUNTER — Ambulatory Visit: Payer: Medicare HMO | Admitting: Primary Care

## 2018-04-21 ENCOUNTER — Ambulatory Visit (INDEPENDENT_AMBULATORY_CARE_PROVIDER_SITE_OTHER)
Admission: RE | Admit: 2018-04-21 | Discharge: 2018-04-21 | Disposition: A | Discharge status: Home / Self Care | Payer: Medicare HMO | Source: Ambulatory Visit | Attending: Primary Care | Admitting: Primary Care

## 2018-04-21 ENCOUNTER — Encounter: Payer: Self-pay | Admitting: Primary Care

## 2018-04-21 ENCOUNTER — Other Ambulatory Visit (INDEPENDENT_AMBULATORY_CARE_PROVIDER_SITE_OTHER): Payer: Medicare HMO

## 2018-04-21 ENCOUNTER — Telehealth: Payer: Self-pay | Admitting: Primary Care

## 2018-04-21 VITALS — BP 120/68 | HR 67 | Ht 67.0 in | Wt 162.0 lb

## 2018-04-21 DIAGNOSIS — R059 Cough, unspecified: Secondary | ICD-10-CM

## 2018-04-21 DIAGNOSIS — J3489 Other specified disorders of nose and nasal sinuses: Secondary | ICD-10-CM | POA: Insufficient documentation

## 2018-04-21 DIAGNOSIS — J40 Bronchitis, not specified as acute or chronic: Secondary | ICD-10-CM

## 2018-04-21 DIAGNOSIS — R05 Cough: Secondary | ICD-10-CM

## 2018-04-21 DIAGNOSIS — R079 Chest pain, unspecified: Secondary | ICD-10-CM | POA: Diagnosis not present

## 2018-04-21 LAB — BASIC METABOLIC PANEL
BUN: 16 mg/dL (ref 6–23)
CO2: 28 meq/L (ref 19–32)
Calcium: 8.9 mg/dL (ref 8.4–10.5)
Chloride: 95 mEq/L — ABNORMAL LOW (ref 96–112)
Creatinine, Ser: 1.17 mg/dL (ref 0.40–1.20)
GFR: 47.95 mL/min — ABNORMAL LOW (ref >60.00)
Glucose, Bld: 93 mg/dL (ref 70–99)
Potassium: 3.6 mEq/L (ref 3.5–5.1)
Sodium: 131 mEq/L — ABNORMAL LOW (ref 135–145)

## 2018-04-21 LAB — CBC WITH DIFFERENTIAL/PLATELET
BASOS ABS: 0 10*3/uL (ref 0.0–0.1)
Basophils Relative: 0.6 % (ref 0.0–3.0)
EOS ABS: 0.1 10*3/uL (ref 0.0–0.7)
Eosinophils Relative: 2 % (ref 0.0–5.0)
HEMATOCRIT: 38.3 % (ref 36.0–46.0)
Hemoglobin: 12.9 g/dL (ref 12.0–15.0)
LYMPHS ABS: 0.9 10*3/uL (ref 0.7–4.0)
LYMPHS PCT: 20.4 % (ref 12.0–46.0)
MCHC: 33.7 g/dL (ref 30.0–36.0)
MCV: 93.2 fl (ref 78.0–100.0)
Monocytes Absolute: 0.8 10*3/uL (ref 0.1–1.0)
Monocytes Relative: 17.8 % — ABNORMAL HIGH (ref 3.0–12.0)
NEUTROS ABS: 2.5 10*3/uL (ref 1.4–7.7)
NEUTROS PCT: 59.2 % (ref 43.0–77.0)
PLATELETS: 130 10*3/uL — AB (ref 150.0–400.0)
RBC: 4.11 Mil/uL (ref 3.87–5.11)
RDW: 15.8 % — ABNORMAL HIGH (ref 11.5–15.5)
WBC: 4.3 10*3/uL (ref 4.0–10.5)

## 2018-04-21 NOTE — Patient Instructions (Addendum)
Needs CXR and labs today  Continue doxycyline as prescribed Use rescue inhaler as needed for shortness of breath/wheezing- 2 puffs every 4-6 hours Continue mucinex twice daily   Rest and stay well hydrated. Good hand washing.  I hope you feel better, I will call you once we get testing back

## 2018-04-21 NOTE — Assessment & Plan Note (Signed)
CXR showed no evidence of pneumonia, peribronchial thickening consistent with bronchitis

## 2018-04-21 NOTE — Progress Notes (Signed)
@Patient  ID: Bethany Grant, female    DOB: 09/21/1940, 77 y.o.   MRN: [id number]  Chief Complaint  Patient presents with  . Acute Visit    chest tightness, weak, wheezing, cough with clear mucus x week and a half    Referring provider: Celene Squibb, MD  HPI: 77 year old female, former smoker quit 2006 (24 pack year hx). PMH ILD/rheumatoid lung disease, rhinitis, RA, hypertension. Patient of Dr. Halford Chessman, last seen 05/13/2017. Followed by Dr. Amil Amen with rheumatology for management of RA, remains onf MTX and plaquenil.   Patient was originally seen by PCP for cough, recommended mucinex. Seen by UC yesterday for np cough x 8 days. Noted to have wheezes and rales to lung field and was diagnosed with left lower lobe PNA without chest xray. Given doxycyline 100mg  twice daily x 7 days. Tessalon perles as needed for cough. Albuterol rescue inhaler.   04/21/2018 Patient presents today for acute visit with complaints of cough and wheezing. No reported fevers. Cough is mostly dry. Has some rib pain to right anterior chest wall. Started doxycyline course yesterday, wants to make sure she is on the right antibiotic. No recent hospitalization. Recent sick contact, husband treated for suspected PNA. RA symptoms appear controlled. Holding MTX per rheumatology recommendation while on abx. She has only used rescue inhaler once or twice. Denies shortness of breath.    Allergies  Allergen Reactions  . Other Nausea And Vomiting    Doesn't do well with pain  Medications, sensitivity to tape    Immunization History  Administered Date(s) Administered  . Influenza Split 04/27/2011, 04/26/2013  . Influenza Whole 04/26/2009, 04/26/2010  . Influenza,inj,Quad PF,6+ Mos 05/18/2016  . Influenza-Unspecified 07/09/2014  . Pneumococcal-Unspecified 07/09/2014  . Zoster 09/13/2014    Past Medical History:  Diagnosis Date  . Hypercholesterolemia   . Hypertension   . Osteopenia 09/2017   T score -2.1 FRAX 18% /  2.7%  . Rheumatoid arthritis(714.0)     Tobacco History: Social History   Tobacco Use  Smoking Status Former Smoker  . Packs/day: 0.80  . Years: 30.00  . Pack years: 24.00  . Types: Cigarettes  . Last attempt to quit: 07/27/2004  . Years since quitting: 13.7  Smokeless Tobacco Never Used   Counseling given: Not Answered   Outpatient Medications Prior to Visit  Medication Sig Dispense Refill  . ALPRAZolam (XANAX) 0.25 MG tablet Take 0.25 mg by mouth at bedtime as needed for sleep.    . cycloSPORINE (RESTASIS) 0.05 % ophthalmic emulsion Place 1 drop into both eyes 2 (two) times daily.     . folic acid (FOLVITE) 1 MG tablet Take 1 mg by mouth daily.    . hydroxychloroquine (PLAQUENIL) 200 MG tablet Take 400 mg by mouth daily.     Marland Kitchen losartan-hydrochlorothiazide (HYZAAR) 100-12.5 MG per tablet Take 1 tablet by mouth daily.    . methotrexate (RHEUMATREX) 2.5 MG tablet Take 3 tablet on Fri and 2 tablets on Sat    . aspirin 81 MG tablet Take 81 mg by mouth daily.    . benzonatate (TESSALON) 100 MG capsule     . doxycycline (VIBRAMYCIN) 100 MG capsule     . nitroGLYCERIN (NITROSTAT) 0.4 MG SL tablet Place 1 tablet (0.4 mg total) under the tongue every 5 (five) minutes as needed for chest pain. 25 tablet 3   No facility-administered medications prior to visit.     Review of Systems  Review of Systems  Constitutional: Negative.  HENT: Negative.   Respiratory: Positive for cough and wheezing. Negative for apnea, shortness of breath and stridor.   Cardiovascular: Negative.     Physical Exam  BP 120/68 (BP Location: Left Arm, Cuff Size: Normal)   Pulse 67   Ht 5\' 7"  (1.702 m)   Wt 162 lb (73.5 kg)   LMP  (LMP Unknown)   SpO2 92%   BMI 25.37 kg/m  Physical Exam  Constitutional: She is oriented to person, place, and time. She appears well-developed and well-nourished. No distress.  HENT:  Head: Normocephalic and atraumatic.  Eyes: Pupils are equal, round, and reactive to  light. EOM are normal.  Neck: Normal range of motion. Neck supple.  Cardiovascular: Normal rate and regular rhythm.  Pulmonary/Chest: Effort normal. No stridor. No respiratory distress. She has wheezes. She exhibits tenderness.  Diffuse insp wheeze and rhonchi t/o lung field. No resp distress.   Neurological: She is alert and oriented to person, place, and time.  Skin: Skin is warm and dry.  Psychiatric: She has a normal mood and affect. Her behavior is normal. Judgment and thought content normal.     Lab Results:  CBC    Component Value Date/Time   WBC 7.2 02/16/2013 0900   RBC 4.50 02/16/2013 0900   HGB 12.7 02/16/2013 0900   HCT 39.9 02/16/2013 0900   PLT 167 02/16/2013 0900   MCV 88.7 02/16/2013 0900   MCH 28.2 02/16/2013 0900   MCHC 31.8 02/16/2013 0900   RDW 12.7 02/16/2013 0900    BMET    Component Value Date/Time   NA 136 (L) 11/28/2013 1430   K 4.5 11/28/2013 1430   CL 98 11/28/2013 1430   CO2 27 11/28/2013 1430   GLUCOSE 87 11/28/2013 1430   BUN 14 11/28/2013 1430   CREATININE 1.02 11/28/2013 1430   CALCIUM 8.1 (L) 07/01/2015 1050   GFRNONAA 54 (L) 11/28/2013 1430   GFRAA 63 (L) 11/28/2013 1430    BNP No results found for: BNP  ProBNP No results found for: PROBNP  Imaging: Dg Chest 2 View  Result Date: 04/21/2018 CLINICAL DATA:  Cough, lower chest pain for 1-2 weeks, former smoking history EXAM: CHEST - 2 VIEW COMPARISON:  Chest x-ray of 05/13/2017 FINDINGS: No active infiltrate or effusion is seen. The lungs are hyperaerated with flattened hemidiaphragms suggesting and element of emphysema. There is some peribronchial thickening present which may indicate bronchitis. The heart is within upper limits of normal. There is a mild thoracolumbar curvature present with degenerative change. IMPRESSION: 1. No pneumonia or effusion.  Suspect emphysema. 2. Peribronchial thickening most consistent with bronchitis. 3. Thoracolumbar curvature. Electronically Signed    By: Ivar Drape M.D.   On: 04/21/2018 12:10     Assessment & Plan:   Cough Seen at UC yesterday and dx with LLL PNA without CXR Ok to continue doxycyline 100mg  BID x 7 days  Continue mucinex twice daily and tessalon perles prn cough  Albuterol rescue inhaler prn sob/wheeze q4-6hours  Needs to check CXR and labs today  RA (rheumatoid arthritis) Stable; no reports of recent flare. Symptoms appear controlled.  Continues MTX and plaquinel (MTX on hold while taking abx per rheumatology)  Bronchitis CXR showed no evidence of pneumonia, peribronchial thickening consistent with bronchitis     Martyn Ehrich, NP 04/21/2018

## 2018-04-21 NOTE — Assessment & Plan Note (Signed)
Stable; no reports of recent flare. Symptoms appear controlled.  Continues MTX and plaquinel (MTX on hold while taking abx per rheumatology)

## 2018-04-21 NOTE — Assessment & Plan Note (Signed)
Seen at [facility] yesterday and dx with LLL PNA without CXR Ok to continue doxycyline 100mg  BID x 7 days  Continue mucinex twice daily and tessalon perles prn cough  Albuterol rescue inhaler prn sob/wheeze q4-6hours  Needs to check CXR and labs today

## 2018-04-21 NOTE — Telephone Encounter (Signed)
Spoke to pt, who states she is returning a call from [place].  Best contact number is [phone].  Routing to Nathalie to make aware.

## 2018-04-22 NOTE — Telephone Encounter (Signed)
Spoke with pt. She is aware of results. Nothing further was needed.  

## 2018-04-22 NOTE — Telephone Encounter (Signed)
Patient returned phone call.Marland KitchenMarland KitchenContact # [phone]

## 2018-04-22 NOTE — Telephone Encounter (Signed)
LMOM to call back X1

## 2018-04-22 NOTE — Telephone Encounter (Signed)
Spoke with pt. She is aware of her CXR results. Pt is demanding her lab work results.  Beth - please advise. Thanks.

## 2018-04-22 NOTE — Telephone Encounter (Signed)
Attempted to call again, no answer. Will have Lattie Haw try again. CXR showed no evidence of PNA, consistent with bronchitis. Ok to continue abx.

## 2018-04-22 NOTE — Telephone Encounter (Signed)
Please let patient know her labs looked ok. WBC and HGB were normal. Sodium was slightly low, tell her to salt her food. Platelets were also a little low, not acutely concerning but should follow up with PCP/rheumatoloy may be related to RA/methotrexate.

## 2018-04-26 NOTE — Progress Notes (Signed)
Reviewed and agree with assessment/plan.   Ilir Mahrt, MD [facility] 07/22/2016, 12:24 PM Pager:  [phone]  

## 2018-04-28 ENCOUNTER — Telehealth: Payer: Self-pay | Admitting: Primary Care

## 2018-04-28 MED ORDER — PREDNISONE 10 MG PO TABS: ORAL_TABLET | ORAL | 0 refills | Status: DC

## 2018-04-28 NOTE — Telephone Encounter (Signed)
Please send prednisone 10 mg pill >> 3 pills daily for 2 days, 2 pills daily for 2 days, 1 pill daily for 2 days.  #12 with no refills.

## 2018-04-28 NOTE — Telephone Encounter (Signed)
Called and spoke with pt who states she is still having problems with congestion in her chest and is still coughing up clear mucus.  Pt states she has been taking mucinex as well as an OTC allergy pill (allergen).  Pt states it has improved since OV with Derl Barrow, NP on 9/26 but states her symptoms will not go away.  Pt wants to know if something could be prescribed to help with her symptoms.  Dr. Halford Chessman, please advise on recommendations for pt. Thanks!

## 2018-04-28 NOTE — Telephone Encounter (Signed)
Called and spoke with pt letting her know the recs stated per VS as to send pred taper in for pt.  Pt expressed understanding. Rx sent to pt's preferred pharmacy. Nothing further needed.

## 2018-05-09 ENCOUNTER — Other Ambulatory Visit (HOSPITAL_COMMUNITY): Payer: Self-pay | Admitting: Internal Medicine

## 2018-05-09 DIAGNOSIS — Z1231 Encounter for screening mammogram for malignant neoplasm of breast: Secondary | ICD-10-CM

## 2018-05-19 DIAGNOSIS — R69 Illness, unspecified: Secondary | ICD-10-CM | POA: Diagnosis not present

## 2018-05-30 DIAGNOSIS — I10 Essential (primary) hypertension: Secondary | ICD-10-CM | POA: Diagnosis not present

## 2018-06-02 DIAGNOSIS — M0600 Rheumatoid arthritis without rheumatoid factor, unspecified site: Secondary | ICD-10-CM | POA: Diagnosis not present

## 2018-06-02 DIAGNOSIS — Z0000 Encounter for general adult medical examination without abnormal findings: Secondary | ICD-10-CM | POA: Diagnosis not present

## 2018-06-02 DIAGNOSIS — N182 Chronic kidney disease, stage 2 (mild): Secondary | ICD-10-CM | POA: Diagnosis not present

## 2018-06-02 DIAGNOSIS — M8580 Other specified disorders of bone density and structure, unspecified site: Secondary | ICD-10-CM | POA: Diagnosis not present

## 2018-06-02 DIAGNOSIS — L989 Disorder of the skin and subcutaneous tissue, unspecified: Secondary | ICD-10-CM | POA: Diagnosis not present

## 2018-06-02 DIAGNOSIS — G4700 Insomnia, unspecified: Secondary | ICD-10-CM | POA: Diagnosis not present

## 2018-06-02 DIAGNOSIS — I10 Essential (primary) hypertension: Secondary | ICD-10-CM | POA: Diagnosis not present

## 2018-06-02 DIAGNOSIS — L290 Pruritus ani: Secondary | ICD-10-CM | POA: Diagnosis not present

## 2018-06-13 ENCOUNTER — Ambulatory Visit (HOSPITAL_COMMUNITY)
Admission: RE | Admit: 2018-06-13 | Discharge: 2018-06-13 | Disposition: A | Discharge status: Home / Self Care | Payer: Medicare HMO | Source: Ambulatory Visit | Attending: Internal Medicine | Admitting: Internal Medicine

## 2018-06-13 DIAGNOSIS — Z1231 Encounter for screening mammogram for malignant neoplasm of breast: Secondary | ICD-10-CM

## 2018-07-07 ENCOUNTER — Ambulatory Visit (INDEPENDENT_AMBULATORY_CARE_PROVIDER_SITE_OTHER): Payer: Medicare HMO | Admitting: Orthopedic Surgery

## 2018-07-07 ENCOUNTER — Ambulatory Visit (INDEPENDENT_AMBULATORY_CARE_PROVIDER_SITE_OTHER): Payer: Medicare HMO

## 2018-07-07 ENCOUNTER — Encounter (INDEPENDENT_AMBULATORY_CARE_PROVIDER_SITE_OTHER): Payer: Self-pay | Admitting: Orthopedic Surgery

## 2018-07-07 VITALS — Ht 67.0 in | Wt 162.0 lb

## 2018-07-07 DIAGNOSIS — M79671 Pain in right foot: Secondary | ICD-10-CM

## 2018-07-07 DIAGNOSIS — M205X1 Other deformities of toe(s) (acquired), right foot: Secondary | ICD-10-CM

## 2018-07-09 ENCOUNTER — Encounter (INDEPENDENT_AMBULATORY_CARE_PROVIDER_SITE_OTHER): Payer: Self-pay | Admitting: Orthopedic Surgery

## 2018-07-09 NOTE — Progress Notes (Signed)
Office Visit Note   Patient: Bethany Grant           Date of Birth: May 22, 1941           MRN: [id number] Visit Date: 07/07/2018              Requested by: Celene Squibb, MD [place], [place] [address] PCP: Celene Squibb, MD  Chief Complaint  Patient presents with  . Right Foot - Pain      HPI: Patient is a 77 year old woman with rheumatoid arthritis who complains of increasing pain across the forefoot on the right with fixed clawing of her toes.  She has developed ulcers from the shoewear rubbing on the toes.  Patient states that her left foot surgery has done well for the claw toe deformity.  Assessment & Plan: Visit Diagnoses:  1. Pain in right foot   2. Claw toe, acquired, right     Plan: Due to failure of conservative care patient states she would like to proceed with surgical intervention for the right foot.  This would require a Weil osteotomy for the second third and fourth metatarsals with a PIP resection of the second third and fourth toes possible tendon reconstruction for the fifth toe.  Risks and benefits were discussed including persistent pain infection need for additional surgery.  Patient states she understands wished to proceed at this time  Patient states she has had a history of nausea and vomiting from anesthetic medications and most likely will require surgery with a regional block.  Follow-Up Instructions: Return in about 1 week (around 07/14/2018).   Ortho Exam  Patient is alert, oriented, no adenopathy, well-dressed, normal affect, normal respiratory effort. Examination patient has a good dorsalis pedis pulse.  She has fixed clawing of toes 2 3 and 4 prominent metatarsal heads which are tender to palpation.  There is overlapping and valgus alignment to the second third and fourth toes with the toes resting on top of the fifth toe.  Imaging: No results found. No images are attached to the encounter.  Labs: Lab Results  Component  Value Date   REPTSTATUS 05/12/2008 FINAL 05/07/2008   REPTSTATUS 05/07/2008 FINAL 05/07/2008   GRAMSTAIN  05/07/2008    RARE WBC PRESENT, PREDOMINANTLY MONONUCLEAR NO ORGANISMS SEEN NO ORGANISMS SEEN   CULT NO LEGIONELLA ISOLATED 05/07/2008   LABORGA No Herpes Simplex Virus detected. 03/25/2016     No results found for: ALBUMIN, PREALBUMIN, LABURIC  Body mass index is 25.37 kg/m.  Orders:  Orders Placed This Encounter  Procedures  . XR Foot 2 Views Right   No orders of the defined types were placed in this encounter.    Procedures: No procedures performed  Clinical Data: No additional findings.  ROS:  All other systems negative, except as noted in the HPI. Review of Systems  Objective: Vital Signs: Ht 5\' 7"  (1.702 m)   Wt 162 lb (73.5 kg)   LMP  (LMP Unknown)   BMI 25.37 kg/m   Specialty Comments:  No specialty comments available.  PMFS History: Patient Active Problem List   Diagnosis Date Noted  . Cough 04/21/2018  . Bronchitis 04/21/2018  . Abnormal EKG 12/20/2014  . Other fatigue 12/20/2014  . RBBB 12/20/2013  . LAFB (left anterior fascicular block) 12/20/2013  . Bifascicular block 12/20/2013  . RA (rheumatoid arthritis) ([place]) 12/20/2013  . Rheumatoid lung disease ([place]) 10/12/2013  . Osteopenia 06/14/2013  . Hypertension   . RHINITIS 02/25/2009  .  ARTHRITIS, RHEUMATOID 05/03/2008   Past Medical History:  Diagnosis Date  . Hypercholesterolemia   . Hypertension   . Osteopenia 09/2017   T score -2.1 FRAX 18% / 2.7%  . Rheumatoid arthritis(714.0)     Family History  Problem Relation Age of Onset  . Cancer Mother        LUNG  . Hypertension Sister   . Cancer Sister        LUNG, THYROID, UTERINE  . Cancer Brother        LUNG  . Cancer Sister        Lung cancer  . Colon cancer Neg Hx     Past Surgical History:  Procedure Laterality Date  . ABDOMINAL HYSTERECTOMY  1991   TAH/BSO  . BARTHOLIN GLAND CYST EXCISION  1980   RIGHT  .  BREAST SURGERY  1988   BREAST CYST  . COLONOSCOPY    . COLONOSCOPY N/A 08/24/2013   Procedure: COLONOSCOPY;  Surgeon: Rogene Houston, MD;  [facility];  Service: Endoscopy;  Laterality: N/A;  100  . FOOT SURGERY     2 TIMES  . FOOT SURGERY Left   . HAND SURGERY    . HEMORRHOID SURGERY     been over 20 years ago  . OLECRANON BURSECTOMY Right 11/29/2013   Procedure: EXCISION OLECRANON BURSA RIGHT ;  Surgeon: Wynonia Sours, MD;  Location: [facility];  Service: Orthopedics;  Laterality: Right;  . OOPHORECTOMY     BSO  . TENOSYNOVECTOMY Right 11/29/2013   Procedure: TENOSYNOVECTOMY FLEXOR TENDON RIGHT WRIST ;  Surgeon: Wynonia Sours, MD;  Location: [facility];  Service: Orthopedics;  Laterality: Right;   Social History   Occupational History  . Not on file  Tobacco Use  . Smoking status: Former Smoker    Packs/day: 0.80    Years: 30.00    Pack years: 24.00    Types: Cigarettes    Last attempt to quit: 07/27/2004    Years since quitting: 13.9  . Smokeless tobacco: Never Used  Substance and Sexual Activity  . Alcohol use: No    Alcohol/week: 0.0 standard drinks  . Drug use: No  . Sexual activity: Not Currently    Birth control/protection: Surgical    Comment: 1st intercourse 77 yo-Fewer than 5 partners

## 2018-07-13 DIAGNOSIS — M0579 Rheumatoid arthritis with rheumatoid factor of multiple sites without organ or systems involvement: Secondary | ICD-10-CM | POA: Diagnosis not present

## 2018-08-01 ENCOUNTER — Ambulatory Visit (INDEPENDENT_AMBULATORY_CARE_PROVIDER_SITE_OTHER): Payer: Self-pay | Admitting: Physician Assistant

## 2018-08-05 NOTE — Pre-Procedure Instructions (Signed)
Bethany Grant  08/05/2018      [id number] [address], [place] [address] AT [address] [place] [address] Phone: [phone] Fax: [phone]  [facility] [place], [place] [address] Phone: [phone] Fax: [phone]  [id number] - [place], [place] - [address] AT [address] [place] [address] Phone: [phone] Fax: [phone]    Your procedure is scheduled on January 15th.  Report to [facility] Admitting at 6:30 A.M.  Call this number if you have problems the morning of surgery:  [phone]   Remember:  Do not eat or drink after midnight.     Take these medicines the morning of surgery with A SIP OF WATER   Xanax  Eye drops - if needed  Nasal Spray - if needed  Nitroglycerin - if needed  7 days prior to surgery STOP taking any Aspirin (unless otherwise instructed by your surgeon), Aleve, Naproxen, Ibuprofen, Motrin, Advil, Goody's, BC's, all herbal medications, fish oil, and all vitamins.      Do not wear jewelry, make-up or nail polish.  Do not wear lotions, powders, or perfumes, or deodorant.  Do not shave 48 hours prior to surgery.   Do not bring valuables to the hospital.  [facility] is not responsible for any belongings or valuables.   [facility]- Preparing For Surgery  Before surgery, you can play an important role. Because skin is not sterile, your skin needs to be as free of germs as possible. You can reduce the number of germs on your skin by washing with CHG (chlorahexidine gluconate) Soap before surgery.  CHG is an antiseptic cleaner which kills germs and bonds with the skin to continue killing germs even after washing.    Oral Hygiene is also important to reduce your risk of infection.  Remember - BRUSH YOUR TEETH THE MORNING OF  SURGERY WITH YOUR REGULAR TOOTHPASTE  Please do not use if you have an allergy to CHG or antibacterial soaps. If your skin becomes reddened/irritated stop using the CHG.  Do not shave (including legs and underarms) for at least 48 hours prior to first CHG shower. It is OK to shave your face.  Please follow these instructions carefully.   1. Shower the NIGHT BEFORE SURGERY and the MORNING OF SURGERY with CHG.   2. If you chose to wash your hair, wash your hair first as usual with your normal shampoo.  3. After you shampoo, rinse your hair and body thoroughly to remove the shampoo.  4. Use CHG as you would any other liquid soap. You can apply CHG directly to the skin and wash gently with a scrungie or a clean washcloth.   5. Apply the CHG Soap to your body ONLY FROM THE NECK DOWN.  Do not use on open wounds or open sores. Avoid contact with your eyes, ears, mouth and genitals (private parts). Wash Face and genitals (private parts)  with your normal soap.  6. Wash thoroughly, paying special attention to the area where your surgery will be performed.  7. Thoroughly rinse your body with warm water from the neck down.  8. DO NOT shower/wash with your normal soap after using and rinsing off the CHG Soap.  9. Pat yourself dry with a CLEAN TOWEL.  10. Wear CLEAN PAJAMAS to bed the night before surgery, wear comfortable clothes the morning of surgery  11. Place CLEAN SHEETS on your bed the night of your first shower and DO NOT SLEEP WITH PETS.   Day of Surgery:  Do not apply any deodorants/lotions.  Please wear clean clothes to the hospital/surgery center.   Remember to brush your teeth WITH YOUR REGULAR TOOTHPASTE.   Contacts, dentures or bridgework may not be worn into surgery.  Leave your suitcase in the car.  After surgery it may be brought to your room.  For patients admitted to the hospital, discharge time will be determined by your treatment team.  Patients discharged the day of  surgery will not be allowed to drive home.

## 2018-08-08 ENCOUNTER — Encounter (HOSPITAL_COMMUNITY): Payer: Self-pay

## 2018-08-08 ENCOUNTER — Encounter (HOSPITAL_COMMUNITY)
Admission: RE | Admit: 2018-08-08 | Discharge: 2018-08-08 | Disposition: A | Discharge status: Home / Self Care | Payer: Medicare HMO | Source: Ambulatory Visit | Attending: Orthopedic Surgery | Admitting: Orthopedic Surgery

## 2018-08-08 ENCOUNTER — Other Ambulatory Visit: Payer: Self-pay

## 2018-08-08 ENCOUNTER — Telehealth (INDEPENDENT_AMBULATORY_CARE_PROVIDER_SITE_OTHER): Payer: Self-pay

## 2018-08-08 ENCOUNTER — Telehealth (INDEPENDENT_AMBULATORY_CARE_PROVIDER_SITE_OTHER): Payer: Self-pay | Admitting: Orthopedic Surgery

## 2018-08-08 DIAGNOSIS — I10 Essential (primary) hypertension: Secondary | ICD-10-CM

## 2018-08-08 DIAGNOSIS — Z01818 Encounter for other preprocedural examination: Secondary | ICD-10-CM

## 2018-08-08 DIAGNOSIS — R9431 Abnormal electrocardiogram [ECG] [EKG]: Secondary | ICD-10-CM

## 2018-08-08 DIAGNOSIS — Z87891 Personal history of nicotine dependence: Secondary | ICD-10-CM | POA: Diagnosis not present

## 2018-08-08 DIAGNOSIS — I452 Bifascicular block: Secondary | ICD-10-CM | POA: Insufficient documentation

## 2018-08-08 DIAGNOSIS — Z79899 Other long term (current) drug therapy: Secondary | ICD-10-CM | POA: Diagnosis not present

## 2018-08-08 DIAGNOSIS — M069 Rheumatoid arthritis, unspecified: Secondary | ICD-10-CM | POA: Diagnosis not present

## 2018-08-08 DIAGNOSIS — I4510 Unspecified right bundle-branch block: Secondary | ICD-10-CM

## 2018-08-08 DIAGNOSIS — Z791 Long term (current) use of non-steroidal anti-inflammatories (NSAID): Secondary | ICD-10-CM | POA: Diagnosis not present

## 2018-08-08 DIAGNOSIS — Q6689 Other  specified congenital deformities of feet: Secondary | ICD-10-CM | POA: Diagnosis not present

## 2018-08-08 DIAGNOSIS — E7800 Pure hypercholesterolemia, unspecified: Secondary | ICD-10-CM | POA: Diagnosis not present

## 2018-08-08 HISTORY — DX: Nausea with vomiting, unspecified: R11.2

## 2018-08-08 HISTORY — DX: Other specified postprocedural states: Z98.890

## 2018-08-08 LAB — BASIC METABOLIC PANEL WITH GFR
Anion gap: 6 (ref 5–15)
BUN: 18 mg/dL (ref 8–23)
CO2: 28 mmol/L (ref 22–32)
Calcium: 8.9 mg/dL (ref 8.9–10.3)
Chloride: 103 mmol/L (ref 98–111)
Creatinine, Ser: 1.29 mg/dL — ABNORMAL HIGH (ref 0.44–1.00)
GFR calc Af Amer: 47 mL/min — ABNORMAL LOW
GFR calc non Af Amer: 40 mL/min — ABNORMAL LOW
Glucose, Bld: 101 mg/dL — ABNORMAL HIGH (ref 70–99)
Potassium: 3.9 mmol/L (ref 3.5–5.1)
Sodium: 137 mmol/L (ref 135–145)

## 2018-08-08 LAB — CBC
HCT: 39.7 % (ref 36.0–46.0)
Hemoglobin: 12.7 g/dL (ref 12.0–15.0)
MCH: 30.7 pg (ref 26.0–34.0)
MCHC: 32 g/dL (ref 30.0–36.0)
MCV: 95.9 fL (ref 80.0–100.0)
NRBC: 0 % (ref 0.0–0.2)
Platelets: 163 10*3/uL (ref 150–400)
RBC: 4.14 MIL/uL (ref 3.87–5.11)
RDW: 14.1 % (ref 11.5–15.5)
WBC: 4.5 10*3/uL (ref 4.0–10.5)

## 2018-08-08 NOTE — Telephone Encounter (Signed)
I tried to call patient about her scooter. If she calls back,  she can be informed that she can get a Rx for knee scooter and she could possibly rent one from pharmacy; she just needs to call them. Her Rx is ready for pickup.

## 2018-08-08 NOTE — Progress Notes (Signed)
PCP - Dr. Allyn Kenner Cardiologist - Dr. Candee Furbish  Chest x-ray - 04/21/18 EKG - 08/08/18 Stress Test - 2018 ECHO - denies Cardiac Cath - denies  Sleep Study - denies  Aspirin Instructions: N/A  Labs: CBC, BMP  Anesthesia review: No  Patient denies shortness of breath, fever, cough and chest pain at PAT appointment   Patient verbalized understanding of instructions that were given to them at the PAT appointment. Patient was also instructed that they will need to review over the PAT instructions again at home before surgery.

## 2018-08-08 NOTE — Pre-Procedure Instructions (Signed)
Bethany Grant  08/08/2018      [id number] [address], [place] [address] AT [place] [address] Phone: [phone] Fax: [phone]  [facility] [place], [place] [address] Phone: [phone] Fax: [phone]  [id number] [place], [place] - [address] [place] [address] Phone: [phone] Fax: [phone]    Your procedure is scheduled on January 15th.  Report to [facility] Admitting at 6:30 A.M.  Call this number if you have problems the morning of surgery:  [phone]   Remember:  Do not eat or drink after midnight.     Take these medicines the morning of surgery with A SIP OF WATER   Alprazolam-Xanax  Eye drops - if needed  Nasal Spray - if needed  Nitroglycerin - if needed; let nurse know if you had to use this.   As of today, STOP taking any Aspirin (unless otherwise instructed by your surgeon), Aleve, Naproxen, Ibuprofen, Motrin, Advil, Goody's, BC's, all herbal medications, fish oil, and all vitamins.      Do not wear jewelry, make-up or nail polish.  Do not wear lotions, powders, or perfumes, or deodorant.  Do not shave 48 hours prior to surgery.   Do not bring valuables to the hospital.  [facility] is not responsible for any belongings or valuables.   [facility]- Preparing For Surgery  Before surgery, you can play an important role. Because skin is not sterile, your skin needs to be as free of germs as possible. You can reduce the number of germs on your skin by washing with CHG (chlorahexidine gluconate) Soap before surgery.  CHG is an antiseptic cleaner which kills germs and bonds with the skin to continue killing germs even after washing.    Oral Hygiene is also important to reduce your risk of infection.   Remember - BRUSH YOUR TEETH THE MORNING OF SURGERY WITH YOUR REGULAR TOOTHPASTE  Please do not use if you have an allergy to CHG or antibacterial soaps. If your skin becomes reddened/irritated stop using the CHG.  Do not shave (including legs and underarms) for at least 48 hours prior to first CHG shower. It is OK to shave your face.  Please follow these instructions carefully.   1. Shower the NIGHT BEFORE SURGERY and the MORNING OF SURGERY with CHG.   2. If you chose to wash your hair, wash your hair first as usual with your normal shampoo.  3. After you shampoo, rinse your hair and body thoroughly to remove the shampoo.  4. Use CHG as you would any other liquid soap. You can apply CHG directly to the skin and wash gently with a scrungie or a clean washcloth.   5. Apply the CHG Soap to your body ONLY FROM THE NECK DOWN.  Do not use on open wounds or open sores. Avoid contact with your eyes, ears, mouth and genitals (private parts). Wash Face and genitals (private parts)  with your normal soap.  6. Wash thoroughly, paying special attention to the area where your surgery will be performed.  7. Thoroughly rinse your body with warm water from the neck down.  8. DO NOT shower/wash with your normal soap after using and rinsing off the CHG Soap.  9. Pat yourself dry with a CLEAN TOWEL.  10. Wear CLEAN PAJAMAS to bed the night before surgery, wear comfortable clothes the morning of surgery  11. Place CLEAN SHEETS on your bed the night of your first shower and DO NOT SLEEP WITH PETS.   Day of Surgery:  Do not apply any deodorants/lotions.  Please wear clean clothes to the hospital/surgery center.   Remember to brush your teeth WITH YOUR REGULAR TOOTHPASTE.   Contacts, dentures or bridgework may not be worn into surgery.  Leave your suitcase in the car.  After surgery it may be brought to your room.  For patients admitted to the hospital, discharge time will be determined by your  treatment team.  Patients discharged the day of surgery will not be allowed to drive home.

## 2018-08-08 NOTE — Telephone Encounter (Signed)
Patient called asked if she will need a knee scooter after her surgery. The number to contact patient is [phone]

## 2018-08-09 NOTE — Anesthesia Preprocedure Evaluation (Addendum)
Anesthesia Evaluation  Patient identified by MRN, date of birth, ID band Patient awake    Reviewed: Allergy & Precautions, NPO status , Patient's Chart, lab work & pertinent test results  History of Anesthesia Complications (+) PONV and history of anesthetic complications  Airway Mallampati: II  TM Distance: >3 FB Neck ROM: Full    Dental  (+) Teeth Intact   Pulmonary former smoker,    Pulmonary exam normal breath sounds clear to auscultation       Cardiovascular hypertension, Pt. on medications Normal cardiovascular exam Rhythm:Regular Rate:Normal  Negative stress 5/18, EF 54%   EKG 08/08/18: LAFB, RBBB    Neuro/Psych Anxiety negative neurological ROS     GI/Hepatic negative GI ROS, Neg liver ROS,   Endo/Other  negative endocrine ROS  Renal/GU negative Renal ROS     Musculoskeletal  (+) Arthritis , Rheumatoid disorders,    Abdominal   Peds  Hematology negative hematology ROS (+)   Anesthesia Other Findings Day of surgery medications reviewed with the patient.  Reproductive/Obstetrics                            Anesthesia Physical Anesthesia Plan  ASA: II  Anesthesia Plan: Regional and MAC   Post-op Pain Management:    Induction:   PONV Risk Score and Plan: Treatment may vary due to age or medical condition, Propofol infusion, Midazolam and Ondansetron  Airway Management Planned: Natural Airway and Simple Face Mask  Additional Equipment:   Intra-op Plan:   Post-operative Plan:   Informed Consent: I have reviewed the patients History and Physical, chart, labs and discussed the procedure including the risks, benefits and alternatives for the proposed anesthesia with the patient or authorized representative who has indicated his/her understanding and acceptance.       Plan Discussed with: CRNA  Anesthesia Plan Comments: (Ankle block)       Anesthesia Quick  Evaluation

## 2018-08-10 ENCOUNTER — Encounter (HOSPITAL_COMMUNITY): Payer: Self-pay | Admitting: Certified Registered"

## 2018-08-10 ENCOUNTER — Ambulatory Visit (HOSPITAL_COMMUNITY): Payer: Medicare HMO | Admitting: Anesthesiology

## 2018-08-10 ENCOUNTER — Ambulatory Visit (HOSPITAL_COMMUNITY)
Admission: RE | Admit: 2018-08-10 | Discharge: 2018-08-10 | Disposition: A | Discharge status: Home / Self Care | Payer: Medicare HMO | Attending: Orthopedic Surgery | Admitting: Orthopedic Surgery

## 2018-08-10 ENCOUNTER — Other Ambulatory Visit: Payer: Self-pay

## 2018-08-10 ENCOUNTER — Encounter (HOSPITAL_COMMUNITY)
Admission: RE | Disposition: A | Discharge status: Home / Self Care | Payer: Self-pay | Source: Home / Self Care | Attending: Orthopedic Surgery

## 2018-08-10 DIAGNOSIS — Z87891 Personal history of nicotine dependence: Secondary | ICD-10-CM | POA: Insufficient documentation

## 2018-08-10 DIAGNOSIS — I10 Essential (primary) hypertension: Secondary | ICD-10-CM | POA: Insufficient documentation

## 2018-08-10 DIAGNOSIS — Z79899 Other long term (current) drug therapy: Secondary | ICD-10-CM | POA: Insufficient documentation

## 2018-08-10 DIAGNOSIS — Z791 Long term (current) use of non-steroidal anti-inflammatories (NSAID): Secondary | ICD-10-CM | POA: Diagnosis not present

## 2018-08-10 DIAGNOSIS — M069 Rheumatoid arthritis, unspecified: Secondary | ICD-10-CM | POA: Insufficient documentation

## 2018-08-10 DIAGNOSIS — E7800 Pure hypercholesterolemia, unspecified: Secondary | ICD-10-CM | POA: Insufficient documentation

## 2018-08-10 DIAGNOSIS — Q6689 Other  specified congenital deformities of feet: Secondary | ICD-10-CM | POA: Diagnosis not present

## 2018-08-10 DIAGNOSIS — M21531 Acquired clawfoot, right foot: Secondary | ICD-10-CM | POA: Diagnosis not present

## 2018-08-10 DIAGNOSIS — M205X1 Other deformities of toe(s) (acquired), right foot: Secondary | ICD-10-CM | POA: Diagnosis not present

## 2018-08-10 HISTORY — PX: WEIL OSTEOTOMY: SHX5044

## 2018-08-10 SURGERY — OSTEOTOMY, WEIL
Anesthesia: Monitor Anesthesia Care | Laterality: Right

## 2018-08-10 MED ORDER — FENTANYL CITRATE (PF) 250 MCG/5ML IJ SOLN
INTRAMUSCULAR | Status: AC
  Filled 2018-08-10: qty 5

## 2018-08-10 MED ORDER — LIDOCAINE 2% (20 MG/ML) 5 ML SYRINGE
INTRAMUSCULAR | Status: AC
  Filled 2018-08-10: qty 5

## 2018-08-10 MED ORDER — CEFAZOLIN SODIUM-DEXTROSE 2-4 GM/100ML-% IV SOLN
2.0000 g | INTRAVENOUS | Status: AC
  Administered 2018-08-10: 2000 mg via INTRAVENOUS

## 2018-08-10 MED ORDER — MIDAZOLAM HCL 2 MG/2ML IJ SOLN
INTRAMUSCULAR | Status: AC
  Filled 2018-08-10: qty 2

## 2018-08-10 MED ORDER — PROPOFOL 500 MG/50ML IV EMUL
INTRAVENOUS | Status: DC | PRN
  Administered 2018-08-10: 50 ug/kg/min via INTRAVENOUS

## 2018-08-10 MED ORDER — DEXAMETHASONE SODIUM PHOSPHATE 10 MG/ML IJ SOLN
INTRAMUSCULAR | Status: AC
  Filled 2018-08-10: qty 1

## 2018-08-10 MED ORDER — ONDANSETRON HCL 4 MG/2ML IJ SOLN
INTRAMUSCULAR | Status: DC | PRN
  Administered 2018-08-10: 4 mg via INTRAVENOUS

## 2018-08-10 MED ORDER — BUPIVACAINE HCL (PF) 0.5 % IJ SOLN
INTRAMUSCULAR | Status: DC | PRN
  Administered 2018-08-10: 30 mL

## 2018-08-10 MED ORDER — LACTATED RINGERS IV SOLN
INTRAVENOUS | Status: DC | PRN
  Administered 2018-08-10: 08:00:00 via INTRAVENOUS

## 2018-08-10 MED ORDER — PROPOFOL 10 MG/ML IV BOLUS
INTRAVENOUS | Status: AC
  Filled 2018-08-10: qty 20

## 2018-08-10 MED ORDER — DEXAMETHASONE SODIUM PHOSPHATE 10 MG/ML IJ SOLN
INTRAMUSCULAR | Status: DC | PRN
  Administered 2018-08-10: 10 mg via INTRAVENOUS

## 2018-08-10 MED ORDER — CEFAZOLIN SODIUM-DEXTROSE 2-4 GM/100ML-% IV SOLN
INTRAVENOUS | Status: AC
  Filled 2018-08-10: qty 100

## 2018-08-10 MED ORDER — 0.9 % SODIUM CHLORIDE (POUR BTL) OPTIME
TOPICAL | Status: DC | PRN
  Administered 2018-08-10: 1000 mL

## 2018-08-10 MED ORDER — MIDAZOLAM HCL 5 MG/5ML IJ SOLN
INTRAMUSCULAR | Status: DC | PRN
  Administered 2018-08-10: 1 mg via INTRAVENOUS

## 2018-08-10 MED ORDER — ONDANSETRON HCL 4 MG/2ML IJ SOLN
INTRAMUSCULAR | Status: AC
  Filled 2018-08-10: qty 2

## 2018-08-10 MED ORDER — CHLORHEXIDINE GLUCONATE 4 % EX LIQD: 60.0000 mL | Freq: Once | CUTANEOUS | Status: DC

## 2018-08-10 MED ORDER — FENTANYL CITRATE (PF) 100 MCG/2ML IJ SOLN
INTRAMUSCULAR | Status: DC | PRN
  Administered 2018-08-10 (×2): 25 ug via INTRAVENOUS

## 2018-08-10 MED ORDER — EPHEDRINE SULFATE-NACL 50-0.9 MG/10ML-% IV SOSY
PREFILLED_SYRINGE | INTRAVENOUS | Status: DC | PRN
  Administered 2018-08-10: 5 mg via INTRAVENOUS

## 2018-08-10 SURGICAL SUPPLY — 54 items
.045 double ended kwire ×3 IMPLANT
BIT DRILL 1.5 (BIT) IMPLANT
BLADE AVERAGE 25X9 (BLADE) ×2 IMPLANT
BLADE MINI RND TIP GREEN BEAV (BLADE) ×1 IMPLANT
BNDG CMPR 9X4 STRL LF SNTH (GAUZE/BANDAGES/DRESSINGS) ×1
BNDG COHESIVE 1X5 TAN STRL LF (GAUZE/BANDAGES/DRESSINGS) IMPLANT
BNDG COHESIVE 4X5 TAN STRL (GAUZE/BANDAGES/DRESSINGS) ×1 IMPLANT
BNDG COHESIVE 6X5 TAN STRL LF (GAUZE/BANDAGES/DRESSINGS) ×1 IMPLANT
BNDG ESMARK 4X9 LF (GAUZE/BANDAGES/DRESSINGS) ×2 IMPLANT
BNDG GAUZE ELAST 4 BULKY (GAUZE/BANDAGES/DRESSINGS) ×1 IMPLANT
BNDG GAUZE STRTCH 6 (GAUZE/BANDAGES/DRESSINGS) IMPLANT
CORDS BIPOLAR (ELECTRODE) ×2 IMPLANT
COTTON STERILE ROLL (GAUZE/BANDAGES/DRESSINGS) IMPLANT
COVER SURGICAL LIGHT HANDLE (MISCELLANEOUS) ×3 IMPLANT
COVER WAND RF STERILE (DRAPES) ×1 IMPLANT
CUFF TOURNIQUET SINGLE 18IN (TOURNIQUET CUFF) IMPLANT
CUFF TOURNIQUET SINGLE 24IN (TOURNIQUET CUFF) IMPLANT
DRAPE OEC MINIVIEW 54X84 (DRAPES) ×1 IMPLANT
DRAPE U-SHAPE 47X51 STRL (DRAPES) ×2 IMPLANT
DRILL BIT 1.5 (BIT) ×1
DRSG ADAPTIC 3X8 NADH LF (GAUZE/BANDAGES/DRESSINGS) ×1 IMPLANT
DURAPREP 26ML APPLICATOR (WOUND CARE) ×2 IMPLANT
ELECT REM PT RETURN 9FT ADLT (ELECTROSURGICAL) ×2
ELECTRODE REM PT RTRN 9FT ADLT (ELECTROSURGICAL) ×1 IMPLANT
GAUZE SPONGE 4X4 12PLY STRL (GAUZE/BANDAGES/DRESSINGS) ×1 IMPLANT
GAUZE SPONGE 4X4 12PLY STRL LF (GAUZE/BANDAGES/DRESSINGS) ×1 IMPLANT
GLOVE BIOGEL PI IND STRL 9 (GLOVE) ×1 IMPLANT
GLOVE BIOGEL PI INDICATOR 9 (GLOVE) ×1
GLOVE SURG ORTHO 9.0 STRL STRW (GLOVE) ×2 IMPLANT
GOWN STRL REUS W/ TWL XL LVL3 (GOWN DISPOSABLE) ×2 IMPLANT
GOWN STRL REUS W/TWL XL LVL3 (GOWN DISPOSABLE) ×4
KIT BASIN OR (CUSTOM PROCEDURE TRAY) ×2 IMPLANT
KIT TURNOVER KIT B (KITS) ×2 IMPLANT
MANIFOLD NEPTUNE II (INSTRUMENTS) ×2 IMPLANT
NDL HYPO 25GX1X1/2 BEV (NEEDLE) IMPLANT
NEEDLE HYPO 25GX1X1/2 BEV (NEEDLE) IMPLANT
NS IRRIG 1000ML POUR BTL (IV SOLUTION) ×2 IMPLANT
PACK ORTHO EXTREMITY (CUSTOM PROCEDURE TRAY) ×2 IMPLANT
PAD ARMBOARD 7.5X6 YLW CONV (MISCELLANEOUS) ×4 IMPLANT
PAD CAST 4YDX4 CTTN HI CHSV (CAST SUPPLIES) IMPLANT
PADDING CAST COTTON 4X4 STRL (CAST SUPPLIES)
SCREW CORT T6 10X2XST SLF RTN (Screw) IMPLANT
SCREW CORTEX 2.0X10 (Screw) ×2 IMPLANT
SCREW CORTEX SLFTPNG 12MM (Screw) ×3 IMPLANT
SPECIMEN JAR SMALL (MISCELLANEOUS) ×1 IMPLANT
SUCTION FRAZIER HANDLE 10FR (MISCELLANEOUS)
SUCTION TUBE FRAZIER 10FR DISP (MISCELLANEOUS) IMPLANT
SUT ETHILON 2 0 FS 18 (SUTURE) ×1 IMPLANT
SUT VIC AB 2-0 FS1 27 (SUTURE) ×1 IMPLANT
SYR CONTROL 10ML LL (SYRINGE) IMPLANT
TOWEL OR 17X24 6PK STRL BLUE (TOWEL DISPOSABLE) ×2 IMPLANT
TOWEL OR 17X26 10 PK STRL BLUE (TOWEL DISPOSABLE) ×2 IMPLANT
TUBE CONNECTING 12X1/4 (SUCTIONS) IMPLANT
WATER STERILE IRR 1000ML POUR (IV SOLUTION) ×1 IMPLANT

## 2018-08-10 NOTE — Progress Notes (Signed)
Orthopedic Tech Progress Note Patient Details:  Bethany Grant [birth year] [id number]  Ortho Devices Type of Ortho Device: Prafo boot/shoe Ortho Device/Splint Interventions: Application   Post Interventions Patient Tolerated: Well   [place] Pink 08/10/2018, 11:32 AM

## 2018-08-10 NOTE — Anesthesia Procedure Notes (Signed)
Anesthesia Regional Block: Ankle block   Pre-Anesthetic Checklist: ,, timeout performed, Correct Patient, Correct Site, Correct Laterality, Correct Procedure, Correct Position, site marked, Risks and benefits discussed, pre-op evaluation,  At surgeon's request and post-op pain management  Laterality: Right  Prep: Maximum Sterile Barrier Precautions used, chloraprep       Needles:  Injection technique: Single-shot  Needle Type: Echogenic Needle     Needle Length: 4cm  Needle Gauge: 25     Additional Needles:   (landmark technique)  Narrative:  Start time: 08/10/2018 8:01 AM End time: 08/10/2018 8:05 AM Injection made incrementally with aspirations every 5 mL.  Performed by: Personally  Anesthesiologist: Brennan Bailey, MD  Additional Notes: Risks, benefits, and alternative discussed. Patient gave consent for procedure. Patient prepped and draped in sterile fashion. Sedation administered, patient remains easily responsive to voice. Local anesthetic given in 5cc increments with no signs or symptoms of intravascular injection. No pain or paraesthesias with injection. Patient monitored throughout procedure with signs of LAST or immediate complications. Tolerated well.  Tawny Asal, MD

## 2018-08-10 NOTE — Discharge Instructions (Signed)

## 2018-08-10 NOTE — H&P (Signed)
Bethany Grant is an 78 y.o. female.   Chief Complaint: right foot pain and deformity HPI: Patient is a 78 year old woman with rheumatoid arthritis who complains of increasing pain across the forefoot on the right with fixed clawing of her toes.  She has developed ulcers from the shoewear rubbing on the toes.  Patient states that her left foot surgery has done well for the claw toe deformity.  Past Medical History:  Diagnosis Date  . Hypercholesterolemia   . Hypertension   . Osteopenia 09/2017   T score -2.1 FRAX 18% / 2.7%  . PONV (postoperative nausea and vomiting)   . Rheumatoid arthritis(714.0)     Past Surgical History:  Procedure Laterality Date  . ABDOMINAL HYSTERECTOMY  1991   TAH/BSO  . BARTHOLIN GLAND CYST EXCISION  1980   RIGHT  . BREAST SURGERY  1988   BREAST CYST  . COLONOSCOPY    . COLONOSCOPY N/A 08/24/2013   Procedure: COLONOSCOPY;  Surgeon: Rogene Houston, MD;  [facility];  Service: Endoscopy;  Laterality: N/A;  100  . FOOT SURGERY     2 TIMES  . FOOT SURGERY Left   . HAND SURGERY    . HEMORRHOID SURGERY     been over 20 years ago  . OLECRANON BURSECTOMY Right 11/29/2013   Procedure: EXCISION OLECRANON BURSA RIGHT ;  Surgeon: Wynonia Sours, MD;  Location: [facility];  Service: Orthopedics;  Laterality: Right;  . OOPHORECTOMY     BSO  . TENOSYNOVECTOMY Right 11/29/2013   Procedure: TENOSYNOVECTOMY FLEXOR TENDON RIGHT WRIST ;  Surgeon: Wynonia Sours, MD;  Location: [facility];  Service: Orthopedics;  Laterality: Right;    Family History  Problem Relation Age of Onset  . Cancer Mother        LUNG  . Hypertension Sister   . Cancer Sister        LUNG, THYROID, UTERINE  . Cancer Brother        LUNG  . Cancer Sister        Lung cancer  . Colon cancer Neg Hx    Social History:  reports that she quit smoking about 14 years ago. Her smoking use included cigarettes. She has a 24.00 pack-year smoking history. She has never  used smokeless tobacco. She reports that she does not drink alcohol or use drugs.  Allergies:  Allergies  Allergen Reactions  . Other Nausea And Vomiting and Other (See Comments)    Doesn't do well with pain  Medications, sensitivity to tape    Medications Prior to Admission  Medication Sig Dispense Refill  . ALPRAZolam (XANAX) 0.25 MG tablet Take 0.25 mg by mouth at bedtime.     . Cholecalciferol (VITAMIN D3) 25 MCG (1000 UT) CAPS Take 1,000 Units by mouth daily.    . cycloSPORINE (RESTASIS) 0.05 % ophthalmic emulsion Place 1 drop into both eyes 2 (two) times daily.     . fluticasone (KLS ALLER-FLO) 50 MCG/ACT nasal spray Place 1 spray into both nostrils daily as needed for allergies or rhinitis.    . folic acid (FOLVITE) 1 MG tablet Take 1 mg by mouth daily.    . hydroxychloroquine (PLAQUENIL) 200 MG tablet Take 400 mg by mouth daily.     Marland Kitchen ibuprofen (ADVIL,MOTRIN) 200 MG tablet Take 200 mg by mouth every 6 (six) hours as needed for headache or moderate pain.    Marland Kitchen losartan-hydrochlorothiazide (HYZAAR) 100-12.5 MG per tablet Take 1 tablet  by mouth daily.    . methotrexate (RHEUMATREX) 2.5 MG tablet Take 7.5 mg by mouth See admin instructions. Take 7.5 mg by mouth daily on Friday and take 7.5 mg by mouth daily on Saturday.    . nitroGLYCERIN (NITROSTAT) 0.4 MG SL tablet Place 1 tablet (0.4 mg total) under the tongue every 5 (five) minutes as needed for chest pain. 25 tablet 3    Results for orders placed or performed during the hospital encounter of 08/08/18 (from the past 48 hour(s))  Basic metabolic panel     Status: Abnormal   Collection Time: 08/08/18  1:32 PM  Result Value Ref Range   Sodium 137 135 - 145 mmol/L   Potassium 3.9 3.5 - 5.1 mmol/L   Chloride 103 98 - 111 mmol/L   CO2 28 22 - 32 mmol/L   Glucose, Bld 101 (H) 70 - 99 mg/dL   BUN 18 8 - 23 mg/dL   Creatinine, Ser 1.29 (H) 0.44 - 1.00 mg/dL   Calcium 8.9 8.9 - 10.3 mg/dL   GFR calc non Af Amer 40 (L) >60 mL/min    GFR calc Af Amer 47 (L) >60 mL/min   Anion gap 6 5 - 15    Comment: Performed at [facility], [place] [address], [place], [place] [address]  CBC     Status: None   Collection Time: 08/08/18  1:32 PM  Result Value Ref Range   WBC 4.5 4.0 - 10.5 K/uL   RBC 4.14 3.87 - 5.11 MIL/uL   Hemoglobin 12.7 12.0 - 15.0 g/dL   HCT 39.7 36.0 - 46.0 %   MCV 95.9 80.0 - 100.0 fL   MCH 30.7 26.0 - 34.0 pg   MCHC 32.0 30.0 - 36.0 g/dL   RDW 14.1 11.5 - 15.5 %   Platelets 163 150 - 400 K/uL   nRBC 0.0 0.0 - 0.2 %    Comment: Performed at [facility], [place] [address], [place], [place] [address]   No results found.  Review of Systems  All other systems reviewed and are negative.   Blood pressure 137/62, pulse 67, temperature 97.7 F (36.5 C), temperature source Oral, resp. rate 20, height 5\' 8"  (1.727 m), weight 74.8 kg, SpO2 98 %. Physical Exam  Patient is alert, oriented, no adenopathy, well-dressed, normal affect, normal respiratory effort. Examination patient has a good dorsalis pedis pulse.  She has fixed clawing of toes 2 3 and 4 prominent metatarsal heads which are tender to palpation.  There is overlapping and valgus alignment to the second third and fourth toes with the toes resting on top of the fifth toe.  Assessment/Plan 1. Pain in right foot   2. Claw toe, acquired, right     Plan: Due to failure of conservative care patient states she would like to proceed with surgical intervention for the right foot.  This would require a Weil osteotomy for the second third and fourth metatarsals with a PIP resection of the second third and fourth toes possible tendon reconstruction for the fifth toe.  Risks and benefits were discussed including persistent pain infection need for additional surgery.  Patient states she understands wished to proceed at this time  Patient states she has had a history of nausea and vomiting from anesthetic medications and most likely will require surgery  with a regional block.   Newt Minion, MD 08/10/2018, 6:36 AM

## 2018-08-10 NOTE — Anesthesia Postprocedure Evaluation (Signed)
Anesthesia Post Note  Patient: Bethany Grant  Procedure(s) Performed: WEIL OSTEOTOMY 2, 3,4 METATARSAL RIGHT FOOT, PROXIMAL INTERPHALANGEAL RESECTION TOES 2, 3, 4 (Right )     Patient location during evaluation: PACU Anesthesia Type: Regional Level of consciousness: awake and alert Pain management: pain level controlled Vital Signs Assessment: post-procedure vital signs reviewed and stable Respiratory status: spontaneous breathing, nonlabored ventilation and respiratory function stable Cardiovascular status: blood pressure returned to baseline and stable Postop Assessment: no apparent nausea or vomiting Anesthetic complications: no    Last Vitals:  Vitals:   08/10/18 0949 08/10/18 0953  BP: (!) 146/57 97/82  Pulse: (!) 53 (!) 57  Resp: 11 15  Temp: 36.6 C 36.6 C  SpO2: 100% 100%    Last Pain:  Vitals:   08/10/18 1000  TempSrc:   PainSc: 0-No pain                 Brennan Bailey

## 2018-08-10 NOTE — Op Note (Signed)
08/10/2018  9:34 AM  PATIENT:  Bethany Grant    PRE-OPERATIVE DIAGNOSIS:  Claw Toes Right Foot  POST-OPERATIVE DIAGNOSIS:  Same  PROCEDURE:  WEIL OSTEOTOMY 2, 3,4 METATARSAL RIGHT FOOT, PROXIMAL INTERPHALANGEAL RESECTION TOES, with fusion of the PIP joints 2, 3, 4 C-arm fluoroscopy to verify alignment.  SURGEON:  Newt Minion, MD  PHYSICIAN ASSISTANT:None ANESTHESIA:   General  PREOPERATIVE INDICATIONS:  Bethany Grant is a  78 y.o. female with a diagnosis of Claw Toes Right Foot who failed conservative measures and elected for surgical management.    The risks benefits and alternatives were discussed with the patient preoperatively including but not limited to the risks of infection, bleeding, nerve injury, cardiopulmonary complications, the need for revision surgery, among others, and the patient was willing to proceed.  OPERATIVE IMPLANTS: 0.045 K wires x3 with 2 mm mini frag screws x3  @ENCIMAGES @  OPERATIVE FINDINGS: Soft osteoporotic bone.  OPERATIVE PROCEDURE: Patient brought the operating room after undergoing a regional block the right lower extremity was then prepped using DuraPrep draped into a sterile field a timeout was called.  She received preoperative antibiotics.  A dorsal incision was made centered over the third metatarsal.  Dissection was carried down to the second toe MTP joint first.  A Weil osteotomy was performed the second metatarsal metatarsal head was translated proximally overhanging bone was resected and this was stabilized by 2 x 12 mm mini frag screw.  The PIP joint was resected and the joint was straightened and stabilized with a 0.045 K wire.  Attention was then focused on the third metatarsal.  A Weil osteotomy was performed metatarsal head translated proximally stabilized with a 2 x 12 mm mini frag screw overhanging bone was resected and the PIP joint was resected through a separate incision and straightened and this PIP joint was then stabilized with a  0.045 K wire.  Attention was then focused on the fourth metatarsal.  A Weil osteotomy was performed the fourth metatarsal metatarsal head was translated proximally overhanging bone was resected and this was stabilized by 2 x 10 mm mini frag screw.  A separate incision was made over the PIP joint the joint was resected the toe was straightened and this was stabilized with a 0.045 K wire.  C-arm fluoroscopy verified alignment of all 3 joints.  The wound was irrigated with normal saline incision was closed using 2-0 nylon a sterile dressing was applied patient was taken the PACU in stable condition.   DISCHARGE PLANNING:  Antibiotic duration: Preoperative antibiotics  Weightbearing: Touchdown weightbearing for transfers  Pain medication: Patient requests no narcotics  Dressing care/ Wound VAC: Change dressing in office in follow-up  Ambulatory devices: Walker and kneeling scooter  Discharge to: Home.  Follow-up: In the office 1 week post operative.

## 2018-08-10 NOTE — Transfer of Care (Signed)
Immediate Anesthesia Transfer of Care Note  Patient: Bethany Grant  Procedure(s) Performed: WEIL OSTEOTOMY 2, 3,4 METATARSAL RIGHT FOOT, PROXIMAL INTERPHALANGEAL RESECTION TOES 2, 3, 4 (Right )  Patient Location: PACU  Anesthesia Type:MAC  Level of Consciousness: awake, alert  and oriented  Airway & Oxygen Therapy: Patient Spontanous Breathing and Patient connected to face mask oxygen  Post-op Assessment: Report given to RN and Post -op Vital signs reviewed and stable  Post vital signs: Reviewed and stable  Last Vitals:  Vitals Value Taken Time  BP 117/61 08/10/2018  9:34 AM  Temp 36.6 C 08/10/2018  9:34 AM  Pulse 54 08/10/2018  9:34 AM  Resp 12 08/10/2018  9:34 AM  SpO2 100 % 08/10/2018  9:34 AM    Last Pain:  Vitals:   08/10/18 0934  TempSrc:   PainSc: (P) 0-No pain      Patients Stated Pain Goal: 2 (67/34/19 3790)  Complications: No apparent anesthesia complications

## 2018-08-12 ENCOUNTER — Encounter (HOSPITAL_COMMUNITY): Payer: Self-pay | Admitting: Orthopedic Surgery

## 2018-08-16 ENCOUNTER — Encounter (INDEPENDENT_AMBULATORY_CARE_PROVIDER_SITE_OTHER): Payer: Self-pay | Admitting: Physician Assistant

## 2018-08-16 ENCOUNTER — Ambulatory Visit (INDEPENDENT_AMBULATORY_CARE_PROVIDER_SITE_OTHER): Payer: Medicare HMO | Admitting: Physician Assistant

## 2018-08-16 VITALS — Ht 68.0 in | Wt 165.0 lb

## 2018-08-16 DIAGNOSIS — M205X1 Other deformities of toe(s) (acquired), right foot: Secondary | ICD-10-CM

## 2018-08-16 MED ORDER — DOXYCYCLINE HYCLATE 100 MG PO CAPS: 100.0000 mg | ORAL_CAPSULE | Freq: Two times a day (BID) | ORAL | 1 refills | Status: DC

## 2018-08-16 NOTE — Addendum Note (Signed)
Addended by: Milas Gain on: 08/16/2018 10:25 AM   Modules accepted: Orders

## 2018-08-16 NOTE — Progress Notes (Signed)
Office Visit Note   Patient: Bethany Grant           Date of Birth: 27-Jan-1941           MRN: [id number] Visit Date: 08/16/2018              Requested by: Celene Squibb, MD [address] [place], [place] [address] PCP: Celene Squibb, MD  Chief Complaint  Patient presents with  . Right Foot - Routine Post Op      HPI: The patient is a 78 year old woman here with her husband who is seen for postoperative follow-up following well osteotomies of the right foot second third and fourth toes on 08/10/2018.  She did fall a couple of days ago but reports no injury.  She does not like to take any type of narcotic pain medication due to severe nausea and vomiting and has been taking only ibuprofen 200 mg several times daily and we discussed she can increase this up to 4 to 600 mg 3 times daily with her meals.  We also discussed elevation as she has not been keeping it elevated much.  She is utilizing a knee scooter and nonweightbearing.  Assessment & Plan: Visit Diagnoses:  1. Claw toe, acquired, right     Plan: We will begin doxycycline 100 mg p.o. twice daily times the next 7 days.  She is going to increase her ibuprofen to 400 to 600 mg 3 times daily with meals.  We discussed elevating the foot higher than the level of her heart.  She will continue the knee scooter for mobilization nonweightbearing over the right foot.  We will leave sutures intact and pins intact for another week.  She is going to follow-up next week or sooner should she have difficulties in the interim.  Follow-Up Instructions: Return in about 1 week (around 08/23/2018).   Ortho Exam  Patient is alert, oriented, no adenopathy, well-dressed, normal affect, normal respiratory effort. The right incision has some minimal bloody drainage.  Sutures are intact.  No signs of cellulitis of the foot.  There is some mild erythema about the third and fourth toe.  Pin sites are clean.  There is edema about the toes and foot into the  ankle.  There is bruising about the foot.    Imaging: No results found. No images are attached to the encounter.  Labs: Lab Results  Component Value Date   REPTSTATUS 05/12/2008 FINAL 05/07/2008   REPTSTATUS 05/07/2008 FINAL 05/07/2008   GRAMSTAIN  05/07/2008    RARE WBC PRESENT, PREDOMINANTLY MONONUCLEAR NO ORGANISMS SEEN NO ORGANISMS SEEN   CULT NO LEGIONELLA ISOLATED 05/07/2008   LABORGA No Herpes Simplex Virus detected. 03/25/2016     No results found for: ALBUMIN, PREALBUMIN, LABURIC  Body mass index is 25.09 kg/m.  Orders:  No orders of the defined types were placed in this encounter.  No orders of the defined types were placed in this encounter.    Procedures: No procedures performed  Clinical Data: No additional findings.  ROS:  All other systems negative, except as noted in the HPI. Review of Systems  Objective: Vital Signs: Ht 5\' 8"  (1.727 m)   Wt 165 lb (74.8 kg)   LMP  (LMP Unknown)   BMI 25.09 kg/m   Specialty Comments:  No specialty comments available.  PMFS History: Patient Active Problem List   Diagnosis Date Noted  . Claw toe, acquired, right   . Cough 04/21/2018  . Bronchitis  04/21/2018  . Abnormal EKG 12/20/2014  . Other fatigue 12/20/2014  . RBBB 12/20/2013  . LAFB (left anterior fascicular block) 12/20/2013  . Bifascicular block 12/20/2013  . RA (rheumatoid arthritis) ([place]) 12/20/2013  . Rheumatoid lung disease ([place]) 10/12/2013  . Osteopenia 06/14/2013  . Hypertension   . RHINITIS 02/25/2009  . ARTHRITIS, RHEUMATOID 05/03/2008   Past Medical History:  Diagnosis Date  . Hypercholesterolemia   . Hypertension   . Osteopenia 09/2017   T score -2.1 FRAX 18% / 2.7%  . PONV (postoperative nausea and vomiting)   . Rheumatoid arthritis(714.0)     Family History  Problem Relation Age of Onset  . Cancer Mother        LUNG  . Hypertension Sister   . Cancer Sister        LUNG, THYROID, UTERINE  . Cancer Brother         LUNG  . Cancer Sister        Lung cancer  . Colon cancer Neg Hx     Past Surgical History:  Procedure Laterality Date  . ABDOMINAL HYSTERECTOMY  1991   TAH/BSO  . BARTHOLIN GLAND CYST EXCISION  1980   RIGHT  . BREAST SURGERY  1988   BREAST CYST  . COLONOSCOPY    . COLONOSCOPY N/A 08/24/2013   Procedure: COLONOSCOPY;  Surgeon: Rogene Houston, MD;  [facility];  Service: Endoscopy;  Laterality: N/A;  100  . FOOT SURGERY     2 TIMES  . FOOT SURGERY Left   . HAND SURGERY    . HEMORRHOID SURGERY     been over 20 years ago  . OLECRANON BURSECTOMY Right 11/29/2013   Procedure: EXCISION OLECRANON BURSA RIGHT ;  Surgeon: Wynonia Sours, MD;  Location: [facility];  Service: Orthopedics;  Laterality: Right;  . OOPHORECTOMY     BSO  . TENOSYNOVECTOMY Right 11/29/2013   Procedure: TENOSYNOVECTOMY FLEXOR TENDON RIGHT WRIST ;  Surgeon: Wynonia Sours, MD;  Location: [facility];  Service: Orthopedics;  Laterality: Right;  . WEIL OSTEOTOMY Right 08/10/2018   Procedure: WEIL OSTEOTOMY 2, 3,4 METATARSAL RIGHT FOOT, PROXIMAL INTERPHALANGEAL RESECTION TOES 2, 3, 4;  Surgeon: Newt Minion, MD;  Location: [facility];  Service: Orthopedics;  Laterality: Right;   Social History   Occupational History  . Not on file  Tobacco Use  . Smoking status: Former Smoker    Packs/day: 0.80    Years: 30.00    Pack years: 24.00    Types: Cigarettes    Last attempt to quit: 07/27/2004    Years since quitting: 14.0  . Smokeless tobacco: Never Used  Substance and Sexual Activity  . Alcohol use: No    Alcohol/week: 0.0 standard drinks  . Drug use: No  . Sexual activity: Not Currently    Birth control/protection: Surgical    Comment: 1st intercourse 78 yo-Fewer than 5 partners

## 2018-08-22 ENCOUNTER — Ambulatory Visit (INDEPENDENT_AMBULATORY_CARE_PROVIDER_SITE_OTHER): Payer: Medicare HMO | Admitting: Physician Assistant

## 2018-08-22 ENCOUNTER — Encounter (INDEPENDENT_AMBULATORY_CARE_PROVIDER_SITE_OTHER): Payer: Self-pay | Admitting: Physician Assistant

## 2018-08-22 DIAGNOSIS — M205X1 Other deformities of toe(s) (acquired), right foot: Secondary | ICD-10-CM

## 2018-08-22 NOTE — Progress Notes (Addendum)
Office Visit Note   Patient: Bethany Grant           Date of Birth: [birth year]           MRN: [id number] Visit Date: 08/22/2018              Requested by: Celene Squibb, MD [address] [place], [place] [address] PCP: Celene Squibb, MD  Chief Complaint  Patient presents with  . Right Ankle - Follow-up      HPI: The patient is a 78 year old woman here with her husband for postoperative follow-up following Weil osteotomies of the right foot second third and fourth toes on 08/10/2018.  She has been utilizing a knee scooter to get around.  She is completing some doxycycline.  She still got some residual swelling.  She is trying to elevate as much as possible. Assessment & Plan: Visit Diagnoses:  1. Claw toe, acquired, right     Plan: The pins from the second third and fourth toes on the right foot were removed today as well as the sutures.  The patient will continue offloading and utilize her knee scooter.  She will elevate as much as possible.  She will utilize a silver compression sock.  She can shower and get the area wet and wash with Dial soap and water and then apply her compression sock.  Complete course of doxycycline.  She will follow-up in 1 week.  Follow-Up Instructions: Return in about 1 week (around 08/29/2018).   Ortho Exam  Patient is alert, oriented, no adenopathy, well-dressed, normal affect, normal respiratory effort. Pins and sutures were removed this visit.  There is no signs of cellulitis.  Her erythema has improved.  She has persistent edema about the foot and will have her utilize a compression stocking to help with this.  She has good pedal pulses.  There are no signs of infection.    Imaging: No results found. No images are attached to the encounter.  Labs: Lab Results  Component Value Date   REPTSTATUS 05/12/2008 FINAL 05/07/2008   REPTSTATUS 05/07/2008 FINAL 05/07/2008   GRAMSTAIN  05/07/2008    RARE WBC PRESENT, PREDOMINANTLY MONONUCLEAR NO  ORGANISMS SEEN NO ORGANISMS SEEN   CULT NO LEGIONELLA ISOLATED 05/07/2008   LABORGA No Herpes Simplex Virus detected. 03/25/2016     No results found for: ALBUMIN, PREALBUMIN, LABURIC  There is no height or weight on file to calculate BMI.  Orders:  No orders of the defined types were placed in this encounter.  No orders of the defined types were placed in this encounter.    Procedures: No procedures performed  Clinical Data: No additional findings.  ROS:  All other systems negative, except as noted in the HPI. Review of Systems  Objective: Vital Signs: LMP  (LMP Unknown)   Specialty Comments:  No specialty comments available.  PMFS History: Patient Active Problem List   Diagnosis Date Noted  . Claw toe, acquired, right   . Cough 04/21/2018  . Bronchitis 04/21/2018  . Abnormal EKG 12/20/2014  . Other fatigue 12/20/2014  . RBBB 12/20/2013  . LAFB (left anterior fascicular block) 12/20/2013  . Bifascicular block 12/20/2013  . RA (rheumatoid arthritis) ([place]) 12/20/2013  . Rheumatoid lung disease ([facility]) 10/12/2013  . Osteopenia 06/14/2013  . Hypertension   . RHINITIS 02/25/2009  . ARTHRITIS, RHEUMATOID 05/03/2008   Past Medical History:  Diagnosis Date  . Hypercholesterolemia   . Hypertension   . Osteopenia 09/2017  T score -2.1 FRAX 18% / 2.7%  . PONV (postoperative nausea and vomiting)   . Rheumatoid arthritis(714.0)     Family History  Problem Relation Age of Onset  . Cancer Mother        LUNG  . Hypertension Sister   . Cancer Sister        LUNG, THYROID, UTERINE  . Cancer Brother        LUNG  . Cancer Sister        Lung cancer  . Colon cancer Neg Hx     Past Surgical History:  Procedure Laterality Date  . ABDOMINAL HYSTERECTOMY  1991   TAH/BSO  . BARTHOLIN GLAND CYST EXCISION  1980   RIGHT  . BREAST SURGERY  1988   BREAST CYST  . COLONOSCOPY    . COLONOSCOPY N/A 08/24/2013   Procedure: COLONOSCOPY;  Surgeon: Rogene Houston, MD;   [facility];  Service: Endoscopy;  Laterality: N/A;  100  . FOOT SURGERY     2 TIMES  . FOOT SURGERY Left   . HAND SURGERY    . HEMORRHOID SURGERY     been over 20 years ago  . OLECRANON BURSECTOMY Right 11/29/2013   Procedure: EXCISION OLECRANON BURSA RIGHT ;  Surgeon: Wynonia Sours, MD;  Location: [facility];  Service: Orthopedics;  Laterality: Right;  . OOPHORECTOMY     BSO  . TENOSYNOVECTOMY Right 11/29/2013   Procedure: TENOSYNOVECTOMY FLEXOR TENDON RIGHT WRIST ;  Surgeon: Wynonia Sours, MD;  Location: [facility];  Service: Orthopedics;  Laterality: Right;  . WEIL OSTEOTOMY Right 08/10/2018   Procedure: WEIL OSTEOTOMY 2, 3,4 METATARSAL RIGHT FOOT, PROXIMAL INTERPHALANGEAL RESECTION TOES 2, 3, 4;  Surgeon: Newt Minion, MD;  Location: [facility];  Service: Orthopedics;  Laterality: Right;   Social History   Occupational History  . Not on file  Tobacco Use  . Smoking status: Former Smoker    Packs/day: 0.80    Years: 30.00    Pack years: 24.00    Types: Cigarettes    Last attempt to quit: 07/27/2004    Years since quitting: 14.0  . Smokeless tobacco: Never Used  Substance and Sexual Activity  . Alcohol use: No    Alcohol/week: 0.0 standard drinks  . Drug use: No  . Sexual activity: Not Currently    Birth control/protection: Surgical    Comment: 1st intercourse 78 yo-Fewer than 5 partners

## 2018-08-29 ENCOUNTER — Encounter (INDEPENDENT_AMBULATORY_CARE_PROVIDER_SITE_OTHER): Payer: Self-pay | Admitting: Physician Assistant

## 2018-08-29 ENCOUNTER — Ambulatory Visit (INDEPENDENT_AMBULATORY_CARE_PROVIDER_SITE_OTHER): Payer: Medicare HMO | Admitting: Physician Assistant

## 2018-08-29 VITALS — Ht 68.0 in | Wt 165.0 lb

## 2018-08-29 DIAGNOSIS — M205X1 Other deformities of toe(s) (acquired), right foot: Secondary | ICD-10-CM

## 2018-08-29 DIAGNOSIS — Z4789 Encounter for other orthopedic aftercare: Secondary | ICD-10-CM

## 2018-08-29 MED ORDER — SULFAMETHOXAZOLE-TRIMETHOPRIM 800-160 MG PO TABS: 1.0000 | ORAL_TABLET | Freq: Two times a day (BID) | ORAL | 0 refills | Status: DC

## 2018-08-29 NOTE — Progress Notes (Signed)
Office Visit Note   Patient: Bethany Grant           Date of Birth: [birth year]           MRN: [id number] Visit Date: 08/29/2018              Requested by: Celene Squibb, MD [place], [place] [address] PCP: Celene Squibb, MD  Chief Complaint  Patient presents with  . Right Foot - Routine Post Op    08/10/2018 right foot weil osteotomies 2nd, 3rd and 4th toes.       HPI: The patient is a 78 year old woman who is here with her husband for postoperative follow-up following a Weil osteotomies of the right foot second, third, and fourth toes on 08/10/2018.  She has been utilizing a knee scooter and nonweightbearing.  She is continued to have some peri-incisional erythema and notes some mild drainage.  She is on Plaquenil and methotrexate and reports that she has had a history of poor wound healing.  Assessment & Plan: Visit Diagnoses:  1. Claw toe, acquired, right     Plan: Begin Septra DS 1 p.o. twice daily for the next 14 days.  She should continue nonweightbearing and utilizing her knee scooter.  She will follow-up in 1 week.  Follow-Up Instructions: Return in about 1 week (around 09/05/2018).   Ortho Exam  Patient is alert, oriented, no adenopathy, well-dressed, normal affect, normal respiratory effort. She has some peri-incisional blistering and old bloody drainage from the incisional and blistered areas.  There is also some superficial epidermal lysis about the medial central incision area.  There is continued edema and erythema.  She has palpable pedal pulse.  Imaging: No results found. No images are attached to the encounter.  Labs: Lab Results  Component Value Date   REPTSTATUS 05/12/2008 FINAL 05/07/2008   REPTSTATUS 05/07/2008 FINAL 05/07/2008   GRAMSTAIN  05/07/2008    RARE WBC PRESENT, PREDOMINANTLY MONONUCLEAR NO ORGANISMS SEEN NO ORGANISMS SEEN   CULT NO LEGIONELLA ISOLATED 05/07/2008   LABORGA No Herpes Simplex Virus detected. 03/25/2016      No results found for: ALBUMIN, PREALBUMIN, LABURIC  Body mass index is 25.09 kg/m.  Orders:  No orders of the defined types were placed in this encounter.  Meds ordered this encounter  Medications  . sulfamethoxazole-trimethoprim (BACTRIM DS,SEPTRA DS) 800-160 MG tablet    Sig: Take 1 tablet by mouth 2 (two) times daily.    Dispense:  20 tablet    Refill:  0     Procedures: No procedures performed  Clinical Data: No additional findings.  ROS:  All other systems negative, except as noted in the HPI. Review of Systems  Objective: Vital Signs: Ht 5\' 8"  (1.727 m)   Wt 165 lb (74.8 kg)   LMP  (LMP Unknown)   BMI 25.09 kg/m   Specialty Comments:  No specialty comments available.  PMFS History: Patient Active Problem List   Diagnosis Date Noted  . Claw toe, acquired, right   . Cough 04/21/2018  . Bronchitis 04/21/2018  . Abnormal EKG 12/20/2014  . Other fatigue 12/20/2014  . RBBB 12/20/2013  . LAFB (left anterior fascicular block) 12/20/2013  . Bifascicular block 12/20/2013  . RA (rheumatoid arthritis) ([facility]) 12/20/2013  . Rheumatoid lung disease ([facility]) 10/12/2013  . Osteopenia 06/14/2013  . Hypertension   . RHINITIS 02/25/2009  . ARTHRITIS, RHEUMATOID 05/03/2008   Past Medical History:  Diagnosis Date  . Hypercholesterolemia   .  Hypertension   . Osteopenia 09/2017   T score -2.1 FRAX 18% / 2.7%  . PONV (postoperative nausea and vomiting)   . Rheumatoid arthritis(714.0)     Family History  Problem Relation Age of Onset  . Cancer Mother        LUNG  . Hypertension Sister   . Cancer Sister        LUNG, THYROID, UTERINE  . Cancer Brother        LUNG  . Cancer Sister        Lung cancer  . Colon cancer Neg Hx     Past Surgical History:  Procedure Laterality Date  . ABDOMINAL HYSTERECTOMY  1991   TAH/BSO  . BARTHOLIN GLAND CYST EXCISION  1980   RIGHT  . BREAST SURGERY  1988   BREAST CYST  . COLONOSCOPY    . COLONOSCOPY N/A 08/24/2013    Procedure: COLONOSCOPY;  Surgeon: Rogene Houston, MD;  [facility];  Service: Endoscopy;  Laterality: N/A;  100  . FOOT SURGERY     2 TIMES  . FOOT SURGERY Left   . HAND SURGERY    . HEMORRHOID SURGERY     been over 20 years ago  . OLECRANON BURSECTOMY Right 11/29/2013   Procedure: EXCISION OLECRANON BURSA RIGHT ;  Surgeon: Wynonia Sours, MD;  Location: [facility];  Service: Orthopedics;  Laterality: Right;  . OOPHORECTOMY     BSO  . TENOSYNOVECTOMY Right 11/29/2013   Procedure: TENOSYNOVECTOMY FLEXOR TENDON RIGHT WRIST ;  Surgeon: Wynonia Sours, MD;  Location: [facility];  Service: Orthopedics;  Laterality: Right;  . WEIL OSTEOTOMY Right 08/10/2018   Procedure: WEIL OSTEOTOMY 2, 3,4 METATARSAL RIGHT FOOT, PROXIMAL INTERPHALANGEAL RESECTION TOES 2, 3, 4;  Surgeon: Newt Minion, MD;  Location: [facility];  Service: Orthopedics;  Laterality: Right;   Social History   Occupational History  . Not on file  Tobacco Use  . Smoking status: Former Smoker    Packs/day: 0.80    Years: 30.00    Pack years: 24.00    Types: Cigarettes    Last attempt to quit: 07/27/2004    Years since quitting: 14.1  . Smokeless tobacco: Never Used  Substance and Sexual Activity  . Alcohol use: No    Alcohol/week: 0.0 standard drinks  . Drug use: No  . Sexual activity: Not Currently    Birth control/protection: Surgical    Comment: 1st intercourse 78 yo-Fewer than 5 partners

## 2018-08-31 ENCOUNTER — Encounter (INDEPENDENT_AMBULATORY_CARE_PROVIDER_SITE_OTHER): Payer: Self-pay | Admitting: Physician Assistant

## 2018-09-02 ENCOUNTER — Telehealth (INDEPENDENT_AMBULATORY_CARE_PROVIDER_SITE_OTHER): Payer: Self-pay

## 2018-09-02 ENCOUNTER — Telehealth (INDEPENDENT_AMBULATORY_CARE_PROVIDER_SITE_OTHER): Payer: Self-pay | Admitting: Physician Assistant

## 2018-09-02 ENCOUNTER — Telehealth (INDEPENDENT_AMBULATORY_CARE_PROVIDER_SITE_OTHER): Payer: Self-pay | Admitting: Orthopedic Surgery

## 2018-09-02 ENCOUNTER — Other Ambulatory Visit (INDEPENDENT_AMBULATORY_CARE_PROVIDER_SITE_OTHER): Payer: Self-pay | Admitting: Physician Assistant

## 2018-09-02 DIAGNOSIS — M205X1 Other deformities of toe(s) (acquired), right foot: Secondary | ICD-10-CM

## 2018-09-02 MED ORDER — MUPIROCIN 2 % EX OINT: 1.0000 "application " | TOPICAL_OINTMENT | Freq: Every day | CUTANEOUS | 0 refills | Status: DC

## 2018-09-02 NOTE — Telephone Encounter (Signed)
Done.  Appt scheduled

## 2018-09-02 NOTE — Telephone Encounter (Signed)
Pt left lvm in regards to her foot surgery that she recently had, said two toes on her left foot are black and has some concerns about it.  [phone]

## 2018-09-02 NOTE — Telephone Encounter (Signed)
I called patient about her concerns and rescheduled her to come in to see Dr Sharol Given on Tues at 1245.

## 2018-09-06 ENCOUNTER — Telehealth (INDEPENDENT_AMBULATORY_CARE_PROVIDER_SITE_OTHER): Payer: Self-pay | Admitting: Orthopedic Surgery

## 2018-09-06 ENCOUNTER — Ambulatory Visit (INDEPENDENT_AMBULATORY_CARE_PROVIDER_SITE_OTHER): Payer: Self-pay | Admitting: Physician Assistant

## 2018-09-06 ENCOUNTER — Ambulatory Visit (INDEPENDENT_AMBULATORY_CARE_PROVIDER_SITE_OTHER): Payer: Medicare HMO | Admitting: Physician Assistant

## 2018-09-06 ENCOUNTER — Encounter (INDEPENDENT_AMBULATORY_CARE_PROVIDER_SITE_OTHER): Payer: Self-pay | Admitting: Physician Assistant

## 2018-09-06 VITALS — Ht 68.0 in | Wt 165.0 lb

## 2018-09-06 DIAGNOSIS — M205X1 Other deformities of toe(s) (acquired), right foot: Secondary | ICD-10-CM

## 2018-09-06 MED ORDER — SULFAMETHOXAZOLE-TRIMETHOPRIM 800-160 MG PO TABS: 1.0000 | ORAL_TABLET | Freq: Two times a day (BID) | ORAL | 0 refills | Status: DC

## 2018-09-06 NOTE — Progress Notes (Signed)
Office Visit Note   Patient: Bethany Grant           Date of Birth: Jul 12, 1941           MRN: [id number] Visit Date: 09/06/2018              Requested by: Celene Squibb, MD [place], [place] [address] PCP: Celene Squibb, MD  Chief Complaint  Patient presents with  . Right Foot - Routine Post Op      HPI: The patient is a 78 year old woman here with her husband for postoperative follow-up following Weil osteotomies of the right foot second third and fourth toes on 08/10/2018.  She is utilizing a knee scooter and nonweightbearing.  Her peri-incisional erythema, epidermal lysis and edema have improved.  She is continued on Septra DS twice daily.  She has been using mupirocin ointment to the incisional and blistered areas.  She has been staying off of her methotrexate for now and not having excessive symptoms as yet.  She is remained off of these due to her history of difficulty healing wounds.  Assessment & Plan: Visit Diagnoses:  1. Claw toe, acquired, right     Plan: Can have her continue on Septra DS 1 p.o. twice daily.  Continue mupirocin ointment to the incisional areas and blistered areas.  Continue knee scooter and offloading as much as possible.  Continue off of methotrexate for now.  She will follow-up in 1 week.  Follow-Up Instructions: Return in about 1 week (around 09/13/2018).   Ortho Exam  Patient is alert, oriented, no adenopathy, well-dressed, normal affect, normal respiratory effort. The patient's right foot incisions are improving with decreased peri-incisional blistering.  The ulcer over the tuft of the third toe and dorsum of the fourth toe are ruptured and there is decreased erythema over these areas as well.  The edema is improved.  She has good pedal pulses.  The epidermal lysis over the peri-incisional area is also improving.    Imaging: No results found. No images are attached to the encounter.  Labs: Lab Results  Component Value Date   REPTSTATUS 05/12/2008 FINAL 05/07/2008   REPTSTATUS 05/07/2008 FINAL 05/07/2008   GRAMSTAIN  05/07/2008    RARE WBC PRESENT, PREDOMINANTLY MONONUCLEAR NO ORGANISMS SEEN NO ORGANISMS SEEN   CULT NO LEGIONELLA ISOLATED 05/07/2008   LABORGA No Herpes Simplex Virus detected. 03/25/2016     No results found for: ALBUMIN, PREALBUMIN, LABURIC  Body mass index is 25.09 kg/m.  Orders:  No orders of the defined types were placed in this encounter.  Meds ordered this encounter  Medications  . sulfamethoxazole-trimethoprim (BACTRIM DS,SEPTRA DS) 800-160 MG tablet    Sig: Take 1 tablet by mouth 2 (two) times daily.    Dispense:  20 tablet    Refill:  0     Procedures: No procedures performed  Clinical Data: No additional findings.  ROS:  All other systems negative, except as noted in the HPI. Review of Systems  Objective: Vital Signs: Ht 5\' 8"  (1.727 m)   Wt 165 lb (74.8 kg)   LMP  (LMP Unknown)   BMI 25.09 kg/m   Specialty Comments:  No specialty comments available.  PMFS History: Patient Active Problem List   Diagnosis Date Noted  . Claw toe, acquired, right   . Cough 04/21/2018  . Bronchitis 04/21/2018  . Abnormal EKG 12/20/2014  . Other fatigue 12/20/2014  . RBBB 12/20/2013  . LAFB (left anterior fascicular  block) 12/20/2013  . Bifascicular block 12/20/2013  . RA (rheumatoid arthritis) ([facility]) 12/20/2013  . Rheumatoid lung disease ([facility]) 10/12/2013  . Osteopenia 06/14/2013  . Hypertension   . RHINITIS 02/25/2009  . ARTHRITIS, RHEUMATOID 05/03/2008   Past Medical History:  Diagnosis Date  . Hypercholesterolemia   . Hypertension   . Osteopenia 09/2017   T score -2.1 FRAX 18% / 2.7%  . PONV (postoperative nausea and vomiting)   . Rheumatoid arthritis(714.0)     Family History  Problem Relation Age of Onset  . Cancer Mother        LUNG  . Hypertension Sister   . Cancer Sister        LUNG, THYROID, UTERINE  . Cancer Brother        LUNG  . Cancer  Sister        Lung cancer  . Colon cancer Neg Hx     Past Surgical History:  Procedure Laterality Date  . ABDOMINAL HYSTERECTOMY  1991   TAH/BSO  . BARTHOLIN GLAND CYST EXCISION  1980   RIGHT  . BREAST SURGERY  1988   BREAST CYST  . COLONOSCOPY    . COLONOSCOPY N/A 08/24/2013   Procedure: COLONOSCOPY;  Surgeon: Rogene Houston, MD;  [facility];  Service: Endoscopy;  Laterality: N/A;  100  . FOOT SURGERY     2 TIMES  . FOOT SURGERY Left   . HAND SURGERY    . HEMORRHOID SURGERY     been over 20 years ago  . OLECRANON BURSECTOMY Right 11/29/2013   Procedure: EXCISION OLECRANON BURSA RIGHT ;  Surgeon: Wynonia Sours, MD;  Location: [facility];  Service: Orthopedics;  Laterality: Right;  . OOPHORECTOMY     BSO  . TENOSYNOVECTOMY Right 11/29/2013   Procedure: TENOSYNOVECTOMY FLEXOR TENDON RIGHT WRIST ;  Surgeon: Wynonia Sours, MD;  Location: [facility];  Service: Orthopedics;  Laterality: Right;  . WEIL OSTEOTOMY Right 08/10/2018   Procedure: WEIL OSTEOTOMY 2, 3,4 METATARSAL RIGHT FOOT, PROXIMAL INTERPHALANGEAL RESECTION TOES 2, 3, 4;  Surgeon: Newt Minion, MD;  Location: [facility];  Service: Orthopedics;  Laterality: Right;   Social History   Occupational History  . Not on file  Tobacco Use  . Smoking status: Former Smoker    Packs/day: 0.80    Years: 30.00    Pack years: 24.00    Types: Cigarettes    Last attempt to quit: 07/27/2004    Years since quitting: 14.1  . Smokeless tobacco: Never Used  Substance and Sexual Activity  . Alcohol use: No    Alcohol/week: 0.0 standard drinks  . Drug use: No  . Sexual activity: Not Currently    Birth control/protection: Surgical    Comment: 1st intercourse 78 yo-Fewer than 5 partners

## 2018-09-06 NOTE — Telephone Encounter (Signed)
Patient cancelled appt per rotary caller. Cancelled appt

## 2018-09-07 ENCOUNTER — Ambulatory Visit (INDEPENDENT_AMBULATORY_CARE_PROVIDER_SITE_OTHER): Payer: Medicare HMO | Admitting: Orthopedic Surgery

## 2018-09-08 ENCOUNTER — Encounter (INDEPENDENT_AMBULATORY_CARE_PROVIDER_SITE_OTHER): Payer: Self-pay | Admitting: Physician Assistant

## 2018-09-12 ENCOUNTER — Ambulatory Visit (INDEPENDENT_AMBULATORY_CARE_PROVIDER_SITE_OTHER): Payer: Medicare HMO | Admitting: Physician Assistant

## 2018-09-12 ENCOUNTER — Encounter (INDEPENDENT_AMBULATORY_CARE_PROVIDER_SITE_OTHER): Payer: Self-pay | Admitting: Physician Assistant

## 2018-09-12 DIAGNOSIS — M205X1 Other deformities of toe(s) (acquired), right foot: Secondary | ICD-10-CM

## 2018-09-12 NOTE — Progress Notes (Signed)
Office Visit Note   Patient: Bethany Grant           Date of Birth: 15-Jul-1941           MRN: [id number] Visit Date: 09/12/2018              Requested by: Celene Squibb, MD [place], [place] [address] PCP: Celene Squibb, MD  Chief Complaint  Patient presents with  . Right Foot - Follow-up      HPI: The patient is a 78 year old woman here with her husband for postoperative follow-up following Weil osteotomies of the right foot second and third and fourth toes on 08/10/2018.  She is 4.5 weeks postop.  She has been utilizing a knee scooter and maintaining nonweightbearing.  She has continued to have improvement in her peri-incisional erythema, superficial epidermolysis and edema.  She has continued to remain off of her methotrexate and Plaquenil.  Assessment & Plan: Visit Diagnoses:  1. Claw toe, acquired, right     Plan: Instructed the patient she may shower and wash the foot with Dial soap and water and continue to apply Bactroban ointment as needed to the incisional area.  She is going to utilize a mouse pad on the fourth toe as it is elevated and the little toe is folding under the fourth toe.  She can resume her methotrexate and Plaquenil at this time.  She may begin gradually weightbearing as tolerated in a regular shoe.  She will follow-up here in 3 weeks.  Follow-Up Instructions: Return in about 3 weeks (around 10/03/2018).   Ortho Exam  Patient is alert, oriented, no adenopathy, well-dressed, normal affect, normal respiratory effort. The incision over the dorsum of the right foot is healing well with only minimal superficial skin slough.  The blistering over the tips of the toes of the second third and fourth toes are all markedly improved and these were able to be removed with gauze today with healthy pink appearing epidermis underneath without open wound.  There are no signs of cellulitis or infection.  Her edema is much improved.  The fourth toe is raising up  somewhat and the little toe on the right foot is folding under the fourth toe and working to start utilizing a mouse pad for this.  Imaging: No results found. No images are attached to the encounter.  Labs: Lab Results  Component Value Date   REPTSTATUS 05/12/2008 FINAL 05/07/2008   REPTSTATUS 05/07/2008 FINAL 05/07/2008   GRAMSTAIN  05/07/2008    RARE WBC PRESENT, PREDOMINANTLY MONONUCLEAR NO ORGANISMS SEEN NO ORGANISMS SEEN   CULT NO LEGIONELLA ISOLATED 05/07/2008   LABORGA No Herpes Simplex Virus detected. 03/25/2016     No results found for: ALBUMIN, PREALBUMIN, LABURIC  There is no height or weight on file to calculate BMI.  Orders:  No orders of the defined types were placed in this encounter.  No orders of the defined types were placed in this encounter.    Procedures: No procedures performed  Clinical Data: No additional findings.  ROS:  All other systems negative, except as noted in the HPI. Review of Systems  Objective: Vital Signs: LMP  (LMP Unknown)   Specialty Comments:  No specialty comments available.  PMFS History: Patient Active Problem List   Diagnosis Date Noted  . Claw toe, acquired, right   . Cough 04/21/2018  . Bronchitis 04/21/2018  . Abnormal EKG 12/20/2014  . Other fatigue 12/20/2014  . RBBB 12/20/2013  .  LAFB (left anterior fascicular block) 12/20/2013  . Bifascicular block 12/20/2013  . RA (rheumatoid arthritis) ([facility]) 12/20/2013  . Rheumatoid lung disease ([facility]) 10/12/2013  . Osteopenia 06/14/2013  . Hypertension   . RHINITIS 02/25/2009  . ARTHRITIS, RHEUMATOID 05/03/2008   Past Medical History:  Diagnosis Date  . Hypercholesterolemia   . Hypertension   . Osteopenia 09/2017   T score -2.1 FRAX 18% / 2.7%  . PONV (postoperative nausea and vomiting)   . Rheumatoid arthritis(714.0)     Family History  Problem Relation Age of Onset  . Cancer Mother        LUNG  . Hypertension Sister   . Cancer Sister        LUNG,  THYROID, UTERINE  . Cancer Brother        LUNG  . Cancer Sister        Lung cancer  . Colon cancer Neg Hx     Past Surgical History:  Procedure Laterality Date  . ABDOMINAL HYSTERECTOMY  1991   TAH/BSO  . BARTHOLIN GLAND CYST EXCISION  1980   RIGHT  . BREAST SURGERY  1988   BREAST CYST  . COLONOSCOPY    . COLONOSCOPY N/A 08/24/2013   Procedure: COLONOSCOPY;  Surgeon: Rogene Houston, MD;  [facility];  Service: Endoscopy;  Laterality: N/A;  100  . FOOT SURGERY     2 TIMES  . FOOT SURGERY Left   . HAND SURGERY    . HEMORRHOID SURGERY     been over 20 years ago  . OLECRANON BURSECTOMY Right 11/29/2013   Procedure: EXCISION OLECRANON BURSA RIGHT ;  Surgeon: Wynonia Sours, MD;  Location: [facility];  Service: Orthopedics;  Laterality: Right;  . OOPHORECTOMY     BSO  . TENOSYNOVECTOMY Right 11/29/2013   Procedure: TENOSYNOVECTOMY FLEXOR TENDON RIGHT WRIST ;  Surgeon: Wynonia Sours, MD;  Location: [facility];  Service: Orthopedics;  Laterality: Right;  . WEIL OSTEOTOMY Right 08/10/2018   Procedure: WEIL OSTEOTOMY 2, 3,4 METATARSAL RIGHT FOOT, PROXIMAL INTERPHALANGEAL RESECTION TOES 2, 3, 4;  Surgeon: Newt Minion, MD;  Location: [facility];  Service: Orthopedics;  Laterality: Right;   Social History   Occupational History  . Not on file  Tobacco Use  . Smoking status: Former Smoker    Packs/day: 0.80    Years: 30.00    Pack years: 24.00    Types: Cigarettes    Last attempt to quit: 07/27/2004    Years since quitting: 14.1  . Smokeless tobacco: Never Used  Substance and Sexual Activity  . Alcohol use: No    Alcohol/week: 0.0 standard drinks  . Drug use: No  . Sexual activity: Not Currently    Birth control/protection: Surgical    Comment: 1st intercourse 78 yo-Fewer than 5 partners

## 2018-09-13 NOTE — Telephone Encounter (Signed)
ERROR

## 2018-09-20 ENCOUNTER — Telehealth (INDEPENDENT_AMBULATORY_CARE_PROVIDER_SITE_OTHER): Payer: Self-pay | Admitting: Physician Assistant

## 2018-09-20 NOTE — Telephone Encounter (Signed)
Patient called requesting a new spacer for her little toe.  The other one broke this morning.  She stated that if there was a problem to please give her a call otherwise she will be able to pick it up tomorrow morning.  CB#[phone].  Thank you.

## 2018-09-21 NOTE — Telephone Encounter (Signed)
Pt came in to pick up toe separator.

## 2018-10-03 ENCOUNTER — Ambulatory Visit (INDEPENDENT_AMBULATORY_CARE_PROVIDER_SITE_OTHER): Payer: Medicare HMO | Admitting: Physician Assistant

## 2018-10-03 ENCOUNTER — Encounter (INDEPENDENT_AMBULATORY_CARE_PROVIDER_SITE_OTHER): Payer: Self-pay | Admitting: Physician Assistant

## 2018-10-03 VITALS — Ht 68.0 in | Wt 165.0 lb

## 2018-10-03 DIAGNOSIS — M205X1 Other deformities of toe(s) (acquired), right foot: Secondary | ICD-10-CM

## 2018-10-03 NOTE — Progress Notes (Signed)
Office Visit Note   Patient: Bethany Grant           Date of Birth: 1941-03-30           MRN: [id number] Visit Date: 10/03/2018              Requested by: Celene Squibb, MD [place], [place] [address] PCP: Celene Squibb, MD  Chief Complaint  Patient presents with  . Right Foot - Routine Post Op    F/u Weil osteotomy 2nd, 3rd and 4th toes 08/10/2018      HPI: The patient is a 78 year old woman here with her husband for postoperative follow-up following Weil osteotomies of the right foot second and third and fourth toes on 08/10/2018.  She is about 7 weeks postop.  She has been weightbearing as tolerated in her postoperative shoe but cannot yet fit into her regular shoes.  She has been using a mouse pad on the fourth toe as this is helping with the fifth toe which is continuing to deviate medially under the fourth toe.  She has been washing the foot daily with soap and water.  She is having some continued swelling of the foot.  Assessment & Plan: Visit Diagnoses:  1. Claw toe, acquired, right     Plan: Recommend continue to use the mouse pad on the fourth toe with a spacer between the fourth and fifth toe.  Dr. Sharol Given saw the patient recommended scar massage and stretching of the toes as they are riding up somewhat.  Recommend use Shea butter or cocoa butter.  She can continue to weight-bear as tolerated and do activities to tolerance.  Recommend she can begin wearing a regular sneaker when she is able to get her foot into it.  She will follow-up in 3 weeks.  Follow-Up Instructions: Return in about 3 weeks (around 10/24/2018).   Ortho Exam  Patient is alert, oriented, no adenopathy, well-dressed, normal affect, normal respiratory effort. The right foot incision is well healed with a central adherent dark eschar. There is no sign of infection or cellulitis. Moderate edema. Toe 2-4 are riding up and the 5th toe continues to deviate medially, but it is improving from pre  operatively. Palpable pedal pulses.   Imaging: No results found. No images are attached to the encounter.  Labs: Lab Results  Component Value Date   REPTSTATUS 05/12/2008 FINAL 05/07/2008   REPTSTATUS 05/07/2008 FINAL 05/07/2008   GRAMSTAIN  05/07/2008    RARE WBC PRESENT, PREDOMINANTLY MONONUCLEAR NO ORGANISMS SEEN NO ORGANISMS SEEN   CULT NO LEGIONELLA ISOLATED 05/07/2008   LABORGA No Herpes Simplex Virus detected. 03/25/2016     No results found for: ALBUMIN, PREALBUMIN, LABURIC  Body mass index is 25.09 kg/m.  Orders:  No orders of the defined types were placed in this encounter.  No orders of the defined types were placed in this encounter.    Procedures: No procedures performed  Clinical Data: No additional findings.  ROS:  All other systems negative, except as noted in the HPI. Review of Systems  Objective: Vital Signs: Ht 5\' 8"  (1.727 m)   Wt 165 lb (74.8 kg)   LMP  (LMP Unknown)   BMI 25.09 kg/m   Specialty Comments:  No specialty comments available.  PMFS History: Patient Active Problem List   Diagnosis Date Noted  . Claw toe, acquired, right   . Cough 04/21/2018  . Bronchitis 04/21/2018  . Abnormal EKG 12/20/2014  . Other  fatigue 12/20/2014  . RBBB 12/20/2013  . LAFB (left anterior fascicular block) 12/20/2013  . Bifascicular block 12/20/2013  . RA (rheumatoid arthritis) ([place]) 12/20/2013  . Rheumatoid lung disease ([place]) 10/12/2013  . Osteopenia 06/14/2013  . Hypertension   . RHINITIS 02/25/2009  . ARTHRITIS, RHEUMATOID 05/03/2008   Past Medical History:  Diagnosis Date  . Hypercholesterolemia   . Hypertension   . Osteopenia 09/2017   T score -2.1 FRAX 18% / 2.7%  . PONV (postoperative nausea and vomiting)   . Rheumatoid arthritis(714.0)     Family History  Problem Relation Age of Onset  . Cancer Mother        LUNG  . Hypertension Sister   . Cancer Sister        LUNG, THYROID, UTERINE  . Cancer Brother        LUNG  .  Cancer Sister        Lung cancer  . Colon cancer Neg Hx     Past Surgical History:  Procedure Laterality Date  . ABDOMINAL HYSTERECTOMY  1991   TAH/BSO  . BARTHOLIN GLAND CYST EXCISION  1980   RIGHT  . BREAST SURGERY  1988   BREAST CYST  . COLONOSCOPY    . COLONOSCOPY N/A 08/24/2013   Procedure: COLONOSCOPY;  Surgeon: Rogene Houston, MD;  [facility];  Service: Endoscopy;  Laterality: N/A;  100  . FOOT SURGERY     2 TIMES  . FOOT SURGERY Left   . HAND SURGERY    . HEMORRHOID SURGERY     been over 20 years ago  . OLECRANON BURSECTOMY Right 11/29/2013   Procedure: EXCISION OLECRANON BURSA RIGHT ;  Surgeon: Wynonia Sours, MD;  Location: [facility];  Service: Orthopedics;  Laterality: Right;  . OOPHORECTOMY     BSO  . TENOSYNOVECTOMY Right 11/29/2013   Procedure: TENOSYNOVECTOMY FLEXOR TENDON RIGHT WRIST ;  Surgeon: Wynonia Sours, MD;  Location: [facility];  Service: Orthopedics;  Laterality: Right;  . WEIL OSTEOTOMY Right 08/10/2018   Procedure: WEIL OSTEOTOMY 2, 3,4 METATARSAL RIGHT FOOT, PROXIMAL INTERPHALANGEAL RESECTION TOES 2, 3, 4;  Surgeon: Newt Minion, MD;  Location: [facility];  Service: Orthopedics;  Laterality: Right;   Social History   Occupational History  . Not on file  Tobacco Use  . Smoking status: Former Smoker    Packs/day: 0.80    Years: 30.00    Pack years: 24.00    Types: Cigarettes    Last attempt to quit: 07/27/2004    Years since quitting: 14.1  . Smokeless tobacco: Never Used  Substance and Sexual Activity  . Alcohol use: No    Alcohol/week: 0.0 standard drinks  . Drug use: No  . Sexual activity: Not Currently    Birth control/protection: Surgical    Comment: 1st intercourse 78 yo-Fewer than 5 partners

## 2018-10-06 DIAGNOSIS — M069 Rheumatoid arthritis, unspecified: Secondary | ICD-10-CM | POA: Diagnosis not present

## 2018-10-06 DIAGNOSIS — H524 Presbyopia: Secondary | ICD-10-CM | POA: Diagnosis not present

## 2018-10-06 DIAGNOSIS — H2513 Age-related nuclear cataract, bilateral: Secondary | ICD-10-CM | POA: Diagnosis not present

## 2018-10-06 DIAGNOSIS — H5203 Hypermetropia, bilateral: Secondary | ICD-10-CM | POA: Diagnosis not present

## 2018-10-06 DIAGNOSIS — H52203 Unspecified astigmatism, bilateral: Secondary | ICD-10-CM | POA: Diagnosis not present

## 2018-10-06 DIAGNOSIS — H04123 Dry eye syndrome of bilateral lacrimal glands: Secondary | ICD-10-CM | POA: Diagnosis not present

## 2018-10-06 DIAGNOSIS — Z79899 Other long term (current) drug therapy: Secondary | ICD-10-CM | POA: Diagnosis not present

## 2018-10-21 ENCOUNTER — Telehealth (INDEPENDENT_AMBULATORY_CARE_PROVIDER_SITE_OTHER): Payer: Self-pay

## 2018-10-21 NOTE — Telephone Encounter (Signed)
Pt was called and prescreened and answered no to all questions.

## 2018-10-24 ENCOUNTER — Ambulatory Visit (INDEPENDENT_AMBULATORY_CARE_PROVIDER_SITE_OTHER): Payer: Medicare HMO | Admitting: Orthopedic Surgery

## 2018-11-09 DIAGNOSIS — Z0000 Encounter for general adult medical examination without abnormal findings: Secondary | ICD-10-CM | POA: Diagnosis not present

## 2018-11-30 DIAGNOSIS — M0579 Rheumatoid arthritis with rheumatoid factor of multiple sites without organ or systems involvement: Secondary | ICD-10-CM | POA: Diagnosis not present

## 2018-12-08 DIAGNOSIS — N182 Chronic kidney disease, stage 2 (mild): Secondary | ICD-10-CM | POA: Diagnosis not present

## 2018-12-08 DIAGNOSIS — I10 Essential (primary) hypertension: Secondary | ICD-10-CM | POA: Diagnosis not present

## 2018-12-08 DIAGNOSIS — D649 Anemia, unspecified: Secondary | ICD-10-CM | POA: Diagnosis not present

## 2018-12-12 ENCOUNTER — Other Ambulatory Visit: Payer: Self-pay

## 2018-12-12 ENCOUNTER — Ambulatory Visit (INDEPENDENT_AMBULATORY_CARE_PROVIDER_SITE_OTHER): Payer: Medicare HMO | Admitting: Orthopedic Surgery

## 2018-12-12 ENCOUNTER — Encounter: Payer: Self-pay | Admitting: Orthopedic Surgery

## 2018-12-12 VITALS — Ht 68.0 in | Wt 165.0 lb

## 2018-12-12 DIAGNOSIS — M205X1 Other deformities of toe(s) (acquired), right foot: Secondary | ICD-10-CM

## 2018-12-12 DIAGNOSIS — M79671 Pain in right foot: Secondary | ICD-10-CM

## 2018-12-12 NOTE — Progress Notes (Signed)
Office Visit Note   Patient: Bethany Grant           Date of Birth: 10-29-1940           MRN: [id number] Visit Date: 12/12/2018              Requested by: Celene Squibb, MD [place], [place] [address] PCP: Celene Squibb, MD  Chief Complaint  Patient presents with  . Right Foot - Follow-up    08/10/18 right foot Weil osteotomies 2nd, 3rd and 4th toes.       HPI: The patient is a 77 yo woman who is seen for post operative follow up following right foot weil osteotomy of 2nd, 3rd and 4th toes 08/10/2018.  She has been using a mouse pad over the 4th toe and a spacer between the 4th and 5th toe, but reports the toes are raising up and the 5th toe is now going under the 4th toe making walking painful in her regular shoes.   Assessment & Plan: Visit Diagnoses:  1. Claw toe, acquired, right   2. Pain in right foot     Plan: Counseled to work on scar massage at least several times daily and range of motion over the 2nd, 3rd and 4th toes and also to utilize a silicone sleeve over the 5th toe and continue with the mouse pad over the 3rd and 4th toes together to see if this will help lower the 2nd through 4th toe and keep the 5th toe in place. She will try this for several weeks and follow up in 3 weeks.   Follow-Up Instructions: Return in about 3 weeks (around 01/02/2019).   Ortho Exam  Patient is alert, oriented, no adenopathy, well-dressed, normal affect, normal respiratory effort. The right foot incision is well healed, but she is scarred down and the 2nd through 4th toes are lifting up. The 5th toe is sliding under the 4th toe as well. There is no sign of infection or cellulitis. Demonstrated scar massage and range of motion exercises for the toes and positioning with silicone sleeve to the 5th toe and mouse pad on the 3rd and 4th toe to help positioning.   Imaging: No results found. No images are attached to the encounter.  Labs: Lab Results  Component Value Date   REPTSTATUS 05/12/2008 FINAL 05/07/2008   REPTSTATUS 05/07/2008 FINAL 05/07/2008   GRAMSTAIN  05/07/2008    RARE WBC PRESENT, PREDOMINANTLY MONONUCLEAR NO ORGANISMS SEEN NO ORGANISMS SEEN   CULT NO LEGIONELLA ISOLATED 05/07/2008   LABORGA No Herpes Simplex Virus detected. 03/25/2016     No results found for: ALBUMIN, PREALBUMIN, LABURIC  Body mass index is 25.09 kg/m.  Orders:  No orders of the defined types were placed in this encounter.  No orders of the defined types were placed in this encounter.    Procedures: No procedures performed  Clinical Data: No additional findings.  ROS:  All other systems negative, except as noted in the HPI. Review of Systems  Objective: Vital Signs: Ht 5\' 8"  (1.727 m)   Wt 165 lb (74.8 kg)   LMP  (LMP Unknown)   BMI 25.09 kg/m   Specialty Comments:  No specialty comments available.  PMFS History: Patient Active Problem List   Diagnosis Date Noted  . Claw toe, acquired, right   . Cough 04/21/2018  . Bronchitis 04/21/2018  . Abnormal EKG 12/20/2014  . Other fatigue 12/20/2014  . RBBB 12/20/2013  .  LAFB (left anterior fascicular block) 12/20/2013  . Bifascicular block 12/20/2013  . RA (rheumatoid arthritis) ([place] [facility]) 12/20/2013  . Rheumatoid lung disease ([facility]) 10/12/2013  . Osteopenia 06/14/2013  . Hypertension   . RHINITIS 02/25/2009  . ARTHRITIS, RHEUMATOID 05/03/2008   Past Medical History:  Diagnosis Date  . Hypercholesterolemia   . Hypertension   . Osteopenia 09/2017   T score -2.1 FRAX 18% / 2.7%  . PONV (postoperative nausea and vomiting)   . Rheumatoid arthritis(714.0)     Family History  Problem Relation Age of Onset  . Cancer Mother        LUNG  . Hypertension Sister   . Cancer Sister        LUNG, THYROID, UTERINE  . Cancer Brother        LUNG  . Cancer Sister        Lung cancer  . Colon cancer Neg Hx     Past Surgical History:  Procedure Laterality Date  . ABDOMINAL HYSTERECTOMY  1991   TAH/BSO   . BARTHOLIN GLAND CYST EXCISION  1980   RIGHT  . BREAST SURGERY  1988   BREAST CYST  . COLONOSCOPY    . COLONOSCOPY N/A 08/24/2013   Procedure: COLONOSCOPY;  Surgeon: Rogene Houston, MD;  [facility];  Service: Endoscopy;  Laterality: N/A;  100  . FOOT SURGERY     2 TIMES  . FOOT SURGERY Left   . HAND SURGERY    . HEMORRHOID SURGERY     been over 20 years ago  . OLECRANON BURSECTOMY Right 11/29/2013   Procedure: EXCISION OLECRANON BURSA RIGHT ;  Surgeon: Wynonia Sours, MD;  Location: [facility];  Service: Orthopedics;  Laterality: Right;  . OOPHORECTOMY     BSO  . TENOSYNOVECTOMY Right 11/29/2013   Procedure: TENOSYNOVECTOMY FLEXOR TENDON RIGHT WRIST ;  Surgeon: Wynonia Sours, MD;  Location: [facility];  Service: Orthopedics;  Laterality: Right;  . WEIL OSTEOTOMY Right 08/10/2018   Procedure: WEIL OSTEOTOMY 2, 3,4 METATARSAL RIGHT FOOT, PROXIMAL INTERPHALANGEAL RESECTION TOES 2, 3, 4;  Surgeon: Newt Minion, MD;  Location: [facility];  Service: Orthopedics;  Laterality: Right;   Social History   Occupational History  . Not on file  Tobacco Use  . Smoking status: Former Smoker    Packs/day: 0.80    Years: 30.00    Pack years: 24.00    Types: Cigarettes    Last attempt to quit: 07/27/2004    Years since quitting: 14.3  . Smokeless tobacco: Never Used  Substance and Sexual Activity  . Alcohol use: No    Alcohol/week: 0.0 standard drinks  . Drug use: No  . Sexual activity: Not Currently    Birth control/protection: Surgical    Comment: 1st intercourse 78 yo-Fewer than 5 partners

## 2018-12-14 DIAGNOSIS — M0600 Rheumatoid arthritis without rheumatoid factor, unspecified site: Secondary | ICD-10-CM | POA: Diagnosis not present

## 2018-12-14 DIAGNOSIS — R69 Illness, unspecified: Secondary | ICD-10-CM | POA: Diagnosis not present

## 2018-12-14 DIAGNOSIS — I10 Essential (primary) hypertension: Secondary | ICD-10-CM | POA: Diagnosis not present

## 2018-12-14 DIAGNOSIS — N182 Chronic kidney disease, stage 2 (mild): Secondary | ICD-10-CM | POA: Diagnosis not present

## 2018-12-14 DIAGNOSIS — D696 Thrombocytopenia, unspecified: Secondary | ICD-10-CM | POA: Diagnosis not present

## 2018-12-14 DIAGNOSIS — M8580 Other specified disorders of bone density and structure, unspecified site: Secondary | ICD-10-CM | POA: Diagnosis not present

## 2018-12-26 ENCOUNTER — Ambulatory Visit: Payer: Medicare HMO | Admitting: Orthopedic Surgery

## 2018-12-29 ENCOUNTER — Ambulatory Visit (INDEPENDENT_AMBULATORY_CARE_PROVIDER_SITE_OTHER): Payer: Medicare HMO | Admitting: Orthopedic Surgery

## 2018-12-29 ENCOUNTER — Encounter: Payer: Self-pay | Admitting: Orthopedic Surgery

## 2018-12-29 ENCOUNTER — Other Ambulatory Visit: Payer: Self-pay

## 2018-12-29 VITALS — Ht 68.0 in | Wt 165.0 lb

## 2018-12-29 DIAGNOSIS — M205X1 Other deformities of toe(s) (acquired), right foot: Secondary | ICD-10-CM

## 2019-01-03 ENCOUNTER — Encounter: Payer: Self-pay | Admitting: Orthopedic Surgery

## 2019-01-03 NOTE — Progress Notes (Signed)
Office Visit Note   Patient: Bethany Grant           Date of Birth: 09/06/1940           MRN: [id number] Visit Date: 12/29/2018              Requested by: Celene Squibb, MD [place], [place] [address] PCP: Celene Squibb, MD  Chief Complaint  Patient presents with  . Right Foot - Routine Post Op    08/10/18 right foot Weil osteotomy 2nd, 3rd, 4th toes.       HPI: Patient is a 78 year old woman who presents follow-up status post forefoot reconstruction with reconstruction of the claw toes 2 3 and 4.  Patient states that she has the fifth toe which now is going underneath the fourth toe.  She has used scar massage she is used mouse pads to support her toes.  Assessment & Plan: Visit Diagnoses:  1. Claw toe, acquired, right     Plan: Discussed that since she has no ulcers and her foot is comfortable with weightbearing would not recommend any straightening procedure for the fifth toe.  Discussed that if she starts developing ulcers or has difficulty with shoe wear we could proceed with straightening of the fifth toe but this would make her foot wider and would be difficult for her to wear her current shoes.  Follow-Up Instructions: Return if symptoms worsen or fail to improve.   Ortho Exam  Patient is alert, oriented, no adenopathy, well-dressed, normal affect, normal respiratory effort. Examination patient's toes 2 3 and 4 straight the fifth toe is now curling under the fourth toe.  There are no ulcers no redness no abrasion her foot is well on her shoe.  Imaging: No results found. No images are attached to the encounter.  Labs: Lab Results  Component Value Date   REPTSTATUS 05/12/2008 FINAL 05/07/2008   REPTSTATUS 05/07/2008 FINAL 05/07/2008   GRAMSTAIN  05/07/2008    RARE WBC PRESENT, PREDOMINANTLY MONONUCLEAR NO ORGANISMS SEEN NO ORGANISMS SEEN   CULT NO LEGIONELLA ISOLATED 05/07/2008   LABORGA No Herpes Simplex Virus detected. 03/25/2016     No  results found for: ALBUMIN, PREALBUMIN, LABURIC  Body mass index is 25.09 kg/m.  Orders:  No orders of the defined types were placed in this encounter.  No orders of the defined types were placed in this encounter.    Procedures: No procedures performed  Clinical Data: No additional findings.  ROS:  All other systems negative, except as noted in the HPI. Review of Systems  Objective: Vital Signs: Ht 5\' 8"  (1.727 m)   Wt 165 lb (74.8 kg)   LMP  (LMP Unknown)   BMI 25.09 kg/m   Specialty Comments:  No specialty comments available.  PMFS History: Patient Active Problem List   Diagnosis Date Noted  . Claw toe, acquired, right   . Cough 04/21/2018  . Bronchitis 04/21/2018  . Abnormal EKG 12/20/2014  . Other fatigue 12/20/2014  . RBBB 12/20/2013  . LAFB (left anterior fascicular block) 12/20/2013  . Bifascicular block 12/20/2013  . RA (rheumatoid arthritis) ([facility] [facility]) 12/20/2013  . Rheumatoid lung disease ([place]) 10/12/2013  . Osteopenia 06/14/2013  . Hypertension   . RHINITIS 02/25/2009  . ARTHRITIS, RHEUMATOID 05/03/2008   Past Medical History:  Diagnosis Date  . Hypercholesterolemia   . Hypertension   . Osteopenia 09/2017   T score -2.1 FRAX 18% / 2.7%  . PONV (postoperative nausea and  vomiting)   . Rheumatoid arthritis(714.0)     Family History  Problem Relation Age of Onset  . Cancer Mother        LUNG  . Hypertension Sister   . Cancer Sister        LUNG, THYROID, UTERINE  . Cancer Brother        LUNG  . Cancer Sister        Lung cancer  . Colon cancer Neg Hx     Past Surgical History:  Procedure Laterality Date  . ABDOMINAL HYSTERECTOMY  1991   TAH/BSO  . BARTHOLIN GLAND CYST EXCISION  1980   RIGHT  . BREAST SURGERY  1988   BREAST CYST  . COLONOSCOPY    . COLONOSCOPY N/A 08/24/2013   Procedure: COLONOSCOPY;  Surgeon: Rogene Houston, MD;  [facility];  Service: Endoscopy;  Laterality: N/A;  100  . FOOT SURGERY     2 TIMES  .  FOOT SURGERY Left   . HAND SURGERY    . HEMORRHOID SURGERY     been over 20 years ago  . OLECRANON BURSECTOMY Right 11/29/2013   Procedure: EXCISION OLECRANON BURSA RIGHT ;  Surgeon: Wynonia Sours, MD;  Location: [facility];  Service: Orthopedics;  Laterality: Right;  . OOPHORECTOMY     BSO  . TENOSYNOVECTOMY Right 11/29/2013   Procedure: TENOSYNOVECTOMY FLEXOR TENDON RIGHT WRIST ;  Surgeon: Wynonia Sours, MD;  Location: [facility];  Service: Orthopedics;  Laterality: Right;  . WEIL OSTEOTOMY Right 08/10/2018   Procedure: WEIL OSTEOTOMY 2, 3,4 METATARSAL RIGHT FOOT, PROXIMAL INTERPHALANGEAL RESECTION TOES 2, 3, 4;  Surgeon: Newt Minion, MD;  Location: [facility];  Service: Orthopedics;  Laterality: Right;   Social History   Occupational History  . Not on file  Tobacco Use  . Smoking status: Former Smoker    Packs/day: 0.80    Years: 30.00    Pack years: 24.00    Types: Cigarettes    Last attempt to quit: 07/27/2004    Years since quitting: 14.4  . Smokeless tobacco: Never Used  Substance and Sexual Activity  . Alcohol use: No    Alcohol/week: 0.0 standard drinks  . Drug use: No  . Sexual activity: Not Currently    Birth control/protection: Surgical    Comment: 1st intercourse 78 yo-Fewer than 5 partners

## 2019-01-05 ENCOUNTER — Other Ambulatory Visit: Payer: Self-pay

## 2019-01-05 ENCOUNTER — Encounter: Payer: Self-pay | Admitting: Cardiology

## 2019-01-05 ENCOUNTER — Telehealth (INDEPENDENT_AMBULATORY_CARE_PROVIDER_SITE_OTHER): Payer: Medicare HMO | Admitting: Cardiology

## 2019-01-05 VITALS — BP 119/58 | HR 69 | Ht 68.0 in | Wt 169.0 lb

## 2019-01-05 DIAGNOSIS — I444 Left anterior fascicular block: Secondary | ICD-10-CM | POA: Diagnosis not present

## 2019-01-05 DIAGNOSIS — I4510 Unspecified right bundle-branch block: Secondary | ICD-10-CM

## 2019-01-05 DIAGNOSIS — I10 Essential (primary) hypertension: Secondary | ICD-10-CM

## 2019-01-05 DIAGNOSIS — M069 Rheumatoid arthritis, unspecified: Secondary | ICD-10-CM

## 2019-01-05 NOTE — Progress Notes (Signed)
Virtual Visit via Telephone Note   This visit type was conducted due to national recommendations for restrictions regarding the COVID-19 Pandemic (e.g. social distancing) in an effort to limit this patient's exposure and mitigate transmission in our community.  Due to her co-morbid illnesses, this patient is at least at moderate risk for complications without adequate follow up.  This format is felt to be most appropriate for this patient at this time.  The patient did not have access to video technology/had technical difficulties with video requiring transitioning to audio format only (telephone).  All issues noted in this document were discussed and addressed.  No physical exam could be performed with this format.  Please refer to the patient's chart for her  consent to telehealth for [facility].   Date:  01/05/2019   ID:  Bethany Grant, DOB February 05, 1941, MRN [id number]  Patient Location: Home Provider Location: Home  PCP:  Bethany Squibb, MD  Cardiologist:  Bethany Furbish, MD  Electrophysiologist:  None   Evaluation Performed:  Follow-Up Visit  Chief Complaint: Dyspnea  History of Present Illness:    Bethany Grant is a 78 y.o. female with Has a history of hyperlipidemia, hypertension and rheumatoid arthritis.  EKG demonstrated RBBB, LAFB, bifascicular block. PACs noted. Asymptomatic.  She has no early family history of CAD. She used to smoke in the distant past. She does have rheumatoid arthritis. Previous carpal tunnel surgery, cyst removal.  Foot surgery was performed in early 2020.  Enjoys gardening. No syncope.  Her husband had stent placement after cardiac catheterization. He was having similar symptoms of fatigue. She is concerned because she feels tired. Could be anemia, could be methotrexate could be rheumatoid arthritis.  Nuclear stress test in 2016 and 2018 was reassuring, low risk.  Overall she seems to be doing quite well, stable without any chest pain, no  significant shortness of breath, no syncope.  Tolerating her hypertensive medication well.  The patient does not have symptoms concerning for COVID-19 infection (fever, chills, cough, or new shortness of breath).    Past Medical History:  Diagnosis Date  . Hypercholesterolemia   . Hypertension   . Osteopenia 09/2017   T score -2.1 FRAX 18% / 2.7%  . PONV (postoperative nausea and vomiting)   . Rheumatoid arthritis(714.0)    Past Surgical History:  Procedure Laterality Date  . ABDOMINAL HYSTERECTOMY  1991   TAH/BSO  . BARTHOLIN GLAND CYST EXCISION  1980   RIGHT  . BREAST SURGERY  1988   BREAST CYST  . COLONOSCOPY    . COLONOSCOPY N/A 08/24/2013   Procedure: COLONOSCOPY;  Surgeon: Bethany Houston, MD;  [facility];  Service: Endoscopy;  Laterality: N/A;  100  . FOOT SURGERY     2 TIMES  . FOOT SURGERY Left   . HAND SURGERY    . HEMORRHOID SURGERY     been over 20 years ago  . OLECRANON BURSECTOMY Right 11/29/2013   Procedure: EXCISION OLECRANON BURSA RIGHT ;  Surgeon: Bethany Sours, MD;  Location: [facility];  Service: Orthopedics;  Laterality: Right;  . OOPHORECTOMY     BSO  . TENOSYNOVECTOMY Right 11/29/2013   Procedure: TENOSYNOVECTOMY FLEXOR TENDON RIGHT WRIST ;  Surgeon: Bethany Sours, MD;  Location: [facility];  Service: Orthopedics;  Laterality: Right;  . WEIL OSTEOTOMY Right 08/10/2018   Procedure: WEIL OSTEOTOMY 2, 3,4 METATARSAL RIGHT FOOT, PROXIMAL INTERPHALANGEAL RESECTION TOES 2, 3, 4;  Surgeon:  Bethany Minion, MD;  Location: [facility];  Service: Orthopedics;  Laterality: Right;     Current Meds  Medication Sig  . ALPRAZolam (XANAX) 0.25 MG tablet Take 0.25 mg by mouth at bedtime.   . Cholecalciferol (VITAMIN D3) 25 MCG (1000 UT) CAPS Take 1,000 Units by mouth daily.  . cycloSPORINE (RESTASIS) 0.05 % ophthalmic emulsion Place 1 drop into both eyes 2 (two) times daily.   Marland Kitchen doxycycline (VIBRAMYCIN) 100 MG capsule Take 1 capsule (100  mg total) by mouth 2 (two) times daily.  . fluticasone (KLS ALLER-FLO) 50 MCG/ACT nasal spray Place 1 spray into both nostrils daily as needed for allergies or rhinitis.  . folic acid (FOLVITE) 1 MG tablet Take 1 mg by mouth daily.  . hydroxychloroquine (PLAQUENIL) 200 MG tablet Take 400 mg by mouth daily.   Marland Kitchen ibuprofen (ADVIL,MOTRIN) 200 MG tablet Take 200 mg by mouth every 6 (six) hours as needed for headache or moderate pain.  Marland Kitchen losartan-hydrochlorothiazide (HYZAAR) 100-12.5 MG per tablet Take 1 tablet by mouth daily.  . methotrexate (RHEUMATREX) 2.5 MG tablet Take 2.5 mg by mouth See admin instructions. Take 7.5 mg by mouth daily on Friday and take 7.5 mg by mouth daily on Saturday.  . mupirocin ointment (BACTROBAN) 2 % Apply 1 application topically daily.  . nitroGLYCERIN (NITROSTAT) 0.4 MG SL tablet Place 1 tablet (0.4 mg total) under the tongue every 5 (five) minutes as needed for chest pain.  Marland Kitchen sulfamethoxazole-trimethoprim (BACTRIM DS,SEPTRA DS) 800-160 MG tablet Take 1 tablet by mouth 2 (two) times daily.     Allergies:   Other   Social History   Tobacco Use  . Smoking status: Former Smoker    Packs/day: 0.80    Years: 30.00    Pack years: 24.00    Types: Cigarettes    Quit date: 07/27/2004    Years since quitting: 14.4  . Smokeless tobacco: Never Used  Substance Use Topics  . Alcohol use: No    Alcohol/week: 0.0 standard drinks  . Drug use: No     Family Hx: The patient's family history includes Cancer in her brother, mother, sister, and sister; Hypertension in her sister. There is no history of Colon cancer.  ROS:   Please see the history of present illness.    Denies any fevers chills nausea vomiting syncope bleeding All other systems reviewed and are negative.   Prior CV studies:   The following studies were reviewed today:  NUC stress 12/16/16  Nuclear stress EF: 54%.  There was no ST segment deviation noted during stress.  This is a low risk study.  The  left ventricular ejection fraction is normal (55-65%).  Defect 1: There is a small defect of mild severity present in the basal inferoseptal and mid inferoseptal location. This is a fixed defect likely attributable to RBBB. There is no ischmia.   Labs/Other Tests and Data Reviewed:    EKG:  An ECG dated 08/08/18 was personally reviewed today and demonstrated:  Sinus bradycardia 58 with right bundle branch block left anterior fascicular block  Recent Labs: 08/08/2018: BUN 18; Creatinine, Ser 1.29; Hemoglobin 12.7; Platelets 163; Potassium 3.9; Sodium 137   Recent Lipid Panel No results found for: CHOL, TRIG, HDL, CHOLHDL, LDLCALC, LDLDIRECT  Wt Readings from Last 3 Encounters:  01/05/19 169 lb (76.7 kg)  12/29/18 165 lb (74.8 kg)  12/12/18 165 lb (74.8 kg)     Objective:    Vital Signs:  BP (!) 119/58  Pulse 69   Ht 5\' 8"  (1.727 m)   Wt 169 lb (76.7 kg)   LMP  (LMP Unknown)   BMI 25.70 kg/m    VITAL SIGNS:  reviewed pleasant, alert, able to complete full sentences without difficulty.  ASSESSMENT & PLAN:    Dyspnea/atypical chest pain  - Reassuring nuclear stress test as above.  - Discussed,reviewed results.  -Overall been steady and stable.  No escalation in symptoms.  She does admit that quite a few of her issues are secondary to rheumatoid arthritis.  Right bundle branch block/left anterior fascicular block/bifascicular block  - Pacemaker could be a possibility in the future. Try to avoid AV nodal blocking agents.    No syncope.  No symptoms.  No changes.  Essential hypertension  - Currently well controlled. No changes.  Medication reviewed.  Excellent.  Fatigue - Likely multifactorial.  Seems to be quite stable.  Sinus bradycardia -Noted on ECG 05/13/2017.  She did have chronotropic competence previously noted.  No syncope.  Minimal sinus bradycardia noted on EKG in 2020.  This was surrounding her foot surgery.  Her pulse currently is 69.  Excellent.   Rheumatoid arthritis - Medications reviewed.  Fairly stable at current time.  COVID-19 Education: The signs and symptoms of COVID-19 were discussed with the patient and how to seek care for testing (follow up with PCP or arrange E-visit).  The importance of social distancing was discussed today.  Time:   Today, I have spent 12 minutes with the patient with telehealth technology discussing the above problems.     Medication Adjustments/Labs and Tests Ordered: Current medicines are reviewed at length with the patient today.  Concerns regarding medicines are outlined above.   Tests Ordered: No orders of the defined types were placed in this encounter.   Medication Changes: No orders of the defined types were placed in this encounter.   Disposition:  Follow up in 1 year(s)  Signed, Bethany Furbish, MD  01/05/2019 10:48 AM    [facility]

## 2019-01-05 NOTE — Patient Instructions (Signed)
Medication Instructions:  Your provider recommends that you continue on your current medications as directed. Please refer to the Current Medication list given to you today.    Labwork: None  Testing/Procedures: None  Follow-Up: Your provider wants you to follow-up in: 1 year with Dr. Marlou Porch. You will receive a reminder letter in the mail two months in advance. If you don't receive a letter, please call our office to schedule the follow-up appointment.

## 2019-03-02 DIAGNOSIS — M0579 Rheumatoid arthritis with rheumatoid factor of multiple sites without organ or systems involvement: Secondary | ICD-10-CM | POA: Diagnosis not present

## 2019-03-30 DIAGNOSIS — B372 Candidiasis of skin and nail: Secondary | ICD-10-CM | POA: Diagnosis not present

## 2019-03-30 DIAGNOSIS — L820 Inflamed seborrheic keratosis: Secondary | ICD-10-CM | POA: Diagnosis not present

## 2019-03-30 DIAGNOSIS — D692 Other nonthrombocytopenic purpura: Secondary | ICD-10-CM | POA: Diagnosis not present

## 2019-03-30 DIAGNOSIS — L821 Other seborrheic keratosis: Secondary | ICD-10-CM | POA: Diagnosis not present

## 2019-04-14 DIAGNOSIS — M0600 Rheumatoid arthritis without rheumatoid factor, unspecified site: Secondary | ICD-10-CM | POA: Diagnosis not present

## 2019-04-14 DIAGNOSIS — N182 Chronic kidney disease, stage 2 (mild): Secondary | ICD-10-CM | POA: Diagnosis not present

## 2019-04-14 DIAGNOSIS — I10 Essential (primary) hypertension: Secondary | ICD-10-CM | POA: Diagnosis not present

## 2019-04-14 DIAGNOSIS — D696 Thrombocytopenia, unspecified: Secondary | ICD-10-CM | POA: Diagnosis not present

## 2019-04-14 DIAGNOSIS — R69 Illness, unspecified: Secondary | ICD-10-CM | POA: Diagnosis not present

## 2019-04-26 ENCOUNTER — Ambulatory Visit: Payer: Medicare HMO | Admitting: Pulmonary Disease

## 2019-04-28 ENCOUNTER — Encounter: Payer: Self-pay | Admitting: Pulmonary Disease

## 2019-04-28 ENCOUNTER — Ambulatory Visit: Payer: Medicare HMO | Admitting: Pulmonary Disease

## 2019-04-28 ENCOUNTER — Ambulatory Visit (INDEPENDENT_AMBULATORY_CARE_PROVIDER_SITE_OTHER): Payer: Medicare HMO

## 2019-04-28 ENCOUNTER — Other Ambulatory Visit: Payer: Self-pay

## 2019-04-28 VITALS — BP 118/64 | HR 60 | Temp 97.3°F | Ht 67.0 in | Wt 170.4 lb

## 2019-04-28 DIAGNOSIS — J849 Interstitial pulmonary disease, unspecified: Secondary | ICD-10-CM

## 2019-04-28 DIAGNOSIS — R911 Solitary pulmonary nodule: Secondary | ICD-10-CM | POA: Diagnosis not present

## 2019-04-28 NOTE — Patient Instructions (Signed)
Chest xray today, and will call with results  Follow up in 1 year

## 2019-04-28 NOTE — Progress Notes (Signed)
[facility]  Chief Complaint  Patient presents with  . ILD (Interstitial lung disease)    Feels breathing is the same as before.    Constitutional:  BP 118/64 (BP Location: Right Arm, Patient Position: Sitting, Cuff Size: Normal)   Pulse 60   Temp (!) 97.3 F (36.3 C)   Ht 5\' 7"  (1.702 m)   Wt 170 lb 6.4 oz (77.3 kg)   LMP  (LMP Unknown)   SpO2 98% Comment: on room air  BMI 26.69 kg/m   Past Medical History:  Endometriosis, HTN, HLD, RA, RBBB, Anxiety  Brief Summary:  Bethany Grant is a 78 y.o. female former smoker with lung nodules, family history of lung cancer, and history of rheumatoid arthritis.  Her breathing has been okay.  Not having cough, wheeze, sputum, chest pain, fever, sweats, hemoptysis.  Weigh has been steady.  No issues with breathing while asleep.  Keeps up with activities w/o difficulty.  Remains on MTX and plaquenil.  Had flu shot in September.   Physical Exam:   Appearance - well kempt   ENMT - clear nasal mucosa, midline nasal  septum, no oral exudates, no LAN, trachea midline  Respiratory - normal chest wall, normal respiratory effort, no accessory muscle use, no wheeze/rales  CV - s1s2 regular rate and rhythm, no murmurs, no peripheral edema, radial pulses symmetric  GI - soft, non tender, no masses  Lymph - no adenopathy noted in neck and axillary areas  MSK - normal gait  Ext - changes of RA in hands  Skin - no rashes, lesions, or ulcers  Neuro - normal strength, oriented x 3  Psych - normal mood and affect  Assessment/Plan:   History of tobacco abuse, and family history of lung cancer with prior imaging studies showing reticulonodular infiltrates in setting of rheumatoid arthritis. - will repeat chest xray today - if there are significant changes on CXR, then she would need high resolution CT +/- PFT  Rheumatoid arthritis.  - she remains on MTX and plaquenil and is followed by Dr. Amil Amen    Patient Instructions  Chest xray today, and will call with results  Follow up in 1 year    Chesley Mires, MD [place] Pager: [phone] 04/28/2019, 10:27 AM  Flow Sheet     Pulmonary tests:  Bronchoscopy Oct 2009>>Pneumatocyte atypia suggestive of obstructive pneumonitis, can be seen with RA in the lungs PFT 06/19/08>>FEV1 2.58(107%), FEV1% 71, TLC 6.07(111%), DLCO 96%, no BD  Sleep tests:  CT chest 01/25/08>>4 mm Lt lower lobe nodule, hazy reticular infiltrate LLL CT chest 04/18/08>>increase in LLL reticular infiltrate CT chest 05/23/09>>3.8 x 3.6 cm ascending aorta, scoliosis, decreased size LLL nodule, reticular changes LLL decreased  Medications:   Allergies as of 04/28/2019      Reactions   Other Nausea And Vomiting, Other (See Comments)   Doesn't do well with pain  Medications, sensitivity to tape      Medication List       Accurate as of April 28, 2019 10:27 AM. If you have any questions, ask your nurse or doctor.        STOP taking these medications   doxycycline 100 MG capsule Commonly known as: VIBRAMYCIN Stopped by: Chesley Mires, MD   sulfamethoxazole-trimethoprim 800-160 MG tablet Commonly known as: BACTRIM DS Stopped by: Chesley Mires, MD     TAKE these medications   ALPRAZolam 0.25 MG tablet Commonly known as: XANAX Take 0.25 mg by  mouth at bedtime.   ciclopirox 0.77 % cream Commonly known as: LOPROX as needed.   cycloSPORINE 0.05 % ophthalmic emulsion Commonly known as: RESTASIS Place 1 drop into both eyes 2 (two) times daily.   folic acid 1 MG tablet Commonly known as: FOLVITE Take 1 mg by mouth daily.   hydroxychloroquine 200 MG tablet Commonly known as: PLAQUENIL Take 400 mg by mouth daily.   ibuprofen 200 MG tablet Commonly known as: ADVIL Take 200 mg by mouth every 6 (six) hours as needed for headache or moderate pain.   KLS Aller-Flo 50 MCG/ACT nasal spray Generic drug: fluticasone Place  1 spray into both nostrils daily as needed for allergies or rhinitis.   losartan-hydrochlorothiazide 100-12.5 MG tablet Commonly known as: HYZAAR Take 1 tablet by mouth daily.   methotrexate 2.5 MG tablet Commonly known as: RHEUMATREX Take 2.5 mg by mouth See admin instructions. Take 7.5 mg by mouth daily on Friday and take 7.5 mg by mouth daily on Saturday.   mupirocin ointment 2 % Commonly known as: Bactroban Apply 1 application topically daily.   nitroGLYCERIN 0.4 MG SL tablet Commonly known as: NITROSTAT Place 1 tablet (0.4 mg total) under the tongue every 5 (five) minutes as needed for chest pain.   Vitamin D3 25 MCG (1000 UT) Caps Take 1,000 Units by mouth daily.       Past Surgical History:  She  has a past surgical history that includes Abdominal hysterectomy (1991); Hemorrhoid surgery; Hand surgery; Foot surgery; Breast surgery (1988); Bartholin gland cyst excision (1980); Oophorectomy; Colonoscopy; Colonoscopy (N/A, 08/24/2013); Olecranon bursectomy (Right, 11/29/2013); Tenosynovectomy (Right, 11/29/2013); Foot surgery (Left); and Weil osteotomy (Right, 08/10/2018).  Family History:  Her family history includes Cancer in her brother, mother, sister, and sister; Hypertension in her sister.  Social History:  She  reports that she quit smoking about 14 years ago. Her smoking use included cigarettes. She has a 24.00 pack-year smoking history. She has never used smokeless tobacco. She reports that she does not drink alcohol or use drugs.

## 2019-05-04 ENCOUNTER — Encounter: Payer: Self-pay | Admitting: Gynecology

## 2019-05-12 ENCOUNTER — Telehealth: Payer: Self-pay | Admitting: Pulmonary Disease

## 2019-05-12 NOTE — Telephone Encounter (Signed)
Called the patient and advised of her the results noted below. Patient voiced understanding, nothing further needed at this time.

## 2019-05-12 NOTE — Telephone Encounter (Signed)
CLINICAL DATA:  Follow-up interstitial lung disease and lung nodule. History of hypertension.  EXAM: CHEST - 2 VIEW  COMPARISON:  Chest radiographs, 04/21/2018.  Chest CT, 05/23/2009.  FINDINGS: Cardiac silhouette is normal in size. No mediastinal or hilar masses. No evidence of adenopathy.  Lungs are hyperexpanded. Mild prominence of the bronchovascular interstitial markings. No evidence of pneumonia or pulmonary edema. No lung mass or nodule.  No pleural effusion or pneumothorax.  Skeletal structures are intact.  IMPRESSION: 1. No acute cardiopulmonary disease. 2. Chronic lung changes with lung hyperexpansion, prominent bronchovascular markings and mild interstitial thickening. 3. No visible lung nodules. Small lung nodules noted on the prior chest CT are not resolved radiographically.   Electronically Signed   By: Lajean Manes M.D.   On: 04/28/2019 13:30   Please let her know that her chest xray shows stable changes with scarring in lungs related to history of rheumatoid arthritis.  These changes are very mild.  No additional interventions needed at this time unless she has more trouble with her breathing.

## 2019-05-16 ENCOUNTER — Telehealth: Payer: Self-pay | Admitting: Pulmonary Disease

## 2019-05-16 NOTE — Telephone Encounter (Signed)
Called and spoke with Patient.  Patient requested her PCP, Dr Edwinna Areola. Hall in [place], receive a copy of her cxr.  CXR faxed to Dr. Nevada Crane, [phone], confirmation received.  Nothing further at this time.

## 2019-05-23 DIAGNOSIS — D696 Thrombocytopenia, unspecified: Secondary | ICD-10-CM | POA: Diagnosis not present

## 2019-05-23 DIAGNOSIS — N182 Chronic kidney disease, stage 2 (mild): Secondary | ICD-10-CM | POA: Diagnosis not present

## 2019-05-23 DIAGNOSIS — R69 Illness, unspecified: Secondary | ICD-10-CM | POA: Diagnosis not present

## 2019-05-23 DIAGNOSIS — I10 Essential (primary) hypertension: Secondary | ICD-10-CM | POA: Diagnosis not present

## 2019-05-23 DIAGNOSIS — M0600 Rheumatoid arthritis without rheumatoid factor, unspecified site: Secondary | ICD-10-CM | POA: Diagnosis not present

## 2019-05-23 DIAGNOSIS — M8580 Other specified disorders of bone density and structure, unspecified site: Secondary | ICD-10-CM | POA: Diagnosis not present

## 2019-05-26 ENCOUNTER — Other Ambulatory Visit (HOSPITAL_COMMUNITY): Payer: Self-pay | Admitting: Internal Medicine

## 2019-05-26 DIAGNOSIS — Z1231 Encounter for screening mammogram for malignant neoplasm of breast: Secondary | ICD-10-CM

## 2019-05-31 DIAGNOSIS — Z6826 Body mass index (BMI) 26.0-26.9, adult: Secondary | ICD-10-CM | POA: Diagnosis not present

## 2019-05-31 DIAGNOSIS — M0579 Rheumatoid arthritis with rheumatoid factor of multiple sites without organ or systems involvement: Secondary | ICD-10-CM | POA: Diagnosis not present

## 2019-05-31 DIAGNOSIS — E663 Overweight: Secondary | ICD-10-CM | POA: Diagnosis not present

## 2019-06-16 ENCOUNTER — Ambulatory Visit (HOSPITAL_COMMUNITY)
Admission: RE | Admit: 2019-06-16 | Discharge: 2019-06-16 | Disposition: A | Discharge status: Home / Self Care | Payer: Medicare HMO | Source: Ambulatory Visit | Attending: Internal Medicine | Admitting: Internal Medicine

## 2019-06-16 ENCOUNTER — Other Ambulatory Visit: Payer: Self-pay

## 2019-06-16 DIAGNOSIS — Z1231 Encounter for screening mammogram for malignant neoplasm of breast: Secondary | ICD-10-CM | POA: Diagnosis not present

## 2019-06-19 DIAGNOSIS — I10 Essential (primary) hypertension: Secondary | ICD-10-CM | POA: Diagnosis not present

## 2019-06-19 DIAGNOSIS — D649 Anemia, unspecified: Secondary | ICD-10-CM | POA: Diagnosis not present

## 2019-06-19 DIAGNOSIS — N182 Chronic kidney disease, stage 2 (mild): Secondary | ICD-10-CM | POA: Diagnosis not present

## 2019-06-29 DIAGNOSIS — I10 Essential (primary) hypertension: Secondary | ICD-10-CM | POA: Diagnosis not present

## 2019-06-29 DIAGNOSIS — N182 Chronic kidney disease, stage 2 (mild): Secondary | ICD-10-CM | POA: Diagnosis not present

## 2019-06-29 DIAGNOSIS — Z23 Encounter for immunization: Secondary | ICD-10-CM | POA: Diagnosis not present

## 2019-06-29 DIAGNOSIS — R69 Illness, unspecified: Secondary | ICD-10-CM | POA: Diagnosis not present

## 2019-06-29 DIAGNOSIS — M0600 Rheumatoid arthritis without rheumatoid factor, unspecified site: Secondary | ICD-10-CM | POA: Diagnosis not present

## 2019-06-29 DIAGNOSIS — R911 Solitary pulmonary nodule: Secondary | ICD-10-CM | POA: Diagnosis not present

## 2019-06-29 DIAGNOSIS — Z0001 Encounter for general adult medical examination with abnormal findings: Secondary | ICD-10-CM | POA: Diagnosis not present

## 2019-06-29 DIAGNOSIS — D696 Thrombocytopenia, unspecified: Secondary | ICD-10-CM | POA: Diagnosis not present

## 2019-06-29 DIAGNOSIS — M8580 Other specified disorders of bone density and structure, unspecified site: Secondary | ICD-10-CM | POA: Diagnosis not present

## 2019-07-07 DIAGNOSIS — R69 Illness, unspecified: Secondary | ICD-10-CM | POA: Diagnosis not present

## 2019-07-07 DIAGNOSIS — M0600 Rheumatoid arthritis without rheumatoid factor, unspecified site: Secondary | ICD-10-CM | POA: Diagnosis not present

## 2019-07-07 DIAGNOSIS — N189 Chronic kidney disease, unspecified: Secondary | ICD-10-CM | POA: Diagnosis not present

## 2019-07-07 DIAGNOSIS — Z23 Encounter for immunization: Secondary | ICD-10-CM | POA: Diagnosis not present

## 2019-07-07 DIAGNOSIS — D696 Thrombocytopenia, unspecified: Secondary | ICD-10-CM | POA: Diagnosis not present

## 2019-07-07 DIAGNOSIS — M8580 Other specified disorders of bone density and structure, unspecified site: Secondary | ICD-10-CM | POA: Diagnosis not present

## 2019-07-07 DIAGNOSIS — I129 Hypertensive chronic kidney disease with stage 1 through stage 4 chronic kidney disease, or unspecified chronic kidney disease: Secondary | ICD-10-CM | POA: Diagnosis not present

## 2019-07-07 DIAGNOSIS — Z0001 Encounter for general adult medical examination with abnormal findings: Secondary | ICD-10-CM | POA: Diagnosis not present

## 2019-07-07 DIAGNOSIS — N182 Chronic kidney disease, stage 2 (mild): Secondary | ICD-10-CM | POA: Diagnosis not present

## 2019-07-07 DIAGNOSIS — I10 Essential (primary) hypertension: Secondary | ICD-10-CM | POA: Diagnosis not present

## 2019-07-27 DIAGNOSIS — R5383 Other fatigue: Secondary | ICD-10-CM | POA: Diagnosis not present

## 2019-07-27 DIAGNOSIS — N182 Chronic kidney disease, stage 2 (mild): Secondary | ICD-10-CM | POA: Diagnosis not present

## 2019-07-27 DIAGNOSIS — I452 Bifascicular block: Secondary | ICD-10-CM | POA: Diagnosis not present

## 2019-07-27 DIAGNOSIS — R911 Solitary pulmonary nodule: Secondary | ICD-10-CM | POA: Diagnosis not present

## 2019-07-27 DIAGNOSIS — M0600 Rheumatoid arthritis without rheumatoid factor, unspecified site: Secondary | ICD-10-CM | POA: Diagnosis not present

## 2019-07-27 DIAGNOSIS — R944 Abnormal results of kidney function studies: Secondary | ICD-10-CM | POA: Diagnosis not present

## 2019-07-27 DIAGNOSIS — N390 Urinary tract infection, site not specified: Secondary | ICD-10-CM | POA: Diagnosis not present

## 2019-07-27 DIAGNOSIS — I10 Essential (primary) hypertension: Secondary | ICD-10-CM | POA: Diagnosis not present

## 2019-07-27 DIAGNOSIS — D649 Anemia, unspecified: Secondary | ICD-10-CM | POA: Diagnosis not present

## 2019-07-27 DIAGNOSIS — I4510 Unspecified right bundle-branch block: Secondary | ICD-10-CM | POA: Diagnosis not present

## 2019-07-27 DIAGNOSIS — D72819 Decreased white blood cell count, unspecified: Secondary | ICD-10-CM | POA: Diagnosis not present

## 2019-12-14 ENCOUNTER — Other Ambulatory Visit (HOSPITAL_COMMUNITY): Payer: Self-pay | Admitting: Adult Health Nurse Practitioner

## 2019-12-14 DIAGNOSIS — Z13820 Encounter for screening for osteoporosis: Secondary | ICD-10-CM

## 2020-02-05 ENCOUNTER — Other Ambulatory Visit: Payer: Self-pay

## 2020-02-05 ENCOUNTER — Encounter: Payer: Self-pay | Admitting: Cardiology

## 2020-02-05 ENCOUNTER — Ambulatory Visit: Payer: Medicare HMO | Admitting: Cardiology

## 2020-02-05 VITALS — BP 112/66 | HR 69 | Ht 67.0 in | Wt 161.6 lb

## 2020-02-05 DIAGNOSIS — I4510 Unspecified right bundle-branch block: Secondary | ICD-10-CM | POA: Diagnosis not present

## 2020-02-05 DIAGNOSIS — I10 Essential (primary) hypertension: Secondary | ICD-10-CM | POA: Diagnosis not present

## 2020-02-05 DIAGNOSIS — M069 Rheumatoid arthritis, unspecified: Secondary | ICD-10-CM | POA: Diagnosis not present

## 2020-02-05 DIAGNOSIS — I444 Left anterior fascicular block: Secondary | ICD-10-CM

## 2020-02-05 NOTE — Patient Instructions (Signed)
Medication Instructions:  The current medical regimen is effective;  continue present plan and medications.  *If you need a refill on your cardiac medications before your next appointment, please call your pharmacy*  Follow-Up: At [facility], you and your health needs are our priority.  As part of our continuing mission to provide you with exceptional heart care, we have created designated Provider Care Teams.  These Care Teams include your primary Cardiologist (physician) and Advanced Practice Providers (APPs -  Physician Assistants and Nurse Practitioners) who all work together to provide you with the care you need, when you need it.  We recommend signing up for the patient portal called "MyChart".  Sign up information is provided on this After Visit Summary.  MyChart is used to connect with patients for Virtual Visits (Telemedicine).  Patients are able to view lab/test results, encounter notes, upcoming appointments, etc.  Non-urgent messages can be sent to your provider as well.   To learn more about what you can do with MyChart, go to [email or web address].    Your next appointment:   12 month(s)  The format for your next appointment:   In Person  Provider:   Mark Skains, MD   Thank you for choosing [facility]!!      

## 2020-02-05 NOTE — Progress Notes (Signed)
Cardiology Office Note:    Date:  02/05/2020   ID:  Bethany Grant, DOB 02-Jul-1941, MRN [id number]  PCP:  Celene Squibb, MD  [facility] Cardiologist:  Candee Furbish, MD  [facility] Electrophysiologist:  None   Referring MD: Celene Squibb, MD     History of Present Illness:    Bethany Grant is a 79 y.o. female here for follow-up of bifascicular block, right bundle branch block, left anterior fascicular block, hyperlipidemia hypertension rheumatoid arthritis interstitial lung disease.  Prior atypical chest pain was assessed with nuclear stress test that was reassuring, no ischemia.  No further CP. BP med has been cut in half.   Past Medical History:  Diagnosis Date  . Hypercholesterolemia   . Hypertension   . Osteopenia 09/2017   T score -2.1 FRAX 18% / 2.7%  . PONV (postoperative nausea and vomiting)   . Rheumatoid arthritis(714.0)     Past Surgical History:  Procedure Laterality Date  . ABDOMINAL HYSTERECTOMY  1991   TAH/BSO  . BARTHOLIN GLAND CYST EXCISION  1980   RIGHT  . BREAST SURGERY  1988   BREAST CYST  . COLONOSCOPY    . COLONOSCOPY N/A 08/24/2013   Procedure: COLONOSCOPY;  Surgeon: Rogene Houston, MD;  [facility];  Service: Endoscopy;  Laterality: N/A;  100  . FOOT SURGERY     2 TIMES  . FOOT SURGERY Left   . HAND SURGERY    . HEMORRHOID SURGERY     been over 20 years ago  . OLECRANON BURSECTOMY Right 11/29/2013   Procedure: EXCISION OLECRANON BURSA RIGHT ;  Surgeon: Wynonia Sours, MD;  Location: [place];  Service: Orthopedics;  Laterality: Right;  . OOPHORECTOMY     BSO  . TENOSYNOVECTOMY Right 11/29/2013   Procedure: TENOSYNOVECTOMY FLEXOR TENDON RIGHT WRIST ;  Surgeon: Wynonia Sours, MD;  Location: [place];  Service: Orthopedics;  Laterality: Right;  . WEIL OSTEOTOMY Right 08/10/2018   Procedure: WEIL OSTEOTOMY 2, 3,4 METATARSAL RIGHT FOOT, PROXIMAL INTERPHALANGEAL RESECTION TOES 2, 3, 4;  Surgeon: Newt Minion, MD;  Location: [place];  Service: Orthopedics;  Laterality: Right;    Current Medications: Current Meds  Medication Sig  . ALPRAZolam (XANAX) 0.25 MG tablet Take 0.25 mg by mouth at bedtime.   . Cholecalciferol (VITAMIN D3) 25 MCG (1000 UT) CAPS Take 1,000 Units by mouth daily.  . ciclopirox (LOPROX) 0.77 % cream as needed.  . cycloSPORINE (RESTASIS) 0.05 % ophthalmic emulsion Place 1 drop into both eyes 2 (two) times daily.   . fluticasone (KLS ALLER-FLO) 50 MCG/ACT nasal spray Place 1 spray into both nostrils daily as needed for allergies or rhinitis.  . folic acid (FOLVITE) 1 MG tablet Take 1 mg by mouth daily.  . hydroxychloroquine (PLAQUENIL) 200 MG tablet Take 400 mg by mouth daily.   Marland Kitchen ibuprofen (ADVIL,MOTRIN) 200 MG tablet Take 200 mg by mouth every 6 (six) hours as needed for headache or moderate pain.  Marland Kitchen losartan-hydrochlorothiazide (HYZAAR) 100-12.5 MG per tablet Take 0.5 tablets by mouth daily.   . methotrexate (RHEUMATREX) 2.5 MG tablet Take 2.5 mg by mouth See admin instructions. Take 7.5 mg by mouth daily on Friday and take 7.5 mg by mouth daily on Saturday.  . mupirocin ointment (BACTROBAN) 2 % Apply 1 application topically daily.     Allergies:   Other   Social History   Socioeconomic History  . Marital status: Married  Spouse name: Not on file  . Number of children: Not on file  . Years of education: Not on file  . Highest education level: Not on file  Occupational History  . Not on file  Tobacco Use  . Smoking status: Former Smoker    Packs/day: 0.80    Years: 30.00    Pack years: 24.00    Types: Cigarettes    Quit date: 07/27/2004    Years since quitting: 15.5  . Smokeless tobacco: Never Used  Vaping Use  . Vaping Use: Never used  Substance and Sexual Activity  . Alcohol use: No    Alcohol/week: 0.0 standard drinks  . Drug use: No  . Sexual activity: Not Currently    Birth control/protection: Surgical    Comment: 1st intercourse 79 yo-Fewer  than 5 partners  Other Topics Concern  . Not on file  Social History Narrative  . Not on file   Social Determinants of Health   Financial Resource Strain:   . Difficulty of Paying Living Expenses:   Food Insecurity:   . Worried About Charity fundraiser in the Last Year:   . Arboriculturist in the Last Year:   Transportation Needs:   . Film/video editor (Medical):   Marland Kitchen Lack of Transportation (Non-Medical):   Physical Activity:   . Days of Exercise per Week:   . Minutes of Exercise per Session:   Stress:   . Feeling of Stress :   Social Connections:   . Frequency of Communication with Friends and Family:   . Frequency of Social Gatherings with Friends and Family:   . Attends Religious Services:   . Active Member of Clubs or Organizations:   . Attends Archivist Meetings:   Marland Kitchen Marital Status:      Family History: The patient's family history includes Cancer in her brother, mother, sister, and sister; Hypertension in her sister. There is no history of Colon cancer.  ROS:   Please see the history of present illness.     All other systems reviewed and are negative.  EKGs/Labs/Other Studies Reviewed:    EKG:  EKG is  ordered today.  The ekg ordered today demonstrates sinus rhythm 69 with right bundle branch block, left anterior fascicular block  Recent Labs: No results found for requested labs within last 8760 hours.  Recent Lipid Panel No results found for: CHOL, TRIG, HDL, CHOLHDL, VLDL, LDLCALC, LDLDIRECT  Physical Exam:    VS:  BP 112/66   Pulse 69   Ht 5\' 7"  (1.702 m)   Wt 161 lb 9.6 oz (73.3 kg)   LMP  (LMP Unknown)   SpO2 94%   BMI 25.31 kg/m     Wt Readings from Last 3 Encounters:  02/05/20 161 lb 9.6 oz (73.3 kg)  04/28/19 170 lb 6.4 oz (77.3 kg)  01/05/19 169 lb (76.7 kg)     GEN:  Well nourished, well developed in no acute distress HEENT: Normal NECK: No JVD; No carotid bruits LYMPHATICS: No lymphadenopathy CARDIAC: RRR, no murmurs,  rubs, gallops RESPIRATORY:  Clear to auscultation without rales, wheezing or rhonchi  ABDOMEN: Soft, non-tender, non-distended MUSCULOSKELETAL:  No edema; No deformity  SKIN: Warm and dry NEUROLOGIC:  Alert and oriented x 3 PSYCHIATRIC:  Normal affect   ASSESSMENT:    1. LAFB (left anterior fascicular block)   2. RBBB   3. Essential hypertension   4. Rheumatoid arthritis, involving unspecified site, unspecified whether rheumatoid factor present (  [place])    PLAN:    In order of problems listed above:  Atypical chest pain -Reassuring nuclear stress test  Essential hypertension -Dr. Nevada Crane had decreased her losartan to half a tablet a day.  BP today excellent 112/66.  Bifascicular block/right bundle branch block/left anterior fascicular block -Conduction disease noted.  Trying to avoid AV nodal blocking agents.  No syncope.  May have pacemaker in her future.  Fatigue -Multifactorial.  Continue to promote exercise.  Interstitial lung disease -Followed by pulmonary medicine.  Chest x-ray results have been reviewed.  Rheumatoid arthritis -Methotrexate, medications reviewed.  On Plaquenil.  Continue to encourage movement, exercise.  She states that she is doing well.  Sinus bradycardia -Previously noted on EKG in 2018.  Chronotropic competence has been demonstrated.   Medication Adjustments/Labs and Tests Ordered: Current medicines are reviewed at length with the patient today.  Concerns regarding medicines are outlined above.  Orders Placed This Encounter  Procedures  . EKG 12-Lead   No orders of the defined types were placed in this encounter.   Patient Instructions  Medication Instructions:  The current medical regimen is effective;  continue present plan and medications.  *If you need a refill on your cardiac medications before your next appointment, please call your pharmacy*  Follow-Up: At [facility], you and your health needs are our priority.  As part of our  continuing mission to provide you with exceptional heart care, we have created designated Provider Care Teams.  These Care Teams include your primary Cardiologist (physician) and Advanced Practice Providers (APPs -  Physician Assistants and Nurse Practitioners) who all work together to provide you with the care you need, when you need it.  We recommend signing up for the patient portal called "MyChart".  Sign up information is provided on this After Visit Summary.  MyChart is used to connect with patients for Virtual Visits (Telemedicine).  Patients are able to view lab/test results, encounter notes, upcoming appointments, etc.  Non-urgent messages can be sent to your provider as well.   To learn more about what you can do with MyChart, go to [email or web address].    Your next appointment:   12 month(s)  The format for your next appointment:   In Person  Provider:   Candee Furbish, MD  Thank you for choosing [facility]!!        Signed, Candee Furbish, MD  02/05/2020 10:01 AM    [place]

## 2020-05-15 ENCOUNTER — Other Ambulatory Visit (HOSPITAL_COMMUNITY): Payer: Self-pay | Admitting: Internal Medicine

## 2020-05-15 DIAGNOSIS — Z1231 Encounter for screening mammogram for malignant neoplasm of breast: Secondary | ICD-10-CM

## 2020-05-21 ENCOUNTER — Other Ambulatory Visit (HOSPITAL_COMMUNITY): Payer: Medicare HMO

## 2020-05-23 ENCOUNTER — Ambulatory Visit (HOSPITAL_COMMUNITY)
Admission: RE | Admit: 2020-05-23 | Discharge: 2020-05-23 | Disposition: A | Discharge status: Home / Self Care | Payer: Medicare HMO | Source: Ambulatory Visit | Attending: Adult Health Nurse Practitioner | Admitting: Adult Health Nurse Practitioner

## 2020-05-23 ENCOUNTER — Other Ambulatory Visit: Payer: Self-pay

## 2020-05-23 DIAGNOSIS — M8580 Other specified disorders of bone density and structure, unspecified site: Secondary | ICD-10-CM | POA: Insufficient documentation

## 2020-05-23 DIAGNOSIS — M069 Rheumatoid arthritis, unspecified: Secondary | ICD-10-CM | POA: Insufficient documentation

## 2020-05-23 DIAGNOSIS — Z780 Asymptomatic menopausal state: Secondary | ICD-10-CM | POA: Insufficient documentation

## 2020-05-23 DIAGNOSIS — Z13820 Encounter for screening for osteoporosis: Secondary | ICD-10-CM

## 2020-06-24 ENCOUNTER — Other Ambulatory Visit: Payer: Self-pay

## 2020-06-24 ENCOUNTER — Ambulatory Visit (HOSPITAL_COMMUNITY)
Admission: RE | Admit: 2020-06-24 | Discharge: 2020-06-24 | Disposition: A | Discharge status: Home / Self Care | Payer: Medicare HMO | Source: Ambulatory Visit | Attending: Internal Medicine | Admitting: Internal Medicine

## 2020-06-24 DIAGNOSIS — Z1231 Encounter for screening mammogram for malignant neoplasm of breast: Secondary | ICD-10-CM

## 2020-06-26 ENCOUNTER — Other Ambulatory Visit: Payer: Self-pay

## 2020-06-26 ENCOUNTER — Encounter (HOSPITAL_BASED_OUTPATIENT_CLINIC_OR_DEPARTMENT_OTHER): Payer: Self-pay

## 2020-06-26 ENCOUNTER — Emergency Department (HOSPITAL_BASED_OUTPATIENT_CLINIC_OR_DEPARTMENT_OTHER): Payer: Medicare HMO

## 2020-06-26 ENCOUNTER — Emergency Department (HOSPITAL_BASED_OUTPATIENT_CLINIC_OR_DEPARTMENT_OTHER)
Admission: EM | Admit: 2020-06-26 | Discharge: 2020-06-26 | Disposition: A | Discharge status: Home / Self Care | Payer: Medicare HMO | Attending: Emergency Medicine | Admitting: Emergency Medicine

## 2020-06-26 DIAGNOSIS — E039 Hypothyroidism, unspecified: Secondary | ICD-10-CM | POA: Insufficient documentation

## 2020-06-26 DIAGNOSIS — Z20822 Contact with and (suspected) exposure to covid-19: Secondary | ICD-10-CM | POA: Diagnosis not present

## 2020-06-26 DIAGNOSIS — R197 Diarrhea, unspecified: Secondary | ICD-10-CM | POA: Insufficient documentation

## 2020-06-26 DIAGNOSIS — I700 Atherosclerosis of aorta: Secondary | ICD-10-CM | POA: Insufficient documentation

## 2020-06-26 DIAGNOSIS — E860 Dehydration: Secondary | ICD-10-CM

## 2020-06-26 DIAGNOSIS — R140 Abdominal distension (gaseous): Secondary | ICD-10-CM | POA: Diagnosis not present

## 2020-06-26 DIAGNOSIS — I10 Essential (primary) hypertension: Secondary | ICD-10-CM | POA: Insufficient documentation

## 2020-06-26 DIAGNOSIS — Z79899 Other long term (current) drug therapy: Secondary | ICD-10-CM | POA: Insufficient documentation

## 2020-06-26 DIAGNOSIS — R112 Nausea with vomiting, unspecified: Secondary | ICD-10-CM | POA: Diagnosis not present

## 2020-06-26 DIAGNOSIS — Z87891 Personal history of nicotine dependence: Secondary | ICD-10-CM | POA: Diagnosis not present

## 2020-06-26 DIAGNOSIS — L299 Pruritus, unspecified: Secondary | ICD-10-CM | POA: Diagnosis not present

## 2020-06-26 DIAGNOSIS — R918 Other nonspecific abnormal finding of lung field: Secondary | ICD-10-CM | POA: Insufficient documentation

## 2020-06-26 DIAGNOSIS — R531 Weakness: Secondary | ICD-10-CM | POA: Diagnosis present

## 2020-06-26 DIAGNOSIS — K529 Noninfective gastroenteritis and colitis, unspecified: Secondary | ICD-10-CM

## 2020-06-26 LAB — COMPREHENSIVE METABOLIC PANEL
ALT: 17 U/L (ref 0–44)
AST: 24 U/L (ref 15–41)
Albumin: 3.2 g/dL — ABNORMAL LOW (ref 3.5–5.0)
Alkaline Phosphatase: 58 U/L (ref 38–126)
Anion gap: 8 (ref 5–15)
BUN: 17 mg/dL (ref 8–23)
CO2: 23 mmol/L (ref 22–32)
Calcium: 8.2 mg/dL — ABNORMAL LOW (ref 8.9–10.3)
Chloride: 98 mmol/L (ref 98–111)
Creatinine, Ser: 1.14 mg/dL — ABNORMAL HIGH (ref 0.44–1.00)
GFR, Estimated: 50 mL/min — ABNORMAL LOW (ref >60)
Glucose, Bld: 104 mg/dL — ABNORMAL HIGH (ref 70–99)
Potassium: 4.1 mmol/L (ref 3.5–5.1)
Sodium: 129 mmol/L — ABNORMAL LOW (ref 135–145)
Total Bilirubin: 0.8 mg/dL (ref 0.3–1.2)
Total Protein: 6.2 g/dL — ABNORMAL LOW (ref 6.5–8.1)

## 2020-06-26 LAB — PROTIME-INR
INR: 1.2 (ref 0.8–1.2)
Prothrombin Time: 15 seconds (ref 11.4–15.2)

## 2020-06-26 LAB — CBC WITH DIFFERENTIAL/PLATELET
Abs Immature Granulocytes: 0.02 10*3/uL (ref 0.00–0.07)
Basophils Absolute: 0 10*3/uL (ref 0.0–0.1)
Basophils Relative: 0 %
Eosinophils Absolute: 0 10*3/uL (ref 0.0–0.5)
Eosinophils Relative: 1 %
HCT: 32.8 % — ABNORMAL LOW (ref 36.0–46.0)
Hemoglobin: 10.8 g/dL — ABNORMAL LOW (ref 12.0–15.0)
Immature Granulocytes: 0 %
Lymphocytes Relative: 3 %
Lymphs Abs: 0.1 10*3/uL — ABNORMAL LOW (ref 0.7–4.0)
MCH: 30.5 pg (ref 26.0–34.0)
MCHC: 32.9 g/dL (ref 30.0–36.0)
MCV: 92.7 fL (ref 80.0–100.0)
Monocytes Absolute: 0.5 10*3/uL (ref 0.1–1.0)
Monocytes Relative: 9 %
Neutro Abs: 4.7 10*3/uL (ref 1.7–7.7)
Neutrophils Relative %: 87 %
Platelets: 167 10*3/uL (ref 150–400)
RBC: 3.54 MIL/uL — ABNORMAL LOW (ref 3.87–5.11)
RDW: 15.4 % (ref 11.5–15.5)
WBC: 5.4 10*3/uL (ref 4.0–10.5)
nRBC: 0 % (ref 0.0–0.2)

## 2020-06-26 LAB — RESP PANEL BY RT-PCR (FLU A&B, COVID) ARPGX2
Influenza A by PCR: NEGATIVE
Influenza B by PCR: NEGATIVE
SARS Coronavirus 2 by RT PCR: NEGATIVE

## 2020-06-26 LAB — LACTIC ACID, PLASMA: Lactic Acid, Venous: 1.3 mmol/L (ref 0.5–1.9)

## 2020-06-26 LAB — URINALYSIS, ROUTINE W REFLEX MICROSCOPIC
Bilirubin Urine: NEGATIVE
Glucose, UA: NEGATIVE mg/dL
Hgb urine dipstick: NEGATIVE
Ketones, ur: NEGATIVE mg/dL
Leukocytes,Ua: NEGATIVE
Nitrite: NEGATIVE
Protein, ur: NEGATIVE mg/dL
Specific Gravity, Urine: <1.005 — ABNORMAL LOW (ref 1.005–1.030)
pH: 6 (ref 5.0–8.0)

## 2020-06-26 LAB — T4, FREE: Free T4: 1.38 ng/dL — ABNORMAL HIGH (ref 0.61–1.12)

## 2020-06-26 LAB — CBG MONITORING, ED: Glucose-Capillary: 87 mg/dL (ref 70–99)

## 2020-06-26 LAB — APTT: aPTT: 32 seconds (ref 24–36)

## 2020-06-26 LAB — TSH: TSH: 2.128 u[IU]/mL (ref 0.350–4.500)

## 2020-06-26 MED ORDER — VANCOMYCIN HCL IN DEXTROSE 1-5 GM/200ML-% IV SOLN: 1000.0000 mg | INTRAVENOUS | Status: DC

## 2020-06-26 MED ORDER — ACETAMINOPHEN 500 MG PO TABS
1000.0000 mg | ORAL_TABLET | Freq: Once | ORAL | Status: AC
  Administered 2020-06-26: 1000 mg via ORAL
  Filled 2020-06-26: qty 2

## 2020-06-26 MED ORDER — SODIUM CHLORIDE 0.9 % IV SOLN: 2.0000 g | Freq: Two times a day (BID) | INTRAVENOUS | Status: DC

## 2020-06-26 MED ORDER — SODIUM CHLORIDE 0.9 % IV SOLN: INTRAVENOUS | Status: DC | PRN

## 2020-06-26 MED ORDER — LACTATED RINGERS IV BOLUS (SEPSIS)
1000.0000 mL | Freq: Once | INTRAVENOUS | Status: AC
  Administered 2020-06-26: 1000 mL via INTRAVENOUS

## 2020-06-26 MED ORDER — CIPROFLOXACIN HCL 500 MG PO TABS: 500.0000 mg | ORAL_TABLET | Freq: Two times a day (BID) | ORAL | 0 refills | Status: DC

## 2020-06-26 MED ORDER — SODIUM CHLORIDE 0.9 % IV SOLN: 2.0000 g | Freq: Once | INTRAVENOUS | Status: AC

## 2020-06-26 MED ORDER — DIPHENHYDRAMINE HCL 50 MG/ML IJ SOLN
25.0000 mg | Freq: Once | INTRAMUSCULAR | Status: AC
  Administered 2020-06-26: 25 mg via INTRAVENOUS
  Filled 2020-06-26: qty 1

## 2020-06-26 MED ORDER — IOHEXOL 300 MG/ML  SOLN
100.0000 mL | Freq: Once | INTRAMUSCULAR | Status: AC | PRN
  Administered 2020-06-26: 80 mL via INTRAVENOUS

## 2020-06-26 MED ORDER — VANCOMYCIN HCL IN DEXTROSE 1-5 GM/200ML-% IV SOLN
1000.0000 mg | Freq: Once | INTRAVENOUS | Status: AC
  Administered 2020-06-26: 1000 mg via INTRAVENOUS
  Filled 2020-06-26: qty 200

## 2020-06-26 MED ORDER — SODIUM CHLORIDE 0.9 % IV SOLN
INTRAVENOUS | Status: AC
  Administered 2020-06-26: 2 g via INTRAVENOUS
  Filled 2020-06-26: qty 2

## 2020-06-26 MED ORDER — PIPERACILLIN-TAZOBACTAM 3.375 G IVPB: 3.3750 g | Freq: Four times a day (QID) | INTRAVENOUS | Status: DC

## 2020-06-26 MED ORDER — METRONIDAZOLE 500 MG PO TABS: 500.0000 mg | ORAL_TABLET | Freq: Two times a day (BID) | ORAL | 0 refills | Status: DC

## 2020-06-26 MED ORDER — ONDANSETRON 4 MG PO TBDP: ORAL_TABLET | ORAL | 0 refills | Status: DC

## 2020-06-26 MED ORDER — LACTATED RINGERS IV SOLN: INTRAVENOUS | Status: DC

## 2020-06-26 MED ORDER — LACTATED RINGERS IV BOLUS (SEPSIS)
250.0000 mL | Freq: Once | INTRAVENOUS | Status: AC
  Administered 2020-06-26: 250 mL via INTRAVENOUS

## 2020-06-26 NOTE — ED Notes (Signed)
Dr. Darl Householder gave husband permission to give patient her home dose of .5 mg Alprazolam.

## 2020-06-26 NOTE — ED Notes (Signed)
Pt on monitor and vitals cycling 

## 2020-06-26 NOTE — ED Provider Notes (Signed)
  Physical Exam  BP (!) 103/51 (BP Location: Right Arm)   Pulse 67   Temp 99.7 F (37.6 C) (Rectal)   Resp 20   Ht 5\' 7"  (1.702 m)   Wt 70.3 kg   LMP  (LMP Unknown)   SpO2 96%   BMI 24.28 kg/m   Physical Exam  ED Course/Procedures     Procedures  MDM  Care assumed at 3 PM.  Patient is here with fever and tachycardia and gastroenteritis symptoms.  Patient meets SIRS criteria initially so sepsis protocol was initiated.  However no source was found but urinalysis and Covid test is pending.   6:56 PM UA and Covid is negative.  White blood cell count is normal and lactate is normal.  Given gastroenteritis symptoms, I ordered CT abdomen pelvis which showed ileus versus gastroenteritis.  I think this is likely a viral process but given the fever initially and possible sepsis, will discharge home with Cipro and Flagyl.  We will also give Zofran as needed.  Patient tolerated p.o. in the ED.       Drenda Freeze, MD 06/26/20 [phone]

## 2020-06-26 NOTE — ED Notes (Signed)
Pt up to bedside commode without difficulty.

## 2020-06-26 NOTE — Sepsis Progress Note (Signed)
Elink monitoring this Code Sepsis. 

## 2020-06-26 NOTE — ED Provider Notes (Signed)
[place] EMERGENCY DEPARTMENT Provider Note   CSN: [id number] Arrival date & time: 06/26/20  1135     History Chief Complaint  Patient presents with  . Weakness  . Emesis    Bethany Grant is a 79 y.o. female.  HPI     79 year old female with history of hypertension, hypercholesterolemia, rheumatoid arthritis, presents with concern for generalized weakness, nausea, vomiting and diarrhea.  Reports that she has been feeling sick for 3 weeks with diarrhea and generalized weakness, however since Thanksgiving has felt significantly worse with associated nausea vomiting and severe generalized weakness.  Husband reports that today she was having difficulty getting up and walking due to weakness.  She reports that she has had itching for months and they do not know why.  She was started on thyroid medication for hypothyroidism at the beginning of November, but felt worse after she took the medication and stopped.  She reports approximately 3 episodes of vomiting per day over the last couple of days.  Reports the vomit appeared dark. denies any abdominal pain.  Reports she felt as though she may have a fever but did not know of a fever at home.  Denies sore throat, cough, congestion, chest pain, shortness of breath, black or bloody stools.  Past Medical History:  Diagnosis Date  . Hypercholesterolemia   . Hypertension   . Osteopenia 09/2017   T score -2.1 FRAX 18% / 2.7%  . PONV (postoperative nausea and vomiting)   . Rheumatoid arthritis(714.0)     Patient Active Problem List   Diagnosis Date Noted  . Claw toe, acquired, right   . Cough 04/21/2018  . Bronchitis 04/21/2018  . Abnormal EKG 12/20/2014  . Other fatigue 12/20/2014  . RBBB 12/20/2013  . LAFB (left anterior fascicular block) 12/20/2013  . Bifascicular block 12/20/2013  . RA (rheumatoid arthritis) ([place]) 12/20/2013  . Rheumatoid lung disease ([place]) 10/12/2013  . Osteopenia 06/14/2013  . Hypertension   .  RHINITIS 02/25/2009  . ARTHRITIS, RHEUMATOID 05/03/2008    Past Surgical History:  Procedure Laterality Date  . ABDOMINAL HYSTERECTOMY  1991   TAH/BSO  . BARTHOLIN GLAND CYST EXCISION  1980   RIGHT  . BREAST SURGERY  1988   BREAST CYST  . COLONOSCOPY    . COLONOSCOPY N/A 08/24/2013   Procedure: COLONOSCOPY;  Surgeon: Rogene Houston, MD;  [facility];  Service: Endoscopy;  Laterality: N/A;  100  . FOOT SURGERY     2 TIMES  . FOOT SURGERY Left   . HAND SURGERY    . HEMORRHOID SURGERY     been over 20 years ago  . OLECRANON BURSECTOMY Right 11/29/2013   Procedure: EXCISION OLECRANON BURSA RIGHT ;  Surgeon: Wynonia Sours, MD;  Location: [place];  Service: Orthopedics;  Laterality: Right;  . OOPHORECTOMY     BSO  . TENOSYNOVECTOMY Right 11/29/2013   Procedure: TENOSYNOVECTOMY FLEXOR TENDON RIGHT WRIST ;  Surgeon: Wynonia Sours, MD;  Location: [place];  Service: Orthopedics;  Laterality: Right;  . WEIL OSTEOTOMY Right 08/10/2018   Procedure: WEIL OSTEOTOMY 2, 3,4 METATARSAL RIGHT FOOT, PROXIMAL INTERPHALANGEAL RESECTION TOES 2, 3, 4;  Surgeon: Newt Minion, MD;  Location: [place];  Service: Orthopedics;  Laterality: Right;     OB History    Gravida  3   Para  3   Term  3   Preterm      AB  Living  3     SAB      TAB      Ectopic      Multiple      Live Births              Family History  Problem Relation Age of Onset  . Cancer Mother        LUNG  . Hypertension Sister   . Cancer Sister        LUNG, THYROID, UTERINE  . Cancer Brother        LUNG  . Cancer Sister        Lung cancer  . Colon cancer Neg Hx     Social History   Tobacco Use  . Smoking status: Former Smoker    Packs/day: 0.80    Years: 30.00    Pack years: 24.00    Types: Cigarettes    Quit date: 07/27/2004    Years since quitting: 15.9  . Smokeless tobacco: Never Used  Vaping Use  . Vaping Use: Never used  Substance Use Topics  .  Alcohol use: No    Alcohol/week: 0.0 standard drinks  . Drug use: No    Home Medications Prior to Admission medications   Medication Sig Start Date End Date Taking? Authorizing Provider  ALPRAZolam (XANAX) 0.25 MG tablet Take 0.25 mg by mouth at bedtime.     [provider]  Cholecalciferol (VITAMIN D3) 25 MCG (1000 UT) CAPS Take 1,000 Units by mouth daily.    [provider]  ciclopirox (LOPROX) 0.77 % cream as needed. 03/31/19   [provider]  cycloSPORINE (RESTASIS) 0.05 % ophthalmic emulsion Place 1 drop into both eyes 2 (two) times daily.     [provider]  fluticasone (KLS ALLER-FLO) 50 MCG/ACT nasal spray Place 1 spray into both nostrils daily as needed for allergies or rhinitis.    [provider]  folic acid (FOLVITE) 1 MG tablet Take 1 mg by mouth daily.    [provider]  hydroxychloroquine (PLAQUENIL) 200 MG tablet Take 400 mg by mouth daily.     [provider]  ibuprofen (ADVIL,MOTRIN) 200 MG tablet Take 200 mg by mouth every 6 (six) hours as needed for headache or moderate pain.    [provider]  losartan-hydrochlorothiazide (HYZAAR) 100-12.5 MG per tablet Take 0.5 tablets by mouth daily.     [provider]  methotrexate (RHEUMATREX) 2.5 MG tablet Take 2.5 mg by mouth See admin instructions. Take 7.5 mg by mouth daily on Friday and take 7.5 mg by mouth daily on Saturday.    [provider]  mupirocin ointment (BACTROBAN) 2 % Apply 1 application topically daily. 09/02/18   Rayburn, Neta Mends, PA-C  nitroGLYCERIN (NITROSTAT) 0.4 MG SL tablet Place 1 tablet (0.4 mg total) under the tongue every 5 (five) minutes as needed for chest pain. 12/14/16 01/05/19  Burtis Junes, NP    Allergies    Other  Review of Systems   Review of Systems  Constitutional: Negative for fever.  HENT: Negative for sore throat.   Eyes: Negative for visual disturbance.  Respiratory: Negative for cough  and shortness of breath.   Cardiovascular: Negative for chest pain.  Gastrointestinal: Positive for diarrhea, nausea and vomiting. Negative for abdominal pain and constipation.  Genitourinary: Negative for difficulty urinating and dysuria.  Musculoskeletal: Negative for back pain and neck pain.  Skin: Negative for rash.  Neurological: Negative for syncope and headaches.  Physical Exam Updated Vital Signs BP (!) 109/54   Pulse 71   Temp 99.7 F (37.6 C) (Rectal)   Resp 16   Ht 5\' 7"  (1.702 m)   Wt 70.3 kg   LMP  (LMP Unknown)   SpO2 96%   BMI 24.28 kg/m   Physical Exam Vitals and nursing note reviewed.  Constitutional:      General: She is not in acute distress.    Appearance: She is well-developed. She is ill-appearing. She is not diaphoretic.  HENT:     Head: Normocephalic and atraumatic.  Eyes:     Conjunctiva/sclera: Conjunctivae normal.  Cardiovascular:     Rate and Rhythm: Normal rate and regular rhythm.     Heart sounds: Normal heart sounds. No murmur heard.  No friction rub. No gallop.   Pulmonary:     Effort: Pulmonary effort is normal. No respiratory distress.     Breath sounds: Normal breath sounds. No wheezing or rales.  Abdominal:     General: There is no distension.     Palpations: Abdomen is soft.     Tenderness: There is no abdominal tenderness. There is no guarding.  Musculoskeletal:        General: No tenderness.     Cervical back: Normal range of motion.  Skin:    General: Skin is warm and dry.     Findings: No erythema or rash.  Neurological:     Mental Status: She is alert and oriented to person, place, and time.     ED Results / Procedures / Treatments   Labs (all labs ordered are listed, but only abnormal results are displayed) Labs Reviewed  COMPREHENSIVE METABOLIC PANEL - Abnormal; Notable for the following components:      Result Value   Sodium 129 (*)    Glucose, Bld 104 (*)    Creatinine, Ser 1.14 (*)    Calcium 8.2 (*)     Total Protein 6.2 (*)    Albumin 3.2 (*)    GFR, Estimated 50 (*)    All other components within normal limits  CBC WITH DIFFERENTIAL/PLATELET - Abnormal; Notable for the following components:   RBC 3.54 (*)    Hemoglobin 10.8 (*)    HCT 32.8 (*)    Lymphs Abs 0.1 (*)    All other components within normal limits  RESP PANEL BY RT-PCR (FLU A&B, COVID) ARPGX2  CULTURE, BLOOD (ROUTINE X 2)  CULTURE, BLOOD (ROUTINE X 2)  URINE CULTURE  LACTIC ACID, PLASMA  PROTIME-INR  APTT  LACTIC ACID, PLASMA  URINALYSIS, ROUTINE W REFLEX MICROSCOPIC  TSH  T4, FREE  CBG MONITORING, ED    EKG EKG Interpretation  Date/Time:  Wednesday June 26 2020 12:05:41 EST Ventricular Rate:  82 PR Interval:    QRS Duration: 134 QT Interval:  380 QTC Calculation: 444 R Axis:   -98 Text Interpretation: Sinus rhythm Atrial premature complex Right bundle branch block Anteroseptal infarct, age indeterminate ST elevation, consider inferior injury Lateral leads are also involved Baseline wander in lead(s) V4 No significant change since last tracing Confirmed by Gareth Morgan [phone]) on 06/26/2020 2:51:51 PM   Radiology DG Chest Port 1 View  Result Date: 06/26/2020 CLINICAL DATA:  Weakness with nausea and vomiting EXAM: PORTABLE CHEST 1 VIEW COMPARISON:  April 28, 2019; April 21, 2018; May 13, 2017 FINDINGS: There is a stable nodular opacity in the right lower lobe measuring 6 mm. There is slight scarring in the left base. The lungs elsewhere  are clear. Heart size and pulmonary vascularity are normal. No adenopathy. There is aortic atherosclerosis. There is upper thoracic dextroscoliosis. IMPRESSION: 1. 6 mm nodular opacity right lower lobe, stable from prior studies. Stability over time is indicative of benign etiology. 2.  Minimal scarring left base. 3.  No edema or airspace opacity. 4.  Stable cardiac silhouette. 5.  Aortic Atherosclerosis (ICD10-I70.0). Electronically Signed   By: Lowella Grip  III M.D.   On: 06/26/2020 12:50    Procedures Procedures (including critical care time)  Medications Ordered in ED Medications  lactated ringers infusion (has no administration in time range)  ceFEPIme (MAXIPIME) 2 g in sodium chloride 0.9 % 100 mL IVPB (has no administration in time range)  vancomycin (VANCOCIN) IVPB 1000 mg/200 mL premix (has no administration in time range)  diphenhydrAMINE (BENADRYL) injection 25 mg (has no administration in time range)  lactated ringers bolus 1,000 mL (0 mLs Intravenous Stopped 06/26/20 1302)    And  lactated ringers bolus 1,000 mL (0 mLs Intravenous Stopped 06/26/20 1541)    And  lactated ringers bolus 250 mL (250 mLs Intravenous New Bag/Given 06/26/20 1456)  ceFEPIme (MAXIPIME) 2 g in sodium chloride 0.9 % 100 mL IVPB (0 g Intravenous Stopped 06/26/20 1339)  vancomycin (VANCOCIN) IVPB 1000 mg/200 mL premix (0 mg Intravenous Stopped 06/26/20 1541)  acetaminophen (TYLENOL) tablet 1,000 mg (1,000 mg Oral Given 06/26/20 1229)    ED Course  I have reviewed the triage vital signs and the nursing notes.  Pertinent labs & imaging results that were available during my care of the patient were reviewed by me and considered in my medical decision making (see chart for details).    MDM Rules/Calculators/A&P                          79 year old female with history of hypertension, hypercholesterolemia, rheumatoid arthritis, presents with concern for generalized weakness, nausea, vomiting and diarrhea.  On arrival to the emergency department, blood pressures 95/39 and temperature of 102.5.  Given hypotension and fever, initiated fluids and empiric antibiotics for sepsis of unknown source.  She had rapid improvement of her blood pressures with fluids, differential continues to include sepsis or dehydration related to gastroenteritis.   Labs significant for hyponatremia 129, likely secondary to dehydration, mild AKI.  UA and COVID 19 testing and reevaluation  pending, may require admission for sepsis/dehydration.  TSH pending given she stopped her medications and describes itching.  Final Clinical Impression(s) / ED Diagnoses Final diagnoses:  Weakness  Dehydration  Nausea and vomiting, intractability of vomiting not specified, unspecified vomiting type    Rx / DC Orders ED Discharge Orders    None       Gareth Morgan, MD 06/26/20 1555

## 2020-06-26 NOTE — ED Notes (Signed)
Pt attempted to use bedside, unable to at this time, assisted back into bed.

## 2020-06-26 NOTE — Discharge Instructions (Signed)
Stay hydrated.  You likely have a stomach virus or inflammation in your bowels.  Please take Cipro and Flagyl as prescribed.  Take Zofran for nausea.  See your doctor for follow-up.  You may want to follow-up with your GI doctor as well.  Return to ER if you have worse abdominal pain, vomiting, fevers.

## 2020-06-26 NOTE — Progress Notes (Signed)
Pharmacy Antibiotic Note  Bethany Grant is a 79 y.o. female admitted on 06/26/2020 with generalized weakness, nausea and vomiting.  Pharmacy has been consulted for vancomycin and cefepime for sepsis.  SCr 1.14, CrCL 40 ml/min, Tmax 102.5, WBC WNL, LA 1.3.  Plan: Vanc 1gm IV Q24H for goal trough 15-20 mcg/mL Cefepime 2gm IV Q12H Monitor renal fxn, clinical progress, vanc trough as indicated  Height: 5\' 7"  (170.2 cm) Weight: 70.3 kg (155 lb) IBW/kg (Calculated) : 61.6  Temp (24hrs), Avg:100.7 F (38.2 C), Min:98.9 F (37.2 C), Max:102.5 F (39.2 C)  Recent Labs  Lab 06/26/20 1204  WBC 5.4  CREATININE 1.14*  LATICACIDVEN 1.3    Estimated Creatinine Clearance: 40.2 mL/min (A) (by C-G formula based on SCr of 1.14 mg/dL (H)).    Allergies  Allergen Reactions  . Other Nausea And Vomiting and Other (See Comments)    Doesn't do well with pain  Medications, sensitivity to tape   Vanc 12/1 >> Cefepime 12/1 >>  12/1 UCx -  12/1 BCx -   Liliana Brentlinger D. Mina Marble, PharmD, BCPS, [place] 06/26/2020, 12:59 PM

## 2020-06-26 NOTE — ED Notes (Signed)
She voided a few drops (insufficient for u/a) prior to tx to CT. Her husband remains with her.

## 2020-06-26 NOTE — ED Triage Notes (Signed)
Pt reports generalized weakness and n/v that has been going on for 4 weeks. Pt was been seen by her pcp yesterday but continues to get worse.

## 2020-06-28 LAB — URINE CULTURE: Culture: NO GROWTH

## 2020-07-01 LAB — CULTURE, BLOOD (ROUTINE X 2)
Culture: NO GROWTH
Culture: NO GROWTH
Special Requests: ADEQUATE

## 2020-07-08 DIAGNOSIS — L299 Pruritus, unspecified: Secondary | ICD-10-CM | POA: Insufficient documentation

## 2020-07-11 ENCOUNTER — Encounter: Payer: Self-pay | Admitting: Cardiology

## 2020-07-11 ENCOUNTER — Ambulatory Visit: Payer: Medicare HMO | Admitting: Cardiology

## 2020-07-11 ENCOUNTER — Other Ambulatory Visit: Payer: Self-pay

## 2020-07-11 VITALS — BP 118/62 | HR 56 | Ht 67.0 in | Wt 153.0 lb

## 2020-07-11 DIAGNOSIS — M069 Rheumatoid arthritis, unspecified: Secondary | ICD-10-CM

## 2020-07-11 DIAGNOSIS — I209 Angina pectoris, unspecified: Secondary | ICD-10-CM

## 2020-07-11 DIAGNOSIS — I444 Left anterior fascicular block: Secondary | ICD-10-CM

## 2020-07-11 DIAGNOSIS — Z01812 Encounter for preprocedural laboratory examination: Secondary | ICD-10-CM

## 2020-07-11 DIAGNOSIS — R072 Precordial pain: Secondary | ICD-10-CM

## 2020-07-11 DIAGNOSIS — R0602 Shortness of breath: Secondary | ICD-10-CM

## 2020-07-11 MED ORDER — METOPROLOL TARTRATE 50 MG PO TABS: 50.0000 mg | ORAL_TABLET | Freq: Once | ORAL | 0 refills | Status: DC

## 2020-07-11 NOTE — Patient Instructions (Signed)
Medication Instructions:  The current medical regimen is effective;  continue present plan and medications.  *If you need a refill on your cardiac medications before your next appointment, please call your pharmacy*  Lab Work: You will need lab work before your [place].  This will be scheduled once your CT has been scheduled. (BMP)  If you have labs (blood work) drawn today and your tests are completely normal, you will receive your results only by: Marland Kitchen MyChart Message (if you have MyChart) OR . A paper copy in the mail If you have any lab test that is abnormal or we need to change your treatment, we will call you to review the results.  Testing/Procedures: Your physician has requested that you have an echocardiogram. Echocardiography is a painless test that uses sound waves to create images of your heart. It provides your doctor with information about the size and shape of your heart and how well your heart's chambers and valves are working. This procedure takes approximately one hour. There are no restrictions for this procedure.  Your cardiac CT will be scheduled at:   [facility] [address] [place], [place] [address] [phone]  Please [facility] at the [facility] main [facility] of [facility] 30 minutes prior to test start time. Proceed to the [facility] Radiology Department (first floor) to check-in and test prep.  Please follow these instructions carefully (unless otherwise directed):  On the Night Before the Test: . Be sure to Drink plenty of water. . Do not consume any caffeinated/decaffeinated beverages or chocolate 12 hours prior to your test. . Do not take any antihistamines 12 hours prior to your test.  On the Day of the Test: . Drink plenty of water. Do not drink any water within one hour of the test. . Do not eat any food 4 hours prior to the test. . You may take your regular medications prior to the test.  . Take metoprolol (Lopressor) two hours  prior to test. . HOLD Furosemide/Hydrochlorothiazide morning of the test. . FEMALES- please wear underwire-free bra if available    After the Test: . Drink plenty of water. . After receiving IV contrast, you may experience a mild flushed feeling. This is normal. . On occasion, you may experience a mild rash up to 24 hours after the test. This is not dangerous. If this occurs, you can take Benadryl 25 mg and increase your fluid intake. . If you experience trouble breathing, this can be serious. If it is severe call 911 IMMEDIATELY. If it is mild, please call our office.  Once we have confirmed authorization from your insurance company, we will call you to set up a date and time for your test. Based on how quickly your insurance processes prior authorizations requests, please allow up to 4 weeks to be contacted for scheduling your Cardiac CT appointment. Be advised that routine Cardiac CT appointments could be scheduled as many as 8 weeks after your provider has ordered it.  For non-scheduling related questions, please contact the cardiac imaging nurse navigator should you have any questions/concerns: Marchia Bond, Cardiac Imaging Nurse Navigator Burley Saver, Interim Cardiac Imaging Nurse [place] and Vascular Services Direct Office Dial: [phone]   For scheduling needs, including cancellations and rescheduling, please call [place], [phone].  Follow-Up: At [facility], you and your health needs are our priority.  As part of our continuing mission to provide you with exceptional heart care, we have created designated Provider Care  Teams.  These Care Teams include your primary Cardiologist (physician) and Advanced Practice Providers (APPs -  Physician Assistants and Nurse Practitioners) who all work together to provide you with the care you need, when you need it.  We recommend signing up for the patient portal called "MyChart".  Sign up information is provided on this  After Visit Summary.  MyChart is used to connect with patients for Virtual Visits (Telemedicine).  Patients are able to view lab/test results, encounter notes, upcoming appointments, etc.  Non-urgent messages can be sent to your provider as well.   To learn more about what you can do with MyChart, go to [email or web address].    Your next appointment:   4 week(s)  The format for your next appointment:   In Person  Provider:   Candee Furbish, MD  Thank you for choosing [facility]!!

## 2020-07-11 NOTE — Progress Notes (Signed)
Cardiology Office Note:    Date:  07/11/2020   ID:  Bethany Grant, DOB [birth year], MRN [id number]  PCP:  Kirk Ruths, MD  [facility] Cardiologist:  Candee Furbish, MD  [place] Electrophysiologist:  None   Referring MD: No ref. provider found     History of Present Illness:    Bethany Grant is a 79 y.o. female here for the follow-up of right bundle branch block, bifascicular block, left anterior fascicular block.  Hypertension hyperlipidemia rheumatoid arthritis interstitial lung disease.  Prior atypical chest pain nuclear stress reassuring.  No ischemia.  Blood pressure med previously has been cut in half.  She has been experiencing more shortness of breath and chest tightness.  This is be will during to her.  Her husband here with her would like to know what is going on.  He states that he has had previous periods with normal EKGs etc. and ended up having a 99% LAD lesion.  He worries that his wife may have similar findings.  She has had generalized malaise for quite some time.  She has moved her care over to [place] [facility].    Past Medical History:  Diagnosis Date  . Hypercholesterolemia   . Hypertension   . Osteopenia 09/2017   T score -2.1 FRAX 18% / 2.7%  . PONV (postoperative nausea and vomiting)   . Rheumatoid arthritis(714.0)     Past Surgical History:  Procedure Laterality Date  . ABDOMINAL HYSTERECTOMY  1991   TAH/BSO  . BARTHOLIN GLAND CYST EXCISION  1980   RIGHT  . BREAST SURGERY  1988   BREAST CYST  . COLONOSCOPY    . COLONOSCOPY N/A 08/24/2013   Procedure: COLONOSCOPY;  Surgeon: Rogene Houston, MD;  [facility];  Service: Endoscopy;  Laterality: N/A;  100  . FOOT SURGERY     2 TIMES  . FOOT SURGERY Left   . HAND SURGERY    . HEMORRHOID SURGERY     been over 20 years ago  . OLECRANON BURSECTOMY Right 11/29/2013   Procedure: EXCISION OLECRANON BURSA RIGHT ;  Surgeon: Wynonia Sours, MD;  Location: [place];  Service: Orthopedics;  Laterality: Right;  . OOPHORECTOMY     BSO  . TENOSYNOVECTOMY Right 11/29/2013   Procedure: TENOSYNOVECTOMY FLEXOR TENDON RIGHT WRIST ;  Surgeon: Wynonia Sours, MD;  Location: [place];  Service: Orthopedics;  Laterality: Right;  . WEIL OSTEOTOMY Right 08/10/2018   Procedure: WEIL OSTEOTOMY 2, 3,4 METATARSAL RIGHT FOOT, PROXIMAL INTERPHALANGEAL RESECTION TOES 2, 3, 4;  Surgeon: Newt Minion, MD;  Location: [place];  Service: Orthopedics;  Laterality: Right;    Current Medications: Current Meds  Medication Sig  . ALPRAZolam (XANAX) 0.25 MG tablet Take 0.25 mg by mouth at bedtime.  . Cholecalciferol (VITAMIN D3) 25 MCG (1000 UT) CAPS Take 1,000 Units by mouth daily.  . ciclopirox (LOPROX) 0.77 % cream as needed.  . ciprofloxacin (CIPRO) 500 MG tablet Take 1 tablet (500 mg total) by mouth 2 (two) times daily. One po bid x 7 days  . cycloSPORINE (RESTASIS) 0.05 % ophthalmic emulsion Place 1 drop into both eyes 2 (two) times daily.  . famotidine (PEPCID) 20 MG tablet Take 20 mg by mouth 2 (two) times daily as needed.  . fluticasone (FLONASE) 50 MCG/ACT nasal spray Place 1 spray into both nostrils daily as needed for allergies or rhinitis.  . folic acid (FOLVITE) 1 MG tablet Take  1 mg by mouth daily.  . hydroxychloroquine (PLAQUENIL) 200 MG tablet Take 400 mg by mouth daily.  . hydrOXYzine (ATARAX/VISTARIL) 25 MG tablet Take 25-50 mg by mouth at bedtime as needed.  Marland Kitchen ibuprofen (ADVIL,MOTRIN) 200 MG tablet Take 200 mg by mouth every 6 (six) hours as needed for headache or moderate pain.  Marland Kitchen losartan-hydrochlorothiazide (HYZAAR) 100-12.5 MG per tablet Take 0.5 tablets by mouth daily.   . methotrexate (RHEUMATREX) 2.5 MG tablet Take 2.5 mg by mouth See admin instructions. Take 7.5 mg by mouth daily on Friday and take 7.5 mg by mouth daily on Saturday.  . metroNIDAZOLE (FLAGYL) 500 MG tablet Take 1 tablet (500 mg total) by mouth 2 (two) times daily. One po  bid x 7 days  . mupirocin ointment (BACTROBAN) 2 % Apply 1 application topically daily.  . nitroGLYCERIN (NITROSTAT) 0.4 MG SL tablet Place 1 tablet (0.4 mg total) under the tongue every 5 (five) minutes as needed for chest pain.  Marland Kitchen ondansetron (ZOFRAN ODT) 4 MG disintegrating tablet 69m ODT q4 hours prn nausea/vomit  . pantoprazole (PROTONIX) 40 MG tablet Take 40 mg by mouth daily.     Allergies:   Other   Social History   Socioeconomic History  . Marital status: Married    Spouse name: Not on file  . Number of children: Not on file  . Years of education: Not on file  . Highest education level: Not on file  Occupational History  . Not on file  Tobacco Use  . Smoking status: Former Smoker    Packs/day: 0.80    Years: 30.00    Pack years: 24.00    Types: Cigarettes    Quit date: 07/27/2004    Years since quitting: 15.9  . Smokeless tobacco: Never Used  Vaping Use  . Vaping Use: Never used  Substance and Sexual Activity  . Alcohol use: No    Alcohol/week: 0.0 standard drinks  . Drug use: No  . Sexual activity: Not Currently    Birth control/protection: Surgical    Comment: 1st intercourse 79 yo-Fewer than 5 partners  Other Topics Concern  . Not on file  Social History Narrative  . Not on file   Social Determinants of Health   Financial Resource Strain: Not on file  Food Insecurity: Not on file  Transportation Needs: Not on file  Physical Activity: Not on file  Stress: Not on file  Social Connections: Not on file     Family History: The patient's family history includes Cancer in her brother, mother, sister, and sister; Hypertension in her sister. There is no history of Colon cancer.  ROS:   Please see the history of present illness.     All other systems reviewed and are negative.  EKGs/Labs/Other Studies Reviewed:     EKG:  EKG is  ordered today.  The ekg ordered today demonstrates sinus rhythm  Recent Labs: 06/26/2020: ALT 17; BUN 17; Creatinine, Ser  1.14; Hemoglobin 10.8; Platelets 167; Potassium 4.1; Sodium 129; TSH 2.128  Recent Lipid Panel No results found for: CHOL, TRIG, HDL, CHOLHDL, VLDL, LDLCALC, LDLDIRECT    Physical Exam:    VS:  BP 118/62 (BP Location: Left Arm, Patient Position: Sitting, Cuff Size: Normal)   Pulse (!) 56   Ht _0  (1.702 m)   Wt 153 lb (69.4 kg)   LMP  (LMP Unknown)   SpO2 98%   BMI 23.96 kg/m     Wt Readings from Last 3 Encounters:  07/11/20 153 lb (69.4 kg)  06/26/20 155 lb (70.3 kg)  02/05/20 161 lb 9.6 oz (73.3 kg)     GEN:  Well nourished, well developed in no acute distress HEENT: Normal NECK: No JVD; No carotid bruits LYMPHATICS: No lymphadenopathy CARDIAC: RRR, no murmurs, rubs, gallops RESPIRATORY:  Clear to auscultation without rales, wheezing or rhonchi  ABDOMEN: Soft, non-tender, non-distended MUSCULOSKELETAL:  No edema; No deformity  SKIN: Warm and dry NEUROLOGIC:  Alert and oriented x 3 PSYCHIATRIC:  Normal affect   ASSESSMENT:    1. Angina pectoris ([place])   2. Precordial pain   3. Rheumatoid arthritis, involving unspecified site, unspecified whether rheumatoid factor present ([place])   4. LAFB (left anterior fascicular block)   5. Shortness of breath   6. Pre-procedure lab exam    PLAN:    In order of problems listed above:  SOB/chest tightness -We will go ahead and order an echocardiogram to ensure proper structure and function. -We will check a coronary CT scan with possible FFR analysis as well.  There is a possibility based upon the CT that she may require diagnostic angiogram.  We have discussed this possibility and risks and benefits involved including stroke heart attack death renal impairment bleeding.  She is willing to proceed if necessary.  Given her heart rate of 82 on prior EKG, we will give her 50 mg of metoprolol tartrate prior to testing.  Essential hypertension -Blood pressure medication has been cut in half.  Agree with Dr. Ouida Sills.  No changes  made.  Bifascicular block -Conduction disease noted.  No syncope.  Pacemaker may be warranted in the future if electrical system deteriorates.  Fatigue, multifactorial.  Rheumatoid arthritis -On methotrexate Plaquenil.  Exercise.   Shared Decision Making/Informed Consent  The risks [stroke (1 in 1000), death (1 in 1000), kidney failure [usually temporary] (1 in 500), bleeding (1 in 200), allergic reaction [possibly serious] (1 in 200)], benefits (diagnostic support and management of coronary artery disease) and alternatives of a cardiac catheterization were discussed in detail with Ms. Rau and she is willing to proceed.        Medication Adjustments/Labs and Tests Ordered: Current medicines are reviewed at length with the patient today.  Concerns regarding medicines are outlined above.  Orders Placed This Encounter  Procedures  . CT CORONARY MORPH W/CTA COR W/SCORE W/CA W/CM &/OR WO/CM  . CT CORONARY FRACTIONAL FLOW RESERVE DATA PREP  . CT CORONARY FRACTIONAL FLOW RESERVE FLUID ANALYSIS  . Basic metabolic panel  . ECHOCARDIOGRAM COMPLETE   Meds ordered this encounter  Medications  . metoprolol tartrate (LOPRESSOR) 50 MG tablet    Sig: Take 1 tablet (50 mg total) by mouth once for 1 dose. Take 1 tablet 2 hours before your CT scan    Dispense:  1 tablet    Refill:  0    Patient Instructions  Medication Instructions:  The current medical regimen is effective;  continue present plan and medications.  *If you need a refill on your cardiac medications before your next appointment, please call your pharmacy*  Lab Work: You will need lab work before your [place].  This will be scheduled once your CT has been scheduled. (BMP)  If you have labs (blood work) drawn today and your tests are completely normal, you will receive your results only by: Marland Kitchen MyChart Message (if you have MyChart) OR . A paper copy in the mail If you have any lab test that is abnormal or we need to  change  your treatment, we will call you to review the results.  Testing/Procedures: Your physician has requested that you have an echocardiogram. Echocardiography is a painless test that uses sound waves to create images of your heart. It provides your doctor with information about the size and shape of your heart and how well your heart's chambers and valves are working. This procedure takes approximately one hour. There are no restrictions for this procedure.  Your cardiac CT will be scheduled at:   [facility] [address] [place], [place] [address] [phone]  Please arrive at the [facility] main entrance of [facility] 30 minutes prior to test start time. Proceed to the [facility] Radiology Department (first floor) to check-in and test prep.  Please follow these instructions carefully (unless otherwise directed):  On the Night Before the Test: . Be sure to Drink plenty of water. . Do not consume any caffeinated/decaffeinated beverages or chocolate 12 hours prior to your test. . Do not take any antihistamines 12 hours prior to your test.  On the Day of the Test: . Drink plenty of water. Do not drink any water within one hour of the test. . Do not eat any food 4 hours prior to the test. . You may take your regular medications prior to the test.  . Take metoprolol (Lopressor) two hours prior to test. . HOLD Furosemide/Hydrochlorothiazide morning of the test. . FEMALES- please wear underwire-free bra if available    After the Test: . Drink plenty of water. . After receiving IV contrast, you may experience a mild flushed feeling. This is normal. . On occasion, you may experience a mild rash up to 24 hours after the test. This is not dangerous. If this occurs, you can take Benadryl 25 mg and increase your fluid intake. . If you experience trouble breathing, this can be serious. If it is severe call 911 IMMEDIATELY. If it is mild, please call our office.  Once we  have confirmed authorization from your insurance company, we will call you to set up a date and time for your test. Based on how quickly your insurance processes prior authorizations requests, please allow up to 4 weeks to be contacted for scheduling your Cardiac CT appointment. Be advised that routine Cardiac CT appointments could be scheduled as many as 8 weeks after your provider has ordered it.  For non-scheduling related questions, please contact the cardiac imaging nurse navigator should you have any questions/concerns: Marchia Bond, Cardiac Imaging Nurse Navigator Burley Saver, Interim Cardiac Imaging Nurse [place] and Vascular Services Direct Office Dial: [phone]   For scheduling needs, including cancellations and rescheduling, please call [place], [phone].  Follow-Up: At [facility], you and your health needs are our priority.  As part of our continuing mission to provide you with exceptional heart care, we have created designated Provider Care Teams.  These Care Teams include your primary Cardiologist (physician) and Advanced Practice Providers (APPs -  Physician Assistants and Nurse Practitioners) who all work together to provide you with the care you need, when you need it.  We recommend signing up for the patient portal called "MyChart".  Sign up information is provided on this After Visit Summary.  MyChart is used to connect with patients for Virtual Visits (Telemedicine).  Patients are able to view lab/test results, encounter notes, upcoming appointments, etc.  Non-urgent messages can be sent to your provider as well.   To learn more about what you can  do with MyChart, go to [email or web address].    Your next appointment:   4 week(s)  The format for your next appointment:   In Person  Provider:   Candee Furbish, MD  Thank you for choosing [facility]!!        Signed, Candee Furbish, MD  07/11/2020 2:38 PM    [place]

## 2020-07-27 HISTORY — PX: CT CORONARY CA SCORING: HXRAD806

## 2020-08-01 ENCOUNTER — Telehealth (HOSPITAL_COMMUNITY): Payer: Self-pay | Admitting: *Deleted

## 2020-08-01 ENCOUNTER — Other Ambulatory Visit: Payer: Medicare HMO

## 2020-08-01 NOTE — Telephone Encounter (Signed)
Reaching out to patient to offer assistance regarding upcoming cardiac imaging study; pt verbalizes understanding of appt date/time, parking situation and where to check in, pre-test NPO status and medications ordered, and verified current allergies; name and call back number provided for further questions should they arise  Larey Brick RN Navigator Cardiac Imaging [facility] [phone] office [phone] cell  Pt state she is scheduled for blood work on 08/02/20.

## 2020-08-02 ENCOUNTER — Other Ambulatory Visit: Payer: Self-pay

## 2020-08-02 ENCOUNTER — Other Ambulatory Visit: Payer: Medicare HMO | Admitting: *Deleted

## 2020-08-02 ENCOUNTER — Other Ambulatory Visit: Payer: Medicare HMO

## 2020-08-02 DIAGNOSIS — Z01812 Encounter for preprocedural laboratory examination: Secondary | ICD-10-CM

## 2020-08-02 DIAGNOSIS — I209 Angina pectoris, unspecified: Secondary | ICD-10-CM

## 2020-08-02 LAB — BASIC METABOLIC PANEL
BUN/Creatinine Ratio: 16 (ref 12–28)
BUN: 15 mg/dL (ref 8–27)
CO2: 22 mmol/L (ref 20–29)
Calcium: 8.6 mg/dL — ABNORMAL LOW (ref 8.7–10.3)
Chloride: 102 mmol/L (ref 96–106)
Creatinine, Ser: 0.95 mg/dL (ref 0.57–1.00)
GFR calc Af Amer: 67 mL/min/{1.73_m2} (ref >59)
GFR calc non Af Amer: 58 mL/min/{1.73_m2} — ABNORMAL LOW (ref >59)
Glucose: 87 mg/dL (ref 65–99)
Potassium: 4.4 mmol/L (ref 3.5–5.2)
Sodium: 140 mmol/L (ref 134–144)

## 2020-08-05 ENCOUNTER — Encounter: Payer: Self-pay | Admitting: *Deleted

## 2020-08-05 ENCOUNTER — Encounter (HOSPITAL_COMMUNITY): Payer: Self-pay

## 2020-08-05 ENCOUNTER — Other Ambulatory Visit: Payer: Self-pay

## 2020-08-05 ENCOUNTER — Ambulatory Visit (HOSPITAL_COMMUNITY)
Admission: RE | Admit: 2020-08-05 | Discharge: 2020-08-05 | Disposition: A | Discharge status: Home / Self Care | Payer: Medicare HMO | Source: Ambulatory Visit | Attending: Cardiology | Admitting: Cardiology

## 2020-08-05 ENCOUNTER — Telehealth: Payer: Self-pay | Admitting: Cardiology

## 2020-08-05 DIAGNOSIS — R0602 Shortness of breath: Secondary | ICD-10-CM | POA: Diagnosis present

## 2020-08-05 DIAGNOSIS — R072 Precordial pain: Secondary | ICD-10-CM | POA: Insufficient documentation

## 2020-08-05 DIAGNOSIS — Z006 Encounter for examination for normal comparison and control in clinical research program: Secondary | ICD-10-CM

## 2020-08-05 MED ORDER — NITROGLYCERIN 0.4 MG SL SUBL
SUBLINGUAL_TABLET | SUBLINGUAL | Status: AC
  Filled 2020-08-05: qty 2

## 2020-08-05 MED ORDER — NITROGLYCERIN 0.4 MG SL SUBL
0.8000 mg | SUBLINGUAL_TABLET | Freq: Once | SUBLINGUAL | Status: AC
  Administered 2020-08-05: 0.8 mg via SUBLINGUAL

## 2020-08-05 MED ORDER — IOHEXOL 350 MG/ML SOLN
80.0000 mL | Freq: Once | INTRAVENOUS | Status: AC | PRN
  Administered 2020-08-05: 80 mL via INTRAVENOUS

## 2020-08-05 NOTE — Telephone Encounter (Signed)
Tracy with [facility] is calling to report abnormal CT results. 

## 2020-08-05 NOTE — Telephone Encounter (Signed)
Call received from radiology with initial cardiac CT report:  IMPRESSION: 1. Relatively diffuse peripheral predominant ground-glass. Appearance is nonspecific. Considerations include mild or resolving COVID-19 pneumonia, amongst other infectious and inflammatory Etiologies.  Will forward to ordering doctor.

## 2020-08-05 NOTE — Progress Notes (Signed)
Patient tolerated CT well. Drank water and ate cookies after. Vital signs stable encourage to drink water throughout day.Reasons explained and verbalized understanding. Ambulated steady gait.

## 2020-08-05 NOTE — Research (Signed)
IDENTIFY Informed Consent                  Subject Name:   Bethany Grant. Bethany Grant   Subject met inclusion and exclusion criteria.  The informed consent form, study requirements and expectations were reviewed with the subject and questions and concerns were addressed prior to the signing of the consent form.  The subject verbalized understanding of the trial requirements.  The subject agreed to participate in the IDENTIFY trial and signed the informed consent.  The informed consent was obtained prior to performance of any protocol-specific procedures for the subject.  A copy of the signed informed consent was given to the subject and a copy was placed in the subject's medical record.   [place] Erian Lariviere, Research Assistant  08/05/2020  08:47 a.m.

## 2020-08-06 ENCOUNTER — Ambulatory Visit (HOSPITAL_COMMUNITY): Payer: Medicare HMO | Attending: Cardiovascular Disease

## 2020-08-06 DIAGNOSIS — R0602 Shortness of breath: Secondary | ICD-10-CM | POA: Diagnosis present

## 2020-08-06 LAB — ECHOCARDIOGRAM COMPLETE
Area-P 1/2: 2.42 cm2
S' Lateral: 3.4 cm

## 2020-08-07 ENCOUNTER — Ambulatory Visit: Payer: Medicare HMO | Admitting: Pulmonary Disease

## 2020-08-07 NOTE — Telephone Encounter (Signed)
Dr Marlou Porch has reviewed CT result.  Will review with pt in full detail at her upcoming scheduled appt with Dr Marlou Porch 08/08/2020 at 9:20 am.

## 2020-08-08 ENCOUNTER — Ambulatory Visit: Payer: Medicare HMO | Admitting: Cardiology

## 2020-08-08 ENCOUNTER — Encounter: Payer: Self-pay | Admitting: Cardiology

## 2020-08-08 ENCOUNTER — Other Ambulatory Visit: Payer: Self-pay

## 2020-08-08 VITALS — BP 120/60 | HR 62 | Ht 67.0 in | Wt 156.0 lb

## 2020-08-08 DIAGNOSIS — F5101 Primary insomnia: Secondary | ICD-10-CM | POA: Insufficient documentation

## 2020-08-08 DIAGNOSIS — I2510 Atherosclerotic heart disease of native coronary artery without angina pectoris: Secondary | ICD-10-CM | POA: Insufficient documentation

## 2020-08-08 DIAGNOSIS — I700 Atherosclerosis of aorta: Secondary | ICD-10-CM | POA: Insufficient documentation

## 2020-08-08 DIAGNOSIS — I2584 Coronary atherosclerosis due to calcified coronary lesion: Secondary | ICD-10-CM | POA: Diagnosis not present

## 2020-08-08 MED ORDER — ROSUVASTATIN CALCIUM 10 MG PO TABS: 10.0000 mg | ORAL_TABLET | Freq: Every day | ORAL | 3 refills | Status: DC

## 2020-08-08 MED ORDER — ASPIRIN EC 81 MG PO TBEC: 81.0000 mg | DELAYED_RELEASE_TABLET | Freq: Every day | ORAL | 3 refills | Status: AC

## 2020-08-08 NOTE — Progress Notes (Signed)
Cardiology Office Note:    Date:  08/08/2020   ID:  Bethany Grant, DOB July 18, 1941, MRN [id number]  PCP:  Kirk Ruths, MD  [facility] Cardiologist:  Candee Furbish, MD  [facility] Electrophysiologist:  None   Referring MD: Kirk Ruths, MD    History of Present Illness:    Bethany Grant is a 80 y.o. female here for follow-up of coronary CT scan which showed mild LAD calcified plaque 0 to 24% stenosis nonflow limiting.  Aortic atherosclerosis was noted as well.  Previous symptoms of shortness of breath, mild chest tightness.  Her husband is a patient of Dr. Theodosia Blender and had a 99% proximal LAD stented.  CT  1. Coronary calcium score of 12.3. This was 50 percentile for age and sex matched control.  2. Normal coronary origin with right dominance.  3.  Small focal calcified proximal LAD plaque, 0-24% stenosis.  4.  Aortic atherosclerosis.  5. CAD-RADS 1. Minimal non-obstructive CAD (0-24%). Consider non-atherosclerotic causes of chest pain. Consider preventive therapy and risk factor modification.  Candee Furbish, MD [facility]   Cardiogram 2021 1. Left ventricular ejection fraction, by estimation, is 60 to 65%. Left  ventricular ejection fraction by 3D volume is 63 %. The left ventricle has  normal function. The left ventricle has no regional wall motion  abnormalities. Left ventricular diastolic  parameters were normal. The average left ventricular global longitudinal  strain is -23.5 %. The global longitudinal strain is normal.  2. Right ventricular systolic function is normal. The right ventricular  size is normal. There is normal pulmonary artery systolic pressure. The  estimated right ventricular systolic pressure is 95.1 mmHg.  3. The mitral valve is grossly normal. Trivial mitral valve  regurgitation. No evidence of mitral stenosis.  4. The aortic valve is tricuspid. There is mild calcification of the  aortic valve. Aortic valve  regurgitation is not visualized. No aortic  stenosis is present.  5. The inferior vena cava is normal in size with greater than 50%  respiratory variability, suggesting right atrial pressure of 3 mmHg.   Conclusion(s)/Recommendation(s): Normal biventricular function without  evidence of hemodynamically significant valvular heart disease.    Past Medical History:  Diagnosis Date  . Hypercholesterolemia   . Hypertension   . Osteopenia 09/2017   T score -2.1 FRAX 18% / 2.7%  . PONV (postoperative nausea and vomiting)   . Rheumatoid arthritis(714.0)     Past Surgical History:  Procedure Laterality Date  . ABDOMINAL HYSTERECTOMY  1991   TAH/BSO  . BARTHOLIN GLAND CYST EXCISION  1980   RIGHT  . BREAST SURGERY  1988   BREAST CYST  . COLONOSCOPY    . COLONOSCOPY N/A 08/24/2013   Procedure: COLONOSCOPY;  Surgeon: Rogene Houston, MD;  [facility];  Service: Endoscopy;  Laterality: N/A;  100  . FOOT SURGERY     2 TIMES  . FOOT SURGERY Left   . HAND SURGERY    . HEMORRHOID SURGERY     been over 20 years ago  . OLECRANON BURSECTOMY Right 11/29/2013   Procedure: EXCISION OLECRANON BURSA RIGHT ;  Surgeon: Wynonia Sours, MD;  Location: [place];  Service: Orthopedics;  Laterality: Right;  . OOPHORECTOMY     BSO  . TENOSYNOVECTOMY Right 11/29/2013   Procedure: TENOSYNOVECTOMY FLEXOR TENDON RIGHT WRIST ;  Surgeon: Wynonia Sours, MD;  Location: [place];  Service: Orthopedics;  Laterality: Right;  . WEIL OSTEOTOMY  Right 08/10/2018   Procedure: WEIL OSTEOTOMY 2, 3,4 METATARSAL RIGHT FOOT, PROXIMAL INTERPHALANGEAL RESECTION TOES 2, 3, 4;  Surgeon: Newt Minion, MD;  Location: [place];  Service: Orthopedics;  Laterality: Right;    Current Medications: Current Meds  Medication Sig  . ALPRAZolam (XANAX) 0.25 MG tablet Take 0.25 mg by mouth at bedtime.  Marland Kitchen aspirin EC 81 MG tablet Take 1 tablet (81 mg total) by mouth daily. Swallow whole.  .  Cholecalciferol (VITAMIN D3) 25 MCG (1000 UT) CAPS Take 1,000 Units by mouth daily.  . ciclopirox (LOPROX) 0.77 % cream as needed.  . ciprofloxacin (CIPRO) 500 MG tablet Take 1 tablet (500 mg total) by mouth 2 (two) times daily. One po bid x 7 days  . cycloSPORINE (RESTASIS) 0.05 % ophthalmic emulsion Place 1 drop into both eyes 2 (two) times daily.  . famotidine (PEPCID) 20 MG tablet Take 20 mg by mouth 2 (two) times daily as needed.  . fluticasone (FLONASE) 50 MCG/ACT nasal spray Place 1 spray into both nostrils daily as needed for allergies or rhinitis.  . folic acid (FOLVITE) 1 MG tablet Take 1 mg by mouth daily.  . hydroxychloroquine (PLAQUENIL) 200 MG tablet Take 400 mg by mouth daily.  . hydrOXYzine (ATARAX/VISTARIL) 25 MG tablet Take 25-50 mg by mouth at bedtime as needed.  Marland Kitchen ibuprofen (ADVIL,MOTRIN) 200 MG tablet Take 200 mg by mouth every 6 (six) hours as needed for headache or moderate pain.  Marland Kitchen losartan (COZAAR) 50 MG tablet Take 50 mg by mouth daily.  . methotrexate (RHEUMATREX) 2.5 MG tablet Take 2.5 mg by mouth See admin instructions. Take 7.5 mg by mouth daily on Friday and take 7.5 mg by mouth daily on Saturday.  . metroNIDAZOLE (FLAGYL) 500 MG tablet Take 1 tablet (500 mg total) by mouth 2 (two) times daily. One po bid x 7 days  . mupirocin ointment (BACTROBAN) 2 % Apply 1 application topically daily.  . nitroGLYCERIN (NITROSTAT) 0.4 MG SL tablet Place 1 tablet (0.4 mg total) under the tongue every 5 (five) minutes as needed for chest pain.  Marland Kitchen ondansetron (ZOFRAN ODT) 4 MG disintegrating tablet 4mg  ODT q4 hours prn nausea/vomit  . pantoprazole (PROTONIX) 40 MG tablet Take 40 mg by mouth daily.  . rosuvastatin (CRESTOR) 10 MG tablet Take 1 tablet (10 mg total) by mouth daily.     Allergies:   Other   Social History   Socioeconomic History  . Marital status: Married    Spouse name: Not on file  . Number of children: Not on file  . Years of education: Not on file  . Highest  education level: Not on file  Occupational History  . Not on file  Tobacco Use  . Smoking status: Former Smoker    Packs/day: 0.80    Years: 30.00    Pack years: 24.00    Types: Cigarettes    Quit date: 07/27/2004    Years since quitting: 16.0  . Smokeless tobacco: Never Used  Vaping Use  . Vaping Use: Never used  Substance and Sexual Activity  . Alcohol use: No    Alcohol/week: 0.0 standard drinks  . Drug use: No  . Sexual activity: Not Currently    Birth control/protection: Surgical    Comment: 1st intercourse 80 yo-Fewer than 5 partners  Other Topics Concern  . Not on file  Social History Narrative  . Not on file   Social Determinants of Health   Financial Resource Strain: Not on file  Food Insecurity: Not on file  Transportation Needs: Not on file  Physical Activity: Not on file  Stress: Not on file  Social Connections: Not on file     Family History: The patient's family history includes Cancer in her brother, mother, sister, and sister; Hypertension in her sister. There is no history of Colon cancer.  ROS:   Please see the history of present illness.     All other systems reviewed and are negative.  EKGs/Labs/Other Studies Reviewed:    The following studies were reviewed today: above    Recent Labs: 06/26/2020: ALT 17; Hemoglobin 10.8; Platelets 167; TSH 2.128 08/02/2020: BUN 15; Creatinine, Ser 0.95; Potassium 4.4; Sodium 140  Recent Lipid Panel No results found for: CHOL, TRIG, HDL, CHOLHDL, VLDL, LDLCALC, LDLDIRECT    Physical Exam:    VS:  BP 120/60 (BP Location: Left Arm, Patient Position: Sitting, Cuff Size: Normal)   Pulse 62   Ht 5\' 7"  (1.702 m)   Wt 156 lb (70.8 kg)   LMP  (LMP Unknown)   SpO2 96%   BMI 24.43 kg/m     Wt Readings from Last 3 Encounters:  08/08/20 156 lb (70.8 kg)  07/11/20 153 lb (69.4 kg)  06/26/20 155 lb (70.3 kg)     GEN:  Well nourished, well developed in no acute distress HEENT: Normal NECK: No JVD; No  carotid bruits LYMPHATICS: No lymphadenopathy CARDIAC: RRR, no murmurs, rubs, gallops RESPIRATORY:  Clear to auscultation without rales, wheezing or rhonchi  ABDOMEN: Soft, non-tender, non-distended MUSCULOSKELETAL:  No edema; No deformity  SKIN: Warm and dry NEUROLOGIC:  Alert and oriented x 3 PSYCHIATRIC:  Normal affect   ASSESSMENT:    1. Coronary artery calcification    PLAN:    In order of problems listed above:  Coronary artery disease/calcified coronary artery plaque proximal LAD 0 to 24% nonflow limiting - Start aspirin 81 mg - Start Crestor 10 mg once a day with plans to increase to 20 mg goal dose. - In 3 months check lipid panel with ALT. - See her in the office after that.  Make sure she is doing well on this medication.  Her husband also takes Crestor with his LAD stenosis. -Answered all questions, lengthy conversation with the she and her husband  Bifascicular block - No syncope.  Continue to monitor.  Rheumatoid arthritis - On methotrexate hydroxychloroquine.  Liver function is routinely monitored.  Essential hypertension - Previously blood pressure medication was cut in half.  Today excellent 120/60.     Medication Adjustments/Labs and Tests Ordered: Current medicines are reviewed at length with the patient today.  Concerns regarding medicines are outlined above.  Orders Placed This Encounter  Procedures  . Lipid panel  . ALT   Meds ordered this encounter  Medications  . rosuvastatin (CRESTOR) 10 MG tablet    Sig: Take 1 tablet (10 mg total) by mouth daily.    Dispense:  90 tablet    Refill:  3  . aspirin EC 81 MG tablet    Sig: Take 1 tablet (81 mg total) by mouth daily. Swallow whole.    Dispense:  90 tablet    Refill:  3    Patient Instructions  Medication Instructions:  1) START CRESTOR (rosuvastatin) 10 mg daily 2) START ASPIRIN 81 mg daily *If you need a refill on your cardiac medications before your next appointment, please call your  pharmacy*  Lab Work: Your provider recommends that you return for FASTING labs In  3 months. If you have labs (blood work) drawn today and your tests are completely normal, you will receive your results only by: Marland Kitchen MyChart Message (if you have MyChart) OR . A paper copy in the mail If you have any lab test that is abnormal or we need to change your treatment, we will call you to review the results.  Follow-Up: Your provider recommends that you schedule a follow-up appointment in 3 months to review your blood work.    Signed, Candee Furbish, MD  08/08/2020 10:38 AM    [facility]

## 2020-08-08 NOTE — Patient Instructions (Signed)
Medication Instructions:  1) START CRESTOR (rosuvastatin) 10 mg daily 2) START ASPIRIN 81 mg daily *If you need a refill on your cardiac medications before your next appointment, please call your pharmacy*  Lab Work: Your provider recommends that you return for FASTING labs In 3 months. If you have labs (blood work) drawn today and your tests are completely normal, you will receive your results only by: Marland Kitchen MyChart Message (if you have MyChart) OR . A paper copy in the mail If you have any lab test that is abnormal or we need to change your treatment, we will call you to review the results.  Follow-Up: Your provider recommends that you schedule a follow-up appointment in 3 months to review your blood work.

## 2020-08-09 ENCOUNTER — Other Ambulatory Visit
Admission: RE | Admit: 2020-08-09 | Discharge: 2020-08-09 | Disposition: A | Discharge status: Home / Self Care | Payer: Medicare HMO | Source: Ambulatory Visit | Attending: Internal Medicine | Admitting: Internal Medicine

## 2020-08-09 DIAGNOSIS — Z01812 Encounter for preprocedural laboratory examination: Secondary | ICD-10-CM | POA: Insufficient documentation

## 2020-08-09 DIAGNOSIS — Z20822 Contact with and (suspected) exposure to covid-19: Secondary | ICD-10-CM | POA: Insufficient documentation

## 2020-08-09 LAB — SARS CORONAVIRUS 2 (TAT 6-24 HRS): SARS Coronavirus 2: NEGATIVE

## 2020-08-12 ENCOUNTER — Other Ambulatory Visit: Payer: Medicare HMO

## 2020-08-14 ENCOUNTER — Encounter: Admission: RE | Payer: Self-pay | Source: Home / Self Care

## 2020-08-14 ENCOUNTER — Ambulatory Visit: Admission: RE | Admit: 2020-08-14 | Payer: Medicare HMO | Source: Home / Self Care | Admitting: Internal Medicine

## 2020-08-14 SURGERY — ESOPHAGOGASTRODUODENOSCOPY (EGD) WITH PROPOFOL
Anesthesia: General

## 2020-08-22 ENCOUNTER — Other Ambulatory Visit: Payer: Medicare HMO

## 2020-09-05 ENCOUNTER — Other Ambulatory Visit: Payer: Self-pay

## 2020-09-05 ENCOUNTER — Other Ambulatory Visit
Admission: RE | Admit: 2020-09-05 | Discharge: 2020-09-05 | Disposition: A | Discharge status: Home / Self Care | Payer: Medicare HMO | Source: Ambulatory Visit | Attending: Internal Medicine | Admitting: Internal Medicine

## 2020-09-05 DIAGNOSIS — Z01812 Encounter for preprocedural laboratory examination: Secondary | ICD-10-CM | POA: Diagnosis present

## 2020-09-05 DIAGNOSIS — Z20822 Contact with and (suspected) exposure to covid-19: Secondary | ICD-10-CM | POA: Insufficient documentation

## 2020-09-05 LAB — SARS CORONAVIRUS 2 (TAT 6-24 HRS): SARS Coronavirus 2: NEGATIVE

## 2020-09-06 ENCOUNTER — Encounter: Payer: Self-pay | Admitting: Internal Medicine

## 2020-09-09 ENCOUNTER — Encounter
Admission: RE | Disposition: A | Discharge status: Home / Self Care | Payer: Self-pay | Source: Home / Self Care | Attending: Internal Medicine

## 2020-09-09 ENCOUNTER — Ambulatory Visit: Payer: Medicare HMO | Admitting: Certified Registered Nurse Anesthetist

## 2020-09-09 ENCOUNTER — Ambulatory Visit
Admission: RE | Admit: 2020-09-09 | Discharge: 2020-09-09 | Disposition: A | Discharge status: Home / Self Care | Payer: Medicare HMO | Attending: Internal Medicine | Admitting: Internal Medicine

## 2020-09-09 ENCOUNTER — Encounter: Payer: Self-pay | Admitting: Internal Medicine

## 2020-09-09 ENCOUNTER — Other Ambulatory Visit: Payer: Self-pay

## 2020-09-09 DIAGNOSIS — Z7982 Long term (current) use of aspirin: Secondary | ICD-10-CM | POA: Diagnosis not present

## 2020-09-09 DIAGNOSIS — K5730 Diverticulosis of large intestine without perforation or abscess without bleeding: Secondary | ICD-10-CM | POA: Diagnosis not present

## 2020-09-09 DIAGNOSIS — K3189 Other diseases of stomach and duodenum: Secondary | ICD-10-CM | POA: Insufficient documentation

## 2020-09-09 DIAGNOSIS — Z79899 Other long term (current) drug therapy: Secondary | ICD-10-CM | POA: Insufficient documentation

## 2020-09-09 DIAGNOSIS — K591 Functional diarrhea: Secondary | ICD-10-CM | POA: Insufficient documentation

## 2020-09-09 DIAGNOSIS — K30 Functional dyspepsia: Secondary | ICD-10-CM | POA: Insufficient documentation

## 2020-09-09 DIAGNOSIS — R634 Abnormal weight loss: Secondary | ICD-10-CM | POA: Diagnosis not present

## 2020-09-09 DIAGNOSIS — K641 Second degree hemorrhoids: Secondary | ICD-10-CM | POA: Insufficient documentation

## 2020-09-09 DIAGNOSIS — K2970 Gastritis, unspecified, without bleeding: Secondary | ICD-10-CM | POA: Insufficient documentation

## 2020-09-09 HISTORY — PX: ESOPHAGOGASTRODUODENOSCOPY (EGD) WITH PROPOFOL: SHX5813

## 2020-09-09 HISTORY — PX: COLONOSCOPY WITH PROPOFOL: SHX5780

## 2020-09-09 HISTORY — DX: Unspecified osteoarthritis, unspecified site: M19.90

## 2020-09-09 SURGERY — COLONOSCOPY WITH PROPOFOL
Anesthesia: General

## 2020-09-09 MED ORDER — PROPOFOL 500 MG/50ML IV EMUL
INTRAVENOUS | Status: AC
  Filled 2020-09-09: qty 50

## 2020-09-09 MED ORDER — LIDOCAINE HCL (CARDIAC) PF 100 MG/5ML IV SOSY
PREFILLED_SYRINGE | INTRAVENOUS | Status: DC | PRN
  Administered 2020-09-09: 100 mg via INTRAVENOUS

## 2020-09-09 MED ORDER — SODIUM CHLORIDE 0.9 % IV SOLN: INTRAVENOUS | Status: DC

## 2020-09-09 MED ORDER — PHENYLEPHRINE HCL (PRESSORS) 10 MG/ML IV SOLN
INTRAVENOUS | Status: DC | PRN
  Administered 2020-09-09 (×2): 100 ug via INTRAVENOUS

## 2020-09-09 MED ORDER — PROPOFOL 500 MG/50ML IV EMUL
INTRAVENOUS | Status: DC | PRN
  Administered 2020-09-09: 160 ug/kg/min via INTRAVENOUS

## 2020-09-09 MED ORDER — PROPOFOL 10 MG/ML IV BOLUS
INTRAVENOUS | Status: DC | PRN
  Administered 2020-09-09: 60 mg via INTRAVENOUS

## 2020-09-09 NOTE — Interval H&P Note (Signed)
History and Physical Interval Note:  09/09/2020 9:14 AM  Bethany Grant  has presented today for surgery, with the diagnosis of ANEMIA GERD DIARRHEA.  The various methods of treatment have been discussed with the patient and family. After consideration of risks, benefits and other options for treatment, the patient has consented to  Procedure(s): COLONOSCOPY WITH PROPOFOL (N/A) ESOPHAGOGASTRODUODENOSCOPY (EGD) WITH PROPOFOL (N/A) as a surgical intervention.  The patient's history has been reviewed, patient examined, no change in status, stable for surgery.  I have reviewed the patient's chart and labs.  Questions were answered to the patient's satisfaction.     [place], [place]

## 2020-09-09 NOTE — H&P (Signed)
Outpatient short stay form Pre-procedure 09/09/2020 9:11 AM Bethany Grant, M.D.  Primary Physician: Frazier Richards III, M.D.  Reason for visit:  Functional diarrhea, GERD, dyspepsia, abnormal weight loss  History of present illness:  .80 y/o female presents for abnormal weight loss, GERD, epigastric discomfort. No bleeding. Has normocytic anemia. Has loose stools chronically, stool studies were negative. Last colonoscopy several years ago in [place] was not found.    Current Facility-Administered Medications:  .  0.9 %  sodium chloride infusion, , Intravenous, Continuous, Shadell Brenn, Benay Pike, MD  Medications Prior to Admission  Medication Sig Dispense Refill Last Dose  . ALPRAZolam (XANAX) 0.25 MG tablet Take 0.25 mg by mouth at bedtime.   09/08/2020 at Unknown time  . aspirin EC 81 MG tablet Take 1 tablet (81 mg total) by mouth daily. Swallow whole. 90 tablet 3 Past Week at Unknown time  . Cholecalciferol (VITAMIN D3) 25 MCG (1000 UT) CAPS Take 1,000 Units by mouth daily.   09/08/2020 at Unknown time  . ciclopirox (LOPROX) 0.77 % cream as needed.   Past Week at Unknown time  . cycloSPORINE (RESTASIS) 0.05 % ophthalmic emulsion Place 1 drop into both eyes 2 (two) times daily.   09/08/2020 at Unknown time  . dicyclomine (BENTYL) 10 MG capsule Take 10 mg by mouth 4 (four) times daily -  before meals and at bedtime.    at prn  . fluticasone (FLONASE) 50 MCG/ACT nasal spray Place 1 spray into both nostrils daily as needed for allergies or rhinitis.   09/08/2020 at Unknown time  . folic acid (FOLVITE) 1 MG tablet Take 1 mg by mouth daily.   09/08/2020 at Unknown time  . hydroxychloroquine (PLAQUENIL) 200 MG tablet Take 400 mg by mouth daily.   09/08/2020 at Unknown time  . losartan (COZAAR) 50 MG tablet Take 50 mg by mouth daily.   09/08/2020 at Unknown time  . methotrexate (RHEUMATREX) 2.5 MG tablet Take 2.5 mg by mouth See admin instructions. Take 7.5 mg by mouth daily on Friday and take 7.5  mg by mouth daily on Saturday.   09/08/2020 at Unknown time  . mupirocin ointment (BACTROBAN) 2 % Apply 1 application topically daily. 22 g 0 09/08/2020 at Unknown time  . rosuvastatin (CRESTOR) 10 MG tablet Take 1 tablet (10 mg total) by mouth daily. 90 tablet 3 09/08/2020 at Unknown time  . ciprofloxacin (CIPRO) 500 MG tablet Take 1 tablet (500 mg total) by mouth 2 (two) times daily. One po bid x 7 days (Patient not taking: Reported on 09/09/2020) 14 tablet 0 Completed Course at Unknown time  . famotidine (PEPCID) 20 MG tablet Take 20 mg by mouth 2 (two) times daily as needed. (Patient not taking: Reported on 09/09/2020)   Not Taking at Unknown time  . hydrOXYzine (ATARAX/VISTARIL) 25 MG tablet Take 25-50 mg by mouth at bedtime as needed.    at prn  . ibuprofen (ADVIL,MOTRIN) 200 MG tablet Take 200 mg by mouth every 6 (six) hours as needed for headache or moderate pain.    at orn  . levocetirizine (XYZAL) 5 MG tablet Take 5 mg by mouth every evening. (Patient not taking: Reported on 09/09/2020)   Not Taking at Unknown time  . metroNIDAZOLE (FLAGYL) 500 MG tablet Take 1 tablet (500 mg total) by mouth 2 (two) times daily. One po bid x 7 days (Patient not taking: Reported on 09/09/2020) 14 tablet 0 Completed Course at Unknown time  . nitroGLYCERIN (NITROSTAT) 0.4 MG SL tablet  Place 1 tablet (0.4 mg total) under the tongue every 5 (five) minutes as needed for chest pain. 25 tablet 3   . ondansetron (ZOFRAN ODT) 4 MG disintegrating tablet 4mg  ODT q4 hours prn nausea/vomit (Patient not taking: Reported on 09/09/2020) 10 tablet 0 Not Taking at Unknown time  . pantoprazole (PROTONIX) 40 MG tablet Take 40 mg by mouth daily. (Patient not taking: Reported on 09/09/2020)   Not Taking at Unknown time     Allergies  Allergen Reactions  . Other Nausea And Vomiting and Other (See Comments)    Doesn't do well with pain  Medications, sensitivity to tape     Past Medical History:  Diagnosis Date  . Arthritis   .  Hypercholesterolemia   . Hypertension   . Osteopenia 09/2017   T score -2.1 FRAX 18% / 2.7%  . PONV (postoperative nausea and vomiting)   . Rheumatoid arthritis(714.0)     Review of systems:  Otherwise negative.    Physical Exam  Gen: Alert, oriented. Appears stated age.  HEENT: [place]/AT. PERRLA. Lungs: CTA, no wheezes. CV: RR nl S1, S2. Abd: soft, benign, no masses. BS+ Ext: No edema. Pulses 2+    Planned procedures: Proceed with EGD and colonoscopy. The patient understands the nature of the planned procedure, indications, risks, alternatives and potential complications including but not limited to bleeding, infection, perforation, damage to internal organs and possible oversedation/side effects from anesthesia. The patient agrees and gives consent to proceed.  Please refer to procedure notes for findings, recommendations and patient disposition/instructions.     Bethany Grant, M.D. Gastroenterology 09/09/2020  9:11 AM

## 2020-09-09 NOTE — Anesthesia Preprocedure Evaluation (Signed)
Anesthesia Evaluation  Patient identified by MRN, date of birth, ID band Patient awake    Reviewed: Allergy & Precautions, H&P , NPO status , Patient's Chart, lab work & pertinent test results, reviewed documented beta blocker date and time   History of Anesthesia Complications (+) PONV and history of anesthetic complications  Airway Mallampati: II  TM Distance: >3 FB Neck ROM: full    Dental  (+) Dental Advidsory Given, Caps, Missing Bridge on top left:   Pulmonary neg shortness of breath, neg COPD, neg recent URI, former smoker,    Pulmonary exam normal breath sounds clear to auscultation       Cardiovascular Exercise Tolerance: Good hypertension, (-) angina(-) Past MI and (-) Cardiac Stents Normal cardiovascular exam+ dysrhythmias (LAFB, RBBB) (-) Valvular Problems/Murmurs Rhythm:regular Rate:Normal     Neuro/Psych negative neurological ROS  negative psych ROS   GI/Hepatic negative GI ROS, Neg liver ROS,   Endo/Other  negative endocrine ROS  Renal/GU negative Renal ROS  negative genitourinary   Musculoskeletal   Abdominal   Peds  Hematology negative hematology ROS (+)   Anesthesia Other Findings Past Medical History: No date: Arthritis No date: Hypercholesterolemia No date: Hypertension 09/2017: Osteopenia     Comment:  T score -2.1 FRAX 18% / 2.7% No date: PONV (postoperative nausea and vomiting) No date: Rheumatoid arthritis(714.0)   Reproductive/Obstetrics negative OB ROS                             Anesthesia Physical Anesthesia Plan  ASA: II  Anesthesia Plan: General   Post-op Pain Management:    Induction: Intravenous  PONV Risk Score and Plan: 4 or greater and TIVA and Propofol infusion  Airway Management Planned: Natural Airway and Nasal Cannula  Additional Equipment:   Intra-op Plan:   Post-operative Plan:   Informed Consent: I have reviewed the patients  History and Physical, chart, labs and discussed the procedure including the risks, benefits and alternatives for the proposed anesthesia with the patient or authorized representative who has indicated his/her understanding and acceptance.     Dental Advisory Given  Plan Discussed with: Anesthesiologist, CRNA and Surgeon  Anesthesia Plan Comments:         Anesthesia Quick Evaluation

## 2020-09-09 NOTE — Transfer of Care (Signed)
Immediate Anesthesia Transfer of Care Note  Patient: Bethany Grant  Procedure(s) Performed: COLONOSCOPY WITH PROPOFOL (N/A ) ESOPHAGOGASTRODUODENOSCOPY (EGD) WITH PROPOFOL (N/A )  Patient Location: PACU  Anesthesia Type:General  Level of Consciousness: awake and alert   Airway & Oxygen Therapy: Patient Spontanous Breathing and Patient connected to nasal cannula oxygen  Post-op Assessment: Report given to RN and Post -op Vital signs reviewed and stable  Post vital signs: Reviewed and stable  Last Vitals:  Vitals Value Taken Time  BP 105/39 09/09/20 1017  Temp    Pulse 73 09/09/20 1019  Resp 14 09/09/20 1019  SpO2 91 % 09/09/20 1019  Vitals shown include unvalidated device data.  Last Pain:  Vitals:   09/09/20 0856  TempSrc: Temporal  PainSc: 0-No pain         Complications: No complications documented.

## 2020-09-09 NOTE — Op Note (Signed)
[facility] Gastroenterology Patient Name: Bethany Grant Procedure Date: 09/09/2020 9:35 AM MRN: [id number] Account #: [id number] Date of Birth: 1940-12-02 Admit Type: Outpatient Age: 80 Room: [facility] ENDO ROOM 2 Gender: Female Note Status: Finalized Procedure:             Colonoscopy Indications:           Functional diarrhea, Weight loss Providers:             Benay Pike. Julina Altmann MD, MD Medicines:             Propofol per Anesthesia Complications:         No immediate complications. Procedure:             Pre-Anesthesia Assessment:                        - The risks and benefits of the procedure and the                         sedation options and risks were discussed with the                         patient. All questions were answered and informed                         consent was obtained.                        - Patient identification and proposed procedure were                         verified prior to the procedure by the nurse. The                         procedure was verified in the pre-procedure area.                        - ASA Grade Assessment: III - A patient with severe                         systemic disease.                        - After reviewing the risks and benefits, the patient                         was deemed in satisfactory condition to undergo the                         procedure.                        After obtaining informed consent, the colonoscope was                         passed under direct vision. Throughout the procedure,                         the patient's blood pressure, pulse, and oxygen  saturations were monitored continuously. The                         Colonoscope was introduced through the anus and                         advanced to the the cecum, identified by appendiceal                         orifice and ileocecal valve. The colonoscopy was                         technically difficult  and complex due to restricted                         mobility of the colon, a redundant colon and                         significant looping. Successful completion of the                         procedure was aided by straightening and shortening                         the scope to obtain bowel loop reduction and applying                         abdominal pressure. The patient tolerated the                         procedure well. The quality of the bowel preparation                         was adequate. The ileocecal valve, appendiceal                         orifice, and rectum were photographed. Findings:      The perianal exam findings include internal hemorrhoids that prolapse       with straining, but spontaneously regress to the resting position (Grade       II).      The digital rectal exam was normal. Pertinent negatives include normal       sphincter tone.      Many small-mouthed diverticula were found in the sigmoid colon.      Normal mucosa was found in the entire colon. Biopsies for histology were       taken with a cold forceps from the random colon for evaluation of       microscopic colitis.      Non-bleeding internal hemorrhoids were found during retroflexion. The       hemorrhoids were Grade I (internal hemorrhoids that do not prolapse).      The exam was otherwise without abnormality. Impression:            - Internal hemorrhoids that prolapse with straining,                         but spontaneously regress to the resting position                         (  Grade II) found on perianal exam.                        - Diverticulosis in the sigmoid colon.                        - Normal mucosa in the entire examined colon. Biopsied.                        - Non-bleeding internal hemorrhoids.                        - The examination was otherwise normal. Recommendation:        - Await pathology results from EGD, also performed                         today.                         - Patient has a contact number available for                         emergencies. The signs and symptoms of potential                         delayed complications were discussed with the patient.                         Return to normal activities tomorrow. Written                         discharge instructions were provided to the patient.                        - Resume previous diet.                        - Continue present medications.                        - Await pathology results.                        - No repeat colonoscopy due to current age (21 years                         or older) and the absence of advanced adenomas.                        - Patient says diarrhea, GERD, dyspepsia are currently                         'not a problem right now'. Patient reports weight loss                         has subsided.                        - You do NOT require further colon cancer screening  measures (Annual stool testing (i.e. hemoccult, FIT,                         cologuard), sigmoidoscopy, colonoscopy or CT                         colonography). You should share this recommendation                         with your Primary Care provider.                        - Patient is NOT iron deficient. No further workup of                         anemia required from a GI standpoint.                        - Return to GI office PRN.                        - The findings and recommendations were discussed with                         the patient. Procedure Code(s):     --- Professional ---                        [id number], Colonoscopy, flexible; with biopsy, single or                         multiple Diagnosis Code(s):     --- Professional ---                        K57.30, Diverticulosis of large intestine without                         perforation or abscess without bleeding                        R63.4, Abnormal weight loss                         K59.1, Functional diarrhea                        K64.1, Second degree hemorrhoids CPT copyright 2019 American Medical Association. All rights reserved. The codes documented in this report are preliminary and upon coder review may  be revised to meet current compliance requirements. Efrain Sella MD, MD 09/09/2020 10:21:04 AM This report has been signed electronically. Number of Addenda: 0 Note Initiated On: 09/09/2020 9:35 AM Scope Withdrawal Time: 0 hours 5 minutes 20 seconds  Total Procedure Duration: 0 hours 17 minutes 38 seconds  Estimated Blood Loss:  Estimated blood loss: none. Estimated blood loss: none.      [facility]

## 2020-09-09 NOTE — Op Note (Signed)
[facility] Gastroenterology Patient Name: Bethany Grant Procedure Date: 09/09/2020 9:36 AM MRN: [id number] Account #: [id number] Date of Birth: 09-28-1940 Admit Type: Outpatient Age: 80 Room: [facility] ENDO ROOM 2 Gender: Female Note Status: Finalized Procedure:             Upper GI endoscopy Indications:           Functional Dyspepsia, Suspected esophageal reflux,                         Weight loss Providers:             Benay Pike. [place] MD, MD Medicines:             Propofol per Anesthesia Complications:         No immediate complications. Procedure:             Pre-Anesthesia Assessment:                        - The risks and benefits of the procedure and the                         sedation options and risks were discussed with the                         patient. All questions were answered and informed                         consent was obtained.                        - Patient identification and proposed procedure were                         verified prior to the procedure by the nurse. The                         procedure was verified in the procedure room.                        - ASA Grade Assessment: III - A patient with severe                         systemic disease.                        - After reviewing the risks and benefits, the patient                         was deemed in satisfactory condition to undergo the                         procedure.                        After obtaining informed consent, the endoscope was                         passed under direct vision. Throughout the procedure,  the patient's blood pressure, pulse, and oxygen                         saturations were monitored continuously. The Endoscope                         was introduced through the mouth, and advanced to the                         third part of duodenum. The upper GI endoscopy was                         accomplished without  difficulty. The patient tolerated                         the procedure well. Findings:      The esophagus was normal.      Patchy mild inflammation characterized by congestion (edema) and       erythema was found in the gastric antrum. Biopsies were taken with a       cold forceps for Helicobacter pylori testing.      The cardia and gastric fundus were normal on retroflexion.      Patchy mildly erythematous mucosa without active bleeding and with no       stigmata of bleeding was found in the entire duodenum.      The exam was otherwise without abnormality. Impression:            - Normal esophagus.                        - Gastritis. Biopsied.                        - Erythematous duodenopathy.                        - The examination was otherwise normal. Recommendation:        - Await pathology results.                        - Proceed with colonoscopy Procedure Code(s):     --- Professional ---                        [phone], Esophagogastroduodenoscopy, flexible,                         transoral; with biopsy, single or multiple Diagnosis Code(s):     --- Professional ---                        R63.4, Abnormal weight loss                        K30, Functional dyspepsia                        K31.89, Other diseases of stomach and duodenum                        K29.70, Gastritis, unspecified, without bleeding CPT copyright 2019 American Medical Association. All rights reserved. The codes  documented in this report are preliminary and upon coder review may  be revised to meet current compliance requirements. Efrain Sella MD, MD 09/09/2020 9:55:06 AM This report has been signed electronically. Number of Addenda: 0 Note Initiated On: 09/09/2020 9:36 AM Estimated Blood Loss:  Estimated blood loss: none.      [facility]

## 2020-09-10 LAB — SURGICAL PATHOLOGY

## 2020-09-10 NOTE — Anesthesia Postprocedure Evaluation (Signed)
Anesthesia Post Note  Patient: BRONDA ALFRED  Procedure(s) Performed: COLONOSCOPY WITH PROPOFOL (N/A ) ESOPHAGOGASTRODUODENOSCOPY (EGD) WITH PROPOFOL (N/A )  Patient location during evaluation: Endoscopy Anesthesia Type: General Level of consciousness: awake and alert Pain management: pain level controlled Vital Signs Assessment: post-procedure vital signs reviewed and stable Respiratory status: spontaneous breathing, nonlabored ventilation, respiratory function stable and patient connected to nasal cannula oxygen Cardiovascular status: blood pressure returned to baseline and stable Postop Assessment: no apparent nausea or vomiting Anesthetic complications: no   No complications documented.   Last Vitals:  Vitals:   09/09/20 1028 09/09/20 1038  BP: (!) 86/59   Pulse: 67   Resp: 17   Temp:    SpO2: 100% 100%    Last Pain:  Vitals:   09/09/20 1038  TempSrc:   PainSc: 0-No pain                 Martha Clan

## 2020-09-17 ENCOUNTER — Ambulatory Visit (INDEPENDENT_AMBULATORY_CARE_PROVIDER_SITE_OTHER): Payer: Medicare HMO

## 2020-09-17 ENCOUNTER — Encounter: Payer: Self-pay | Admitting: Pulmonary Disease

## 2020-09-17 ENCOUNTER — Ambulatory Visit: Payer: Medicare HMO | Admitting: Pulmonary Disease

## 2020-09-17 ENCOUNTER — Other Ambulatory Visit: Payer: Self-pay

## 2020-09-17 VITALS — BP 94/52 | HR 62 | Temp 97.6°F | Ht 67.0 in | Wt 156.4 lb

## 2020-09-17 DIAGNOSIS — M0510 Rheumatoid lung disease with rheumatoid arthritis of unspecified site: Secondary | ICD-10-CM

## 2020-09-17 DIAGNOSIS — J849 Interstitial pulmonary disease, unspecified: Secondary | ICD-10-CM

## 2020-09-17 NOTE — Patient Instructions (Signed)
Follow up in 1 year.

## 2020-09-17 NOTE — Progress Notes (Signed)
[facility]  Chief Complaint  Patient presents with  . Follow-up    No complaints currently    Constitutional:  BP (!) 94/52 (BP Location: Right Arm, Cuff Size: Normal)   Pulse 62   Temp 97.6 F (36.4 C) (Temporal)   Ht 5\' 7"  (1.702 m)   Wt 156 lb 6.4 oz (70.9 kg)   LMP  (LMP Unknown)   SpO2 98% Comment: Room air  BMI 24.50 kg/m   Past Medical History:  Endometriosis, HTN, HLD, RA, RBBB, Anxiety  Past Surgical History:  She  has a past surgical history that includes Abdominal hysterectomy (1991); Hemorrhoid surgery; Hand surgery; Foot surgery; Breast surgery (1988); Bartholin gland cyst excision (1980); Oophorectomy; Colonoscopy; Colonoscopy (N/A, 08/24/2013); Olecranon bursectomy (Right, 11/29/2013); Tenosynovectomy (Right, 11/29/2013); Foot surgery (Left); Weil osteotomy (Right, 08/10/2018); Colonoscopy with propofol (N/A, 09/09/2020); and Esophagogastroduodenoscopy (egd) with propofol (N/A, 09/09/2020).  Brief Summary:  Bethany Grant is a 80 y.o. female former smoker with lung nodules, family history of lung cancer, and history of rheumatoid arthritis.      Subjective:   She had rash, cough, chest congestion, low grade fever in January 2022.  Had cardiac CT that showed changes suggestive of viral pneumonitis.  Since then her symptoms have resolved.  Negative for COVID.  Having more joint issues.  Dues to see Dr. Amil Amen next month.  Physical Exam:   Appearance - well kempt   ENMT - no sinus tenderness, no oral exudate, no LAN, Mallampati 2 airway, no stridor  Respiratory - equal breath sounds bilaterally, no wheezing or rales  CV - s1s2 regular rate and rhythm, no murmurs  Ext - changes of RA in her hands  Skin - no rashes  Psych - normal mood and affect   Pulmonary testing:   Bronchoscopy Oct 2009>>Pneumatocyte atypia suggestive of obstructive pneumonitis, can be seen with RA in the lungs  PFT 06/19/08>>FEV1  2.58(107%), FEV1% 71, TLC 6.07(111%), DLCO 96%, no BD  Chest Imaging:   CT chest 01/25/08>>4 mm Lt lower lobe nodule, hazy reticular infiltrate LLL  CT chest 04/18/08>>increase in LLL reticular infiltrate  CT chest 05/23/09>>3.8 x 3.6 cm ascending aorta, scoliosis, decreased size LLL nodule, reticular changes LLL decreased  Coronary CT chest 08/05/20 >> bullous disease in Lt lung base, b/l mild peripheral GGO  Cardiac Tests:   Echo 08/06/20 >> EF 60 to 65%  Social History:  She  reports that she quit smoking about 16 years ago. Her smoking use included cigarettes. She has a 24.00 pack-year smoking history. She has never used smokeless tobacco. She reports that she does not drink alcohol and does not use drugs.  Family History:  Her family history includes Cancer in her brother, mother, sister, and sister; Hypertension in her sister.     Assessment/Plan:   History of tobacco abuse, and family history of lung cancer with prior imaging studies showing reticulonodular infiltrates in setting of rheumatoid arthritis. - cardiac CT findings from January 2022 likely represented viral pneumonitis; clinically improved and chest xray today unremarkable - will plan for repeat chest xray in 2023  Rheumatoid arthritis. - she remains on MTX and plaquenil and is followed by Dr. Amil Amen - she reports progressive symptoms  Time Spent Involved in Patient Care on Day of Examination:  23 minutes  Follow up:  Patient Instructions  Follow up in 1 year   Medication List:   Allergies as of 09/17/2020      Reactions  Other Nausea And Vomiting, Other (See Comments)   Doesn't do well with pain  Medications, sensitivity to tape      Medication List       Accurate as of September 17, 2020 12:09 PM. If you have any questions, ask your nurse or doctor.        STOP taking these medications   ciprofloxacin 500 MG tablet Commonly known as: Cipro Stopped by: Chesley Mires, MD    metroNIDAZOLE 500 MG tablet Commonly known as: Flagyl Stopped by: Chesley Mires, MD     TAKE these medications   ALPRAZolam 0.25 MG tablet Commonly known as: XANAX Take 0.25 mg by mouth at bedtime.   aspirin EC 81 MG tablet Take 1 tablet (81 mg total) by mouth daily. Swallow whole.   ciclopirox 0.77 % cream Commonly known as: LOPROX as needed.   cycloSPORINE 0.05 % ophthalmic emulsion Commonly known as: RESTASIS Place 1 drop into both eyes 2 (two) times daily.   dicyclomine 10 MG capsule Commonly known as: BENTYL Take 10 mg by mouth 4 (four) times daily -  before meals and at bedtime.   famotidine 20 MG tablet Commonly known as: PEPCID Take 20 mg by mouth 2 (two) times daily as needed.   fluticasone 50 MCG/ACT nasal spray Commonly known as: FLONASE Place 1 spray into both nostrils daily as needed for allergies or rhinitis.   folic acid 1 MG tablet Commonly known as: FOLVITE Take 1 mg by mouth daily.   hydroxychloroquine 200 MG tablet Commonly known as: PLAQUENIL Take 400 mg by mouth daily.   hydrOXYzine 25 MG tablet Commonly known as: ATARAX/VISTARIL Take 25-50 mg by mouth at bedtime as needed.   ibuprofen 200 MG tablet Commonly known as: ADVIL Take 200 mg by mouth every 6 (six) hours as needed for headache or moderate pain.   levocetirizine 5 MG tablet Commonly known as: XYZAL Take 5 mg by mouth every evening.   losartan 50 MG tablet Commonly known as: COZAAR Take 50 mg by mouth daily.   methotrexate 2.5 MG tablet Commonly known as: RHEUMATREX Take 2.5 mg by mouth See admin instructions. Take 7.5 mg by mouth daily on Friday and take 7.5 mg by mouth daily on Saturday.   mupirocin ointment 2 % Commonly known as: Bactroban Apply 1 application topically daily.   nitroGLYCERIN 0.4 MG SL tablet Commonly known as: NITROSTAT Place 1 tablet (0.4 mg total) under the tongue every 5 (five) minutes as needed for chest pain.   ondansetron 4 MG disintegrating  tablet Commonly known as: Zofran ODT 4mg  ODT q4 hours prn nausea/vomit   pantoprazole 40 MG tablet Commonly known as: PROTONIX Take 40 mg by mouth daily.   rosuvastatin 10 MG tablet Commonly known as: CRESTOR Take 1 tablet (10 mg total) by mouth daily.   Vitamin D3 25 MCG (1000 UT) Caps Take 1,000 Units by mouth daily.       Signature:  Chesley Mires, MD [place] Pager - [phone] 09/17/2020, 12:09 PM

## 2020-11-04 ENCOUNTER — Other Ambulatory Visit: Payer: Medicare HMO

## 2020-11-06 ENCOUNTER — Ambulatory Visit: Payer: Medicare HMO | Admitting: Cardiology

## 2020-11-25 ENCOUNTER — Other Ambulatory Visit: Payer: Medicare HMO | Admitting: *Deleted

## 2020-11-25 ENCOUNTER — Other Ambulatory Visit: Payer: Self-pay

## 2020-11-25 DIAGNOSIS — I2584 Coronary atherosclerosis due to calcified coronary lesion: Secondary | ICD-10-CM

## 2020-11-25 DIAGNOSIS — I2510 Atherosclerotic heart disease of native coronary artery without angina pectoris: Secondary | ICD-10-CM

## 2020-11-25 LAB — LIPID PANEL
Chol/HDL Ratio: 2.2 ratio (ref 0.0–4.4)
Cholesterol, Total: 96 mg/dL — ABNORMAL LOW (ref 100–199)
HDL: 44 mg/dL (ref >39)
LDL Chol Calc (NIH): 39 mg/dL (ref 0–99)
Triglycerides: 51 mg/dL (ref 0–149)
VLDL Cholesterol Cal: 13 mg/dL (ref 5–40)

## 2020-11-25 LAB — ALT: ALT: 12 IU/L (ref 0–32)

## 2020-11-27 ENCOUNTER — Encounter (INDEPENDENT_AMBULATORY_CARE_PROVIDER_SITE_OTHER): Payer: Self-pay

## 2020-11-27 ENCOUNTER — Encounter: Payer: Self-pay | Admitting: Cardiology

## 2020-11-27 ENCOUNTER — Ambulatory Visit: Payer: Medicare HMO | Admitting: Cardiology

## 2020-11-27 ENCOUNTER — Other Ambulatory Visit: Payer: Self-pay

## 2020-11-27 VITALS — BP 110/70 | HR 66 | Ht 67.0 in | Wt 159.0 lb

## 2020-11-27 DIAGNOSIS — I2510 Atherosclerotic heart disease of native coronary artery without angina pectoris: Secondary | ICD-10-CM | POA: Diagnosis not present

## 2020-11-27 DIAGNOSIS — M069 Rheumatoid arthritis, unspecified: Secondary | ICD-10-CM

## 2020-11-27 DIAGNOSIS — I4510 Unspecified right bundle-branch block: Secondary | ICD-10-CM

## 2020-11-27 DIAGNOSIS — I444 Left anterior fascicular block: Secondary | ICD-10-CM | POA: Diagnosis not present

## 2020-11-27 DIAGNOSIS — I2584 Coronary atherosclerosis due to calcified coronary lesion: Secondary | ICD-10-CM

## 2020-11-27 MED ORDER — LOSARTAN POTASSIUM 50 MG PO TABS: 50.0000 mg | ORAL_TABLET | Freq: Every day | ORAL | 3 refills | Status: DC

## 2020-11-27 NOTE — Patient Instructions (Signed)
Medication Instructions:  The current medical regimen is effective;  continue present plan and medications.  *If you need a refill on your cardiac medications before your next appointment, please call your pharmacy*  Follow-Up: At [facility], you and your health needs are our priority.  As part of our continuing mission to provide you with exceptional heart care, we have created designated Provider Care Teams.  These Care Teams include your primary Cardiologist (physician) and Advanced Practice Providers (APPs -  Physician Assistants and Nurse Practitioners) who all work together to provide you with the care you need, when you need it.  We recommend signing up for the patient portal called "MyChart".  Sign up information is provided on this After Visit Summary.  MyChart is used to connect with patients for Virtual Visits (Telemedicine).  Patients are able to view lab/test results, encounter notes, upcoming appointments, etc.  Non-urgent messages can be sent to your provider as well.   To learn more about what you can do with MyChart, go to [email or web address].    Your next appointment:   12 month(s)  The format for your next appointment:   In Person  Provider:   Mark Skains, MD   Thank you for choosing [facility]!!      

## 2020-11-27 NOTE — Progress Notes (Signed)
Cardiology Office Note:    Date:  11/27/2020   ID:  Bethany Grant, DOB 12-25-1940, MRN [id number]  PCP:  Kirk Ruths, MD   [facility] Providers Cardiologist:  Candee Furbish, MD     Referring MD: Kirk Ruths, MD   History of Present Illness:    Bethany Grant is a 80 y.o. female here for follow up of hypertension and coronary artery disease. She is doing well today. She recently chagned her PCP and she has not been able to reach her new PCP to refill her 50 mg Losartan PO daily. She is requesting for a refill on her Losartan to manage her blood pressure. She has no new complains. She denies having any exertional shortness of breath, chest pain, tightness, or pressure. She has no LE edema, lightheadedness, PND, orthopnea, or syncopal episodes.   Past Medical History:  Diagnosis Date  . Arthritis   . Hypercholesterolemia   . Hypertension   . Osteopenia 09/2017   T score -2.1 FRAX 18% / 2.7%  . PONV (postoperative nausea and vomiting)   . Rheumatoid arthritis(714.0)     Past Surgical History:  Procedure Laterality Date  . ABDOMINAL HYSTERECTOMY  1991   TAH/BSO  . BARTHOLIN GLAND CYST EXCISION  1980   RIGHT  . BREAST SURGERY  1988   BREAST CYST  . COLONOSCOPY    . COLONOSCOPY N/A 08/24/2013   Procedure: COLONOSCOPY;  Surgeon: Rogene Houston, MD;  [facility];  Service: Endoscopy;  Laterality: N/A;  100  . COLONOSCOPY WITH PROPOFOL N/A 09/09/2020   Procedure: COLONOSCOPY WITH PROPOFOL;  Surgeon: Toledo, Benay Pike, MD;  Location: [facility];  Service: Gastroenterology;  Laterality: N/A;  . ESOPHAGOGASTRODUODENOSCOPY (EGD) WITH PROPOFOL N/A 09/09/2020   Procedure: ESOPHAGOGASTRODUODENOSCOPY (EGD) WITH PROPOFOL;  Surgeon: Toledo, Benay Pike, MD;  Location: [facility];  Service: Gastroenterology;  Laterality: N/A;  . FOOT SURGERY     2 TIMES  . FOOT SURGERY Left   . HAND SURGERY    . HEMORRHOID SURGERY     been over 20 years ago  . OLECRANON  BURSECTOMY Right 11/29/2013   Procedure: EXCISION OLECRANON BURSA RIGHT ;  Surgeon: Wynonia Sours, MD;  Location: [place];  Service: Orthopedics;  Laterality: Right;  . OOPHORECTOMY     BSO  . TENOSYNOVECTOMY Right 11/29/2013   Procedure: TENOSYNOVECTOMY FLEXOR TENDON RIGHT WRIST ;  Surgeon: Wynonia Sours, MD;  Location: [place];  Service: Orthopedics;  Laterality: Right;  . WEIL OSTEOTOMY Right 08/10/2018   Procedure: WEIL OSTEOTOMY 2, 3,4 METATARSAL RIGHT FOOT, PROXIMAL INTERPHALANGEAL RESECTION TOES 2, 3, 4;  Surgeon: Newt Minion, MD;  Location: [place];  Service: Orthopedics;  Laterality: Right;    Current Medications: Current Meds  Medication Sig  . ALPRAZolam (XANAX) 0.25 MG tablet Take 0.25 mg by mouth at bedtime.  Marland Kitchen aspirin EC 81 MG tablet Take 1 tablet (81 mg total) by mouth daily. Swallow whole.  . Cholecalciferol (VITAMIN D3) 25 MCG (1000 UT) CAPS Take 1,000 Units by mouth daily.  . ciclopirox (LOPROX) 0.77 % cream as needed.  . cycloSPORINE (RESTASIS) 0.05 % ophthalmic emulsion Place 1 drop into both eyes 2 (two) times daily.  Marland Kitchen dicyclomine (BENTYL) 10 MG capsule Take 10 mg by mouth 4 (four) times daily -  before meals and at bedtime.  . famotidine (PEPCID) 20 MG tablet Take 20 mg by mouth 2 (two) times daily as needed.  Marland Kitchen  fluticasone (FLONASE) 50 MCG/ACT nasal spray Place 1 spray into both nostrils daily as needed for allergies or rhinitis.  . folic acid (FOLVITE) 1 MG tablet Take 1 mg by mouth daily.  . hydroxychloroquine (PLAQUENIL) 200 MG tablet Take 400 mg by mouth daily.  . hydrOXYzine (ATARAX/VISTARIL) 25 MG tablet Take 25-50 mg by mouth at bedtime as needed.  Marland Kitchen ibuprofen (ADVIL,MOTRIN) 200 MG tablet Take 200 mg by mouth every 6 (six) hours as needed for headache or moderate pain.  Marland Kitchen levocetirizine (XYZAL) 5 MG tablet Take 5 mg by mouth every evening.  . loratadine (CLARITIN) 10 MG tablet Take 10 mg by mouth as needed.  . methotrexate  (RHEUMATREX) 2.5 MG tablet Take 2.5 mg by mouth See admin instructions. Take 7.5 mg by mouth daily on Friday and take 7.5 mg by mouth daily on Saturday.  . mupirocin ointment (BACTROBAN) 2 % Apply 1 application topically daily.  . nitroGLYCERIN (NITROSTAT) 0.4 MG SL tablet Place 1 tablet (0.4 mg total) under the tongue every 5 (five) minutes as needed for chest pain.  Marland Kitchen ondansetron (ZOFRAN ODT) 4 MG disintegrating tablet 4mg  ODT q4 hours prn nausea/vomit  . pantoprazole (PROTONIX) 40 MG tablet Take 40 mg by mouth daily.  . rosuvastatin (CRESTOR) 10 MG tablet Take 1 tablet (10 mg total) by mouth daily.  . [DISCONTINUED] losartan (COZAAR) 50 MG tablet Take 50 mg by mouth daily.     Allergies:   Other   Social History   Socioeconomic History  . Marital status: Married    Spouse name: Not on file  . Number of children: Not on file  . Years of education: Not on file  . Highest education level: Not on file  Occupational History  . Not on file  Tobacco Use  . Smoking status: Former Smoker    Packs/day: 0.80    Years: 30.00    Pack years: 24.00    Types: Cigarettes    Quit date: 07/27/2004    Years since quitting: 16.3  . Smokeless tobacco: Never Used  Vaping Use  . Vaping Use: Never used  Substance and Sexual Activity  . Alcohol use: No    Alcohol/week: 0.0 standard drinks  . Drug use: No  . Sexual activity: Not Currently    Birth control/protection: Surgical    Comment: 1st intercourse 80 yo-Fewer than 5 partners  Other Topics Concern  . Not on file  Social History Narrative  . Not on file   Social Determinants of Health   Financial Resource Strain: Not on file  Food Insecurity: Not on file  Transportation Needs: Not on file  Physical Activity: Not on file  Stress: Not on file  Social Connections: Not on file     Family History: The patient's family history includes Cancer in her brother, mother, sister, and sister; Hypertension in her sister. There is no history of  Colon cancer.  ROS:   Please see the history of present illness. All other systems reviewed and are negative.  EKGs/Labs/Other Studies Reviewed:    The following studies were reviewed today: ECHO(08/06/20): 1. Left ventricular ejection fraction, by estimation, is 60 to 65%. Left ventricular ejection fraction by 3D volume is 63 %. The left ventricle has normal function. The left ventricle has no regional wall motion abnormalities. Left ventricular diastolic parameters were normal. The average left ventricular global longitudinal strain is -23.5 %. The global longitudinal strain is normal. 2. Right ventricular systolic function is normal. The right ventricular size is  normal. There is normal pulmonary artery systolic pressure. The estimated right ventricular systolic pressure is 82.9 mmHg. 3. The mitral valve is grossly normal. Trivial mitral valve regurgitation. No evidence of mitral stenosis. 4. The aortic valve is tricuspid. There is mild calcification of the aortic valve. Aortic valve regurgitation is not visualized. No aortic stenosis is present. 5. The inferior vena cava is normal in size with greater than 50% respiratory variability, suggesting right atrial pressure of 3 mmHg.  CT 1. Coronary calcium score of 12.3. This was 16 percentile for age and sex matched control.  2. Normal coronary origin with right dominance.  3. Small focal calcified proximal LAD plaque, 0-24% stenosis.  4. Aortic atherosclerosis.  5. CAD-RADS 1. Minimal non-obstructive CAD (0-24%). Consider non-atherosclerotic causes of chest pain. Consider preventive therapy and risk factor modification.  Cardiogram 2021 1. Left ventricular ejection fraction, by estimation, is 60 to 65%. Left  ventricular ejection fraction by 3D volume is 63 %. The left ventricle has  normal function. The left ventricle has no regional wall motion  abnormalities. Left ventricular diastolic  parameters were normal.  The average left ventricular global longitudinal  strain is -23.5 %. The global longitudinal strain is normal.  2. Right ventricular systolic function is normal. The right ventricular  size is normal. There is normal pulmonary artery systolic pressure. The  estimated right ventricular systolic pressure is 56.2 mmHg.  3. The mitral valve is grossly normal. Trivial mitral valve  regurgitation. No evidence of mitral stenosis.  4. The aortic valve is tricuspid. There is mild calcification of the  aortic valve. Aortic valve regurgitation is not visualized. No aortic  stenosis is present.  5. The inferior vena cava is normal in size with greater than 50%  respiratory variability, suggesting right atrial pressure of 3 mmHg.   Conclusion(s)/Recommendation(s): Normal biventricular function without  evidence of hemodynamically significant valvular heart disease.  EKG:  EKG is not ordered today.    Recent Labs: 06/26/2020: Hemoglobin 10.8; Platelets 167; TSH 2.128 08/02/2020: BUN 15; Creatinine, Ser 0.95; Potassium 4.4; Sodium 140 11/25/2020: ALT 12  Recent Lipid Panel    Component Value Date/Time   CHOL 96 (L) 11/25/2020 0905   TRIG 51 11/25/2020 0905   HDL 44 11/25/2020 0905   CHOLHDL 2.2 11/25/2020 0905   LDLCALC 39 11/25/2020 0905     Risk Assessment/Calculations:       Physical Exam:    VS:  BP 110/70 (BP Location: Left Arm, Patient Position: Sitting, Cuff Size: Normal)   Pulse 66   Ht 5\' 7"  (1.702 m)   Wt 159 lb (72.1 kg)   LMP  (LMP Unknown)   SpO2 98%   BMI 24.90 kg/m     Wt Readings from Last 3 Encounters:  11/27/20 159 lb (72.1 kg)  09/17/20 156 lb 6.4 oz (70.9 kg)  09/09/20 148 lb 4.9 oz (67.3 kg)     GEN: Well nourished, well developed in no acute distress HEENT: Normal NECK: No JVD; No carotid bruits LYMPHATICS: No lymphadenopathy CARDIAC: RRR, no murmurs, rubs, gallops RESPIRATORY:  Clear to auscultation without rales, wheezing or rhonchi  ABDOMEN: Soft,  non-tender, non-distended MUSCULOSKELETAL:  No edema; No deformity  SKIN: Warm and dry NEUROLOGIC:  Alert and oriented x 3 PSYCHIATRIC:  Normal affect   ASSESSMENT:    1. Coronary artery calcification   2. LAFB (left anterior fascicular block)   3. RBBB   4. Rheumatoid arthritis, involving unspecified site, unspecified whether rheumatoid factor present ([place])  PLAN:    In order of problems listed above:  Coronary artery disease/calcified coronary artery plaque in the proximal LAD nonflow limiting 0 to 24% stenosis - At prior visit started aspirin 81 mg and Crestor 10 mg a day.  Her LDL was 39 this month.  Excellent.  We will continue current dosing.  No myalgias.  Hyperlipidemia - As above.  Excellent control with Crestor 10 mg.  Bifascicular block - No syncopal symptoms continue to monitor.  Rheumatoid arthritis - Continuing with methotrexate/hydroxychloroquine.  Blood work is being routinely monitored.  Ulnar deviation, rheumatoid joints noted in hands.  Essential hypertension - Excellent control currently.  We are going to refill her losartan 50 mg once a day.  Send it to [facility].  Follow up in 1 year   Medication Adjustments/Labs and Tests Ordered: Current medicines are reviewed at length with the patient today.  Concerns regarding medicines are outlined above.  No orders of the defined types were placed in this encounter.  Meds ordered this encounter  Medications  . losartan (COZAAR) 50 MG tablet    Sig: Take 1 tablet (50 mg total) by mouth daily.    Dispense:  90 tablet    Refill:  3    Patient Instructions  Medication Instructions:  The current medical regimen is effective;  continue present plan and medications.  *If you need a refill on your cardiac medications before your next appointment, please call your pharmacy*  Follow-Up: At [facility], you and your health needs are our priority.  As part of our continuing mission to provide you with  exceptional heart care, we have created designated Provider Care Teams.  These Care Teams include your primary Cardiologist (physician) and Advanced Practice Providers (APPs -  Physician Assistants and Nurse Practitioners) who all work together to provide you with the care you need, when you need it.  We recommend signing up for the patient portal called "MyChart".  Sign up information is provided on this After Visit Summary.  MyChart is used to connect with patients for Virtual Visits (Telemedicine).  Patients are able to view lab/test results, encounter notes, upcoming appointments, etc.  Non-urgent messages can be sent to your provider as well.   To learn more about what you can do with MyChart, go to [email or web address].    Your next appointment:   12 month(s)  The format for your next appointment:   In Person  Provider:   Candee Furbish, MD   Thank you for choosing [facility]!!          I,Alexis Bryant,acting as a scribe for Candee Furbish, MD.,have documented all relevant documentation on the behalf of Candee Furbish, MD,as directed by  Candee Furbish, MD while in the presence of Candee Furbish, MD.  I, Candee Furbish, MD, have reviewed all documentation for this visit. The documentation on 11/27/20 for the exam, diagnosis, procedures, and orders are all accurate and complete.  Signed, Candee Furbish, MD  11/27/2020 5:00 PM    [place]

## 2020-12-13 ENCOUNTER — Telehealth: Payer: Self-pay

## 2020-12-13 DIAGNOSIS — Z006 Encounter for examination for normal comparison and control in clinical research program: Secondary | ICD-10-CM

## 2020-12-13 NOTE — Telephone Encounter (Signed)
I called patient for her 90-day Identify Study follow up phone call. Patient is doing well with no cardiac symptoms at this time. I reminded patient I would call her in January for her 1 year follow-up.

## 2021-02-05 DIAGNOSIS — Z0000 Encounter for general adult medical examination without abnormal findings: Secondary | ICD-10-CM | POA: Insufficient documentation

## 2021-03-25 ENCOUNTER — Ambulatory Visit: Payer: Medicare HMO | Admitting: Orthopedic Surgery

## 2021-03-25 ENCOUNTER — Ambulatory Visit: Payer: Self-pay

## 2021-03-25 ENCOUNTER — Other Ambulatory Visit: Payer: Self-pay

## 2021-03-25 ENCOUNTER — Ambulatory Visit (INDEPENDENT_AMBULATORY_CARE_PROVIDER_SITE_OTHER): Payer: Medicare HMO

## 2021-03-25 ENCOUNTER — Encounter: Payer: Self-pay | Admitting: Orthopedic Surgery

## 2021-03-25 DIAGNOSIS — M79672 Pain in left foot: Secondary | ICD-10-CM | POA: Diagnosis not present

## 2021-03-25 DIAGNOSIS — M79671 Pain in right foot: Secondary | ICD-10-CM | POA: Diagnosis not present

## 2021-03-25 DIAGNOSIS — I872 Venous insufficiency (chronic) (peripheral): Secondary | ICD-10-CM

## 2021-03-25 DIAGNOSIS — M069 Rheumatoid arthritis, unspecified: Secondary | ICD-10-CM | POA: Diagnosis not present

## 2021-03-25 NOTE — Progress Notes (Signed)
Office Visit Note   Patient: Bethany Grant           Date of Birth: 29-Jul-1940           MRN: [id number] Visit Date: 03/25/2021              Requested by: Kirk Ruths, MD [place] [facility] [place],  [place] [address] PCP: Kirk Ruths, MD  Chief Complaint  Patient presents with   Right Foot - Pain   Left Foot - Pain      HPI: Patient is a 80 year old woman with a history of rheumatoid arthritis she has undergone forefoot surgery to unload the metatarsal heads however patient has had progressive collapse across the metatarsal heads and has developed a blood blister beneath the second metatarsal head right foot.  Patient is status post surgery of the right foot in 2020.  Assessment & Plan: Visit Diagnoses:  1. Pain in left foot   2. Pain in right foot   3. Rheumatoid arthritis involving both feet, unspecified whether rheumatoid factor present ([place])     Plan: The blood from the metatarsal head pressure.  Was debrided this did feel better after debridement for ambulation.  Recommended a Dr. Felicie Morn donut to place to protect the pressure from the ulcer.  Recommended sole orthotics with a metatarsal pad and stiff soled [place]oka sneakers.  Reevaluate in 3 weeks.  Recommended a size medium knee-high compression stocking.  Follow-Up Instructions: Return in about 3 weeks (around 04/15/2021).   Ortho Exam  Patient is alert, oriented, no adenopathy, well-dressed, normal affect, normal respiratory effort. Examination patient has good pulses bilaterally she has varus alignment of the lesser toes on the left foot valgus alignment lesser toes on the right foot.  Patient has dorsiflexion to neutral bilaterally.  Beneath the right second metatarsal head there is a blood blister secondary to metatarsal head pressure.  After informed consent a 10 blade knife was used to unroofed the blister there was good healthy granulation tissue at the base the wound is 5  mm in diameter 1 mm deep.  This was touched with silver nitrate and a Band-Aid was applied.  There is no redness no cellulitis no exposed bone or tendon.  Patient has venous insufficiency worse on the right than the left with varicose veins.  She has pain from the swelling her calf measures 35 cm in circumference and recommended a medium knee-high compression stocking.  Imaging: XR Foot 2 Views Left  Result Date: 03/25/2021 2 view radiographs of the left foot shows collapse of the metatarsal heads 2 and 3 status post Weil osteotomy of the fourth metatarsal.  XR Foot 2 Views Right  Result Date: 03/25/2021 2 view radiographs of the right foot shows previous Weil osteotomies of the second third and fourth metatarsals she has had interval collapse of the metatarsal heads with and valgus alignment of the second third and fourth toes.  No images are attached to the encounter.  Labs: Lab Results  Component Value Date   REPTSTATUS 06/28/2020 FINAL 06/26/2020   GRAMSTAIN  05/07/2008    RARE WBC PRESENT, PREDOMINANTLY MONONUCLEAR NO ORGANISMS SEEN NO ORGANISMS SEEN   CULT  06/26/2020    NO GROWTH Performed at [facility], [place] [address] [place],  [address]    LABORGA No Herpes Simplex Virus detected. 03/25/2016     Lab Results  Component Value Date   ALBUMIN 3.2 (L) 06/26/2020  No results found for: MG Lab Results  Component Value Date   VD25OH 46 07/01/2015   VD25OH 36 06/12/2013    No results found for: PREALBUMIN CBC EXTENDED Latest Ref Rng & Units 06/26/2020 08/08/2018 04/21/2018  WBC 4.0 - 10.5 K/uL 5.4 4.5 4.3  RBC 3.87 - 5.11 MIL/uL 3.54(L) 4.14 4.11  HGB 12.0 - 15.0 g/dL 10.8(L) 12.7 12.9  HCT 36.0 - 46.0 % 32.8(L) 39.7 38.3  PLT 150 - 400 K/uL 167 163 130.0(L)  NEUTROABS 1.7 - 7.7 K/uL 4.7 - 2.5  LYMPHSABS 0.7 - 4.0 K/uL 0.1(L) - 0.9     There is no height or weight on file to calculate BMI.  Orders:  Orders Placed This Encounter  Procedures   XR  Foot 2 Views Left   XR Foot 2 Views Right   No orders of the defined types were placed in this encounter.    Procedures: No procedures performed  Clinical Data: No additional findings.  ROS:  All other systems negative, except as noted in the HPI. Review of Systems  Objective: Vital Signs: LMP  (LMP Unknown)   Specialty Comments:  No specialty comments available.  PMFS History: Patient Active Problem List   Diagnosis Date Noted   Claw toe, acquired, right    Cough 04/21/2018   Bronchitis 04/21/2018   Abnormal EKG 12/20/2014   Other fatigue 12/20/2014   RBBB 12/20/2013   LAFB (left anterior fascicular block) 12/20/2013   Bifascicular block 12/20/2013   RA (rheumatoid arthritis) ([place]) 12/20/2013   Rheumatoid lung disease ([place]) 10/12/2013   Osteopenia 06/14/2013   Hypertension    RHINITIS 02/25/2009   ARTHRITIS, RHEUMATOID 05/03/2008   Past Medical History:  Diagnosis Date   Arthritis    Hypercholesterolemia    Hypertension    Osteopenia 09/2017   T score -2.1 FRAX 18% / 2.7%   PONV (postoperative nausea and vomiting)    Rheumatoid arthritis(714.0)     Family History  Problem Relation Age of Onset   Cancer Mother        LUNG   Hypertension Sister    Cancer Sister        LUNG, THYROID, UTERINE   Cancer Brother        LUNG   Cancer Sister        Lung cancer   Colon cancer Neg Hx     Past Surgical History:  Procedure Laterality Date   ABDOMINAL HYSTERECTOMY  1991   TAH/BSO   BARTHOLIN GLAND CYST EXCISION  1980   RIGHT   BREAST SURGERY  1988   BREAST CYST   COLONOSCOPY     COLONOSCOPY N/A 08/24/2013   Procedure: COLONOSCOPY;  Surgeon: Rogene Houston, MD;  [facility];  Service: Endoscopy;  Laterality: N/A;  100   COLONOSCOPY WITH PROPOFOL N/A 09/09/2020   Procedure: COLONOSCOPY WITH PROPOFOL;  Surgeon: Toledo, Benay Pike, MD;  Location: [facility];  Service: Gastroenterology;  Laterality: N/A;   ESOPHAGOGASTRODUODENOSCOPY (EGD) WITH  PROPOFOL N/A 09/09/2020   Procedure: ESOPHAGOGASTRODUODENOSCOPY (EGD) WITH PROPOFOL;  Surgeon: Toledo, Benay Pike, MD;  Location: [facility];  Service: Gastroenterology;  Laterality: N/A;   FOOT SURGERY     2 TIMES   FOOT SURGERY Left    HAND SURGERY     HEMORRHOID SURGERY     been over 20 years ago   OLECRANON BURSECTOMY Right 11/29/2013   Procedure: EXCISION OLECRANON BURSA RIGHT ;  Surgeon: Wynonia Sours, MD;  Location: [place];  Service: Orthopedics;  Laterality: Right;   OOPHORECTOMY     BSO   TENOSYNOVECTOMY Right 11/29/2013   Procedure: TENOSYNOVECTOMY FLEXOR TENDON RIGHT WRIST ;  Surgeon: Wynonia Sours, MD;  Location: [place];  Service: Orthopedics;  Laterality: Right;   WEIL OSTEOTOMY Right 08/10/2018   Procedure: WEIL OSTEOTOMY 2, 3,4 METATARSAL RIGHT FOOT, PROXIMAL INTERPHALANGEAL RESECTION TOES 2, 3, 4;  Surgeon: Newt Minion, MD;  Location: [place];  Service: Orthopedics;  Laterality: Right;   Social History   Occupational History   Not on file  Tobacco Use   Smoking status: Former    Packs/day: 0.80    Years: 30.00    Pack years: 24.00    Types: Cigarettes    Quit date: 07/27/2004    Years since quitting: 16.6   Smokeless tobacco: Never  Vaping Use   Vaping Use: Never used  Substance and Sexual Activity   Alcohol use: No    Alcohol/week: 0.0 standard drinks   Drug use: No   Sexual activity: Not Currently    Birth control/protection: Surgical    Comment: 1st intercourse 80 yo-Fewer than 5 partners

## 2021-04-15 ENCOUNTER — Ambulatory Visit: Payer: Medicare HMO | Admitting: Orthopedic Surgery

## 2021-04-15 ENCOUNTER — Other Ambulatory Visit: Payer: Self-pay

## 2021-04-15 DIAGNOSIS — I872 Venous insufficiency (chronic) (peripheral): Secondary | ICD-10-CM | POA: Diagnosis not present

## 2021-04-15 DIAGNOSIS — M79671 Pain in right foot: Secondary | ICD-10-CM

## 2021-04-15 DIAGNOSIS — M79672 Pain in left foot: Secondary | ICD-10-CM | POA: Diagnosis not present

## 2021-04-16 ENCOUNTER — Encounter: Payer: Self-pay | Admitting: Orthopedic Surgery

## 2021-04-16 NOTE — Progress Notes (Signed)
Office Visit Note   Patient: Bethany Grant           Date of Birth: 1941/04/26           MRN: [id number] Visit Date: 04/15/2021              Requested by: Kirk Ruths, MD [place],  [place] [address] PCP: Kirk Ruths, MD  No chief complaint on file.     HPI: Patient is a 80 year old woman who presents in follow-up for pain in both feet.  She states that she is more tender beneath the ball of the right foot than the left foot but feels much better than she did with her last examination.  She is currently wearing a new pair of Hoka sneakers.  Assessment & Plan: Visit Diagnoses:  1. Pain in left foot   2. Pain in right foot   3. Venous insufficiency of right leg     Plan: Continue with the Hoka sneakers continue with Achilles stretching.  Follow-Up Instructions: Return if symptoms worsen or fail to improve.   Ortho Exam  Patient is alert, oriented, no adenopathy, well-dressed, normal affect, normal respiratory effort. Patient has good pulses.  She has a flat Wagner grade 1 ulcer 3 mm in diameter beneath the third metatarsal head there is no redness no cellulitis no signs of infection.  She has a subungual hematoma from her old shoewear beneath the nail of the great toe and second toe.  Patient orthotic is larger than her foot with an excellent shoe fit.  Imaging: No results found. No images are attached to the encounter.  Labs: Lab Results  Component Value Date   REPTSTATUS 06/28/2020 FINAL 06/26/2020   GRAMSTAIN  05/07/2008    RARE WBC PRESENT, PREDOMINANTLY MONONUCLEAR NO ORGANISMS SEEN NO ORGANISMS SEEN   CULT  06/26/2020    NO GROWTH Performed at [facility], [place] [address], [place], [place] [address]    LABORGA No Herpes Simplex Virus detected. 03/25/2016     Lab Results  Component Value Date   ALBUMIN 3.2 (L) 06/26/2020    No results found for: MG Lab Results  Component Value Date    VD25OH 46 07/01/2015   VD25OH 36 06/12/2013    No results found for: PREALBUMIN CBC EXTENDED Latest Ref Rng & Units 06/26/2020 08/08/2018 04/21/2018  WBC 4.0 - 10.5 K/uL 5.4 4.5 4.3  RBC 3.87 - 5.11 MIL/uL 3.54(L) 4.14 4.11  HGB 12.0 - 15.0 g/dL 10.8(L) 12.7 12.9  HCT 36.0 - 46.0 % 32.8(L) 39.7 38.3  PLT 150 - 400 K/uL 167 163 130.0(L)  NEUTROABS 1.7 - 7.7 K/uL 4.7 - 2.5  LYMPHSABS 0.7 - 4.0 K/uL 0.1(L) - 0.9     There is no height or weight on file to calculate BMI.  Orders:  No orders of the defined types were placed in this encounter.  No orders of the defined types were placed in this encounter.    Procedures: No procedures performed  Clinical Data: No additional findings.  ROS:  All other systems negative, except as noted in the HPI. Review of Systems  Objective: Vital Signs: LMP  (LMP Unknown)   Specialty Comments:  No specialty comments available.  PMFS History: Patient Active Problem List   Diagnosis Date Noted   Claw toe, acquired, right    Cough 04/21/2018   Bronchitis 04/21/2018   Abnormal EKG 12/20/2014   Other fatigue 12/20/2014  RBBB 12/20/2013   LAFB (left anterior fascicular block) 12/20/2013   Bifascicular block 12/20/2013   RA (rheumatoid arthritis) ([place]) 12/20/2013   Rheumatoid lung disease ([place]) 10/12/2013   Osteopenia 06/14/2013   Hypertension    RHINITIS 02/25/2009   ARTHRITIS, RHEUMATOID 05/03/2008   Past Medical History:  Diagnosis Date   Arthritis    Hypercholesterolemia    Hypertension    Osteopenia 09/2017   T score -2.1 FRAX 18% / 2.7%   PONV (postoperative nausea and vomiting)    Rheumatoid arthritis(714.0)     Family History  Problem Relation Age of Onset   Cancer Mother        LUNG   Hypertension Sister    Cancer Sister        LUNG, THYROID, UTERINE   Cancer Brother        LUNG   Cancer Sister        Lung cancer   Colon cancer Neg Hx     Past Surgical History:  Procedure Laterality Date   ABDOMINAL  HYSTERECTOMY  1991   TAH/BSO   BARTHOLIN GLAND CYST EXCISION  1980   RIGHT   BREAST SURGERY  1988   BREAST CYST   COLONOSCOPY     COLONOSCOPY N/A 08/24/2013   Procedure: COLONOSCOPY;  Surgeon: Rogene Houston, MD;  [facility];  Service: Endoscopy;  Laterality: N/A;  100   COLONOSCOPY WITH PROPOFOL N/A 09/09/2020   Procedure: COLONOSCOPY WITH PROPOFOL;  Surgeon: Toledo, Benay Pike, MD;  Location: [facility];  Service: Gastroenterology;  Laterality: N/A;   ESOPHAGOGASTRODUODENOSCOPY (EGD) WITH PROPOFOL N/A 09/09/2020   Procedure: ESOPHAGOGASTRODUODENOSCOPY (EGD) WITH PROPOFOL;  Surgeon: Toledo, Benay Pike, MD;  Location: [facility];  Service: Gastroenterology;  Laterality: N/A;   FOOT SURGERY     2 TIMES   FOOT SURGERY Left    HAND SURGERY     HEMORRHOID SURGERY     been over 20 years ago   OLECRANON BURSECTOMY Right 11/29/2013   Procedure: EXCISION OLECRANON BURSA RIGHT ;  Surgeon: Wynonia Sours, MD;  Location: [place];  Service: Orthopedics;  Laterality: Right;   OOPHORECTOMY     BSO   TENOSYNOVECTOMY Right 11/29/2013   Procedure: TENOSYNOVECTOMY FLEXOR TENDON RIGHT WRIST ;  Surgeon: Wynonia Sours, MD;  Location: [place];  Service: Orthopedics;  Laterality: Right;   WEIL OSTEOTOMY Right 08/10/2018   Procedure: WEIL OSTEOTOMY 2, 3,4 METATARSAL RIGHT FOOT, PROXIMAL INTERPHALANGEAL RESECTION TOES 2, 3, 4;  Surgeon: Newt Minion, MD;  Location: [place];  Service: Orthopedics;  Laterality: Right;   Social History   Occupational History   Not on file  Tobacco Use   Smoking status: Former    Packs/day: 0.80    Years: 30.00    Pack years: 24.00    Types: Cigarettes    Quit date: 07/27/2004    Years since quitting: 16.7   Smokeless tobacco: Never  Vaping Use   Vaping Use: Never used  Substance and Sexual Activity   Alcohol use: No    Alcohol/week: 0.0 standard drinks   Drug use: No   Sexual activity: Not Currently    Birth  control/protection: Surgical    Comment: 1st intercourse 80 yo-Fewer than 5 partners

## 2021-04-23 ENCOUNTER — Other Ambulatory Visit
Admission: RE | Admit: 2021-04-23 | Discharge: 2021-04-23 | Disposition: A | Discharge status: Home / Self Care | Payer: Medicare HMO | Source: Ambulatory Visit | Attending: Internal Medicine | Admitting: Internal Medicine

## 2021-04-23 DIAGNOSIS — R071 Chest pain on breathing: Secondary | ICD-10-CM | POA: Diagnosis present

## 2021-04-23 LAB — D-DIMER, QUANTITATIVE: D-Dimer, Quant: 2.41 ug/mL-FEU — ABNORMAL HIGH (ref 0.00–0.50)

## 2021-04-23 LAB — TROPONIN I (HIGH SENSITIVITY): Troponin I (High Sensitivity): 5 ng/L (ref <18)

## 2021-04-24 ENCOUNTER — Other Ambulatory Visit: Payer: Self-pay

## 2021-04-24 ENCOUNTER — Other Ambulatory Visit: Payer: Self-pay | Admitting: Internal Medicine

## 2021-04-24 ENCOUNTER — Ambulatory Visit
Admission: RE | Admit: 2021-04-24 | Discharge: 2021-04-24 | Disposition: A | Discharge status: Home / Self Care | Payer: Medicare HMO | Source: Ambulatory Visit | Attending: Internal Medicine | Admitting: Internal Medicine

## 2021-04-24 DIAGNOSIS — R079 Chest pain, unspecified: Secondary | ICD-10-CM | POA: Diagnosis present

## 2021-04-24 DIAGNOSIS — R7989 Other specified abnormal findings of blood chemistry: Secondary | ICD-10-CM | POA: Insufficient documentation

## 2021-04-24 LAB — POCT I-STAT CREATININE: Creatinine, Ser: 1 mg/dL (ref 0.44–1.00)

## 2021-04-24 MED ORDER — IOHEXOL 350 MG/ML SOLN
75.0000 mL | Freq: Once | INTRAVENOUS | Status: AC | PRN
  Administered 2021-04-24: 75 mL via INTRAVENOUS

## 2021-04-29 ENCOUNTER — Telehealth: Payer: Self-pay | Admitting: Cardiology

## 2021-04-29 NOTE — Telephone Encounter (Signed)
Coronary CT results from 07/2020: CT does show a small proximal LAD calcified plaque that is nonflow limiting.  Aortic atherosclerosis is also noted.  Please start aspirin 81 mg. Also recommend Crestor 20 mg once a day to help stabilize plaque.   Check lipid panel and ALT in 3 months.   In addition, there was pulmonary finding of nonspecific groundglass opacities in the lung bases.  Considerations include mild or resolving COVID-pneumonia.  Please refer her to pulmonary for further evaluation.   Bethany Furbish, MD   Documentation from [facility] 04/23/2021:  Bethany Grant complains of left lower cp when breathing since getting a covid vaccine and shingrix 1.5 weeks ago. Also doe, not doing much but cp doesn't seem worse after exertion. No edema. No cough or other uri symptoms   Pt did have EKG and lab at [facility] 04/23/2021 - results in [place] (listed on office visit note from that day)   Will have Dr Marlou Porch to review for further instructions/orders.

## 2021-04-29 NOTE — Telephone Encounter (Signed)
Chest pain thankfully is not coming from heart.  Let's have her see pulmonary if not already established.   Thanks  Candee Furbish, MD

## 2021-04-29 NOTE — Telephone Encounter (Signed)
Had lm for Gregary Signs from [facility] to call back to discuss. Received call back and advised per Dr Marlou Porch, pt needs referral to pulmonary for further evaluation.  She has seen Dr Halford Chessman in the past. Coronary CT result and recommendation forwarded to Dr Frazier Richards.

## 2021-04-29 NOTE — Telephone Encounter (Signed)
[facility] is wanting to have this pt scheduled as soon as possible for a stress test based on lab results and pts symptoms.. ct was negative... please advise.

## 2021-05-01 IMAGING — DX DG CHEST 2V
2 series · 2 of 2 positions shown · non-contrast
Comparison: Radiograph 06/26/2020. Included portions from cardiac
CT 08/05/2020

CLINICAL DATA: Rheumatoid lung disease.

EXAM:
CHEST - 2 VIEW

[chest pa]
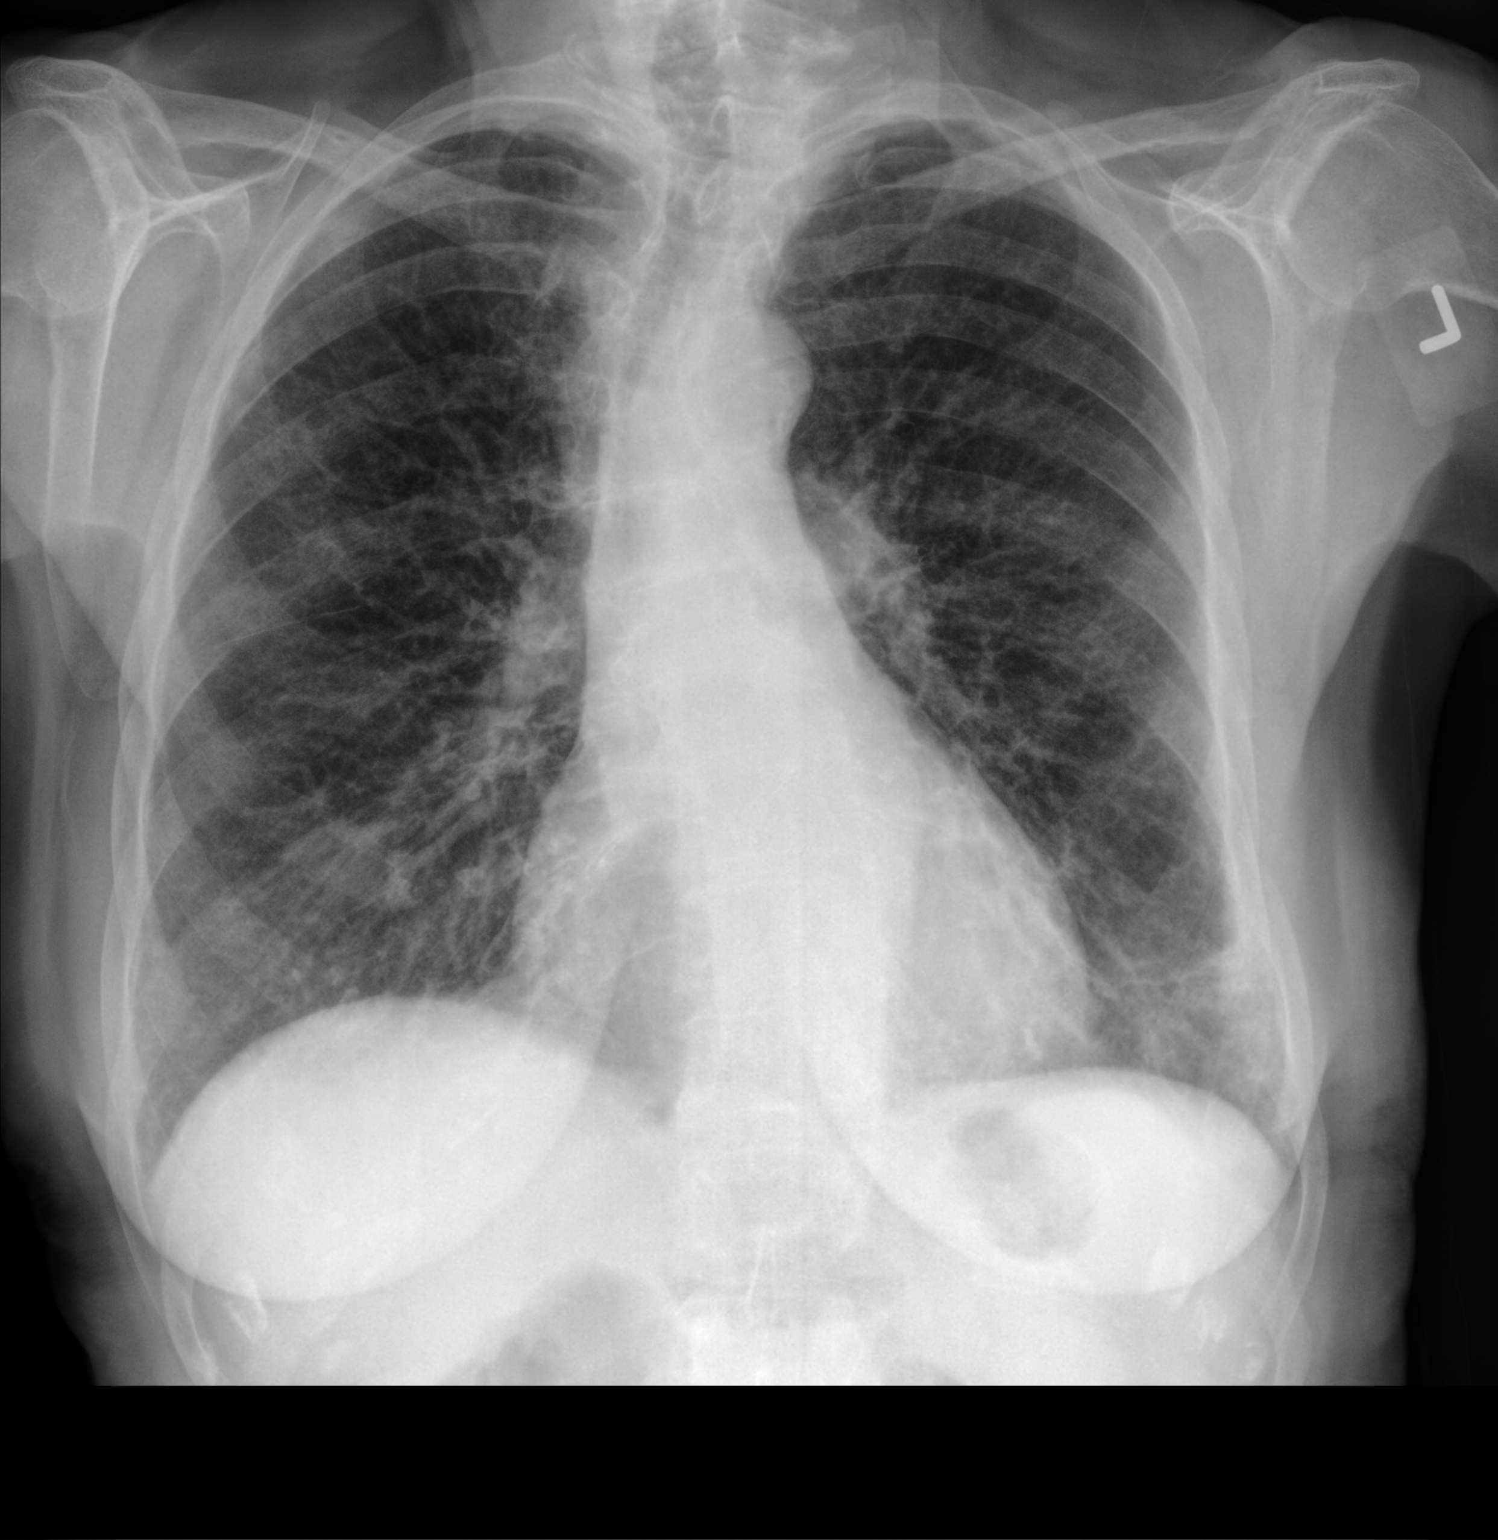

[chest lat]
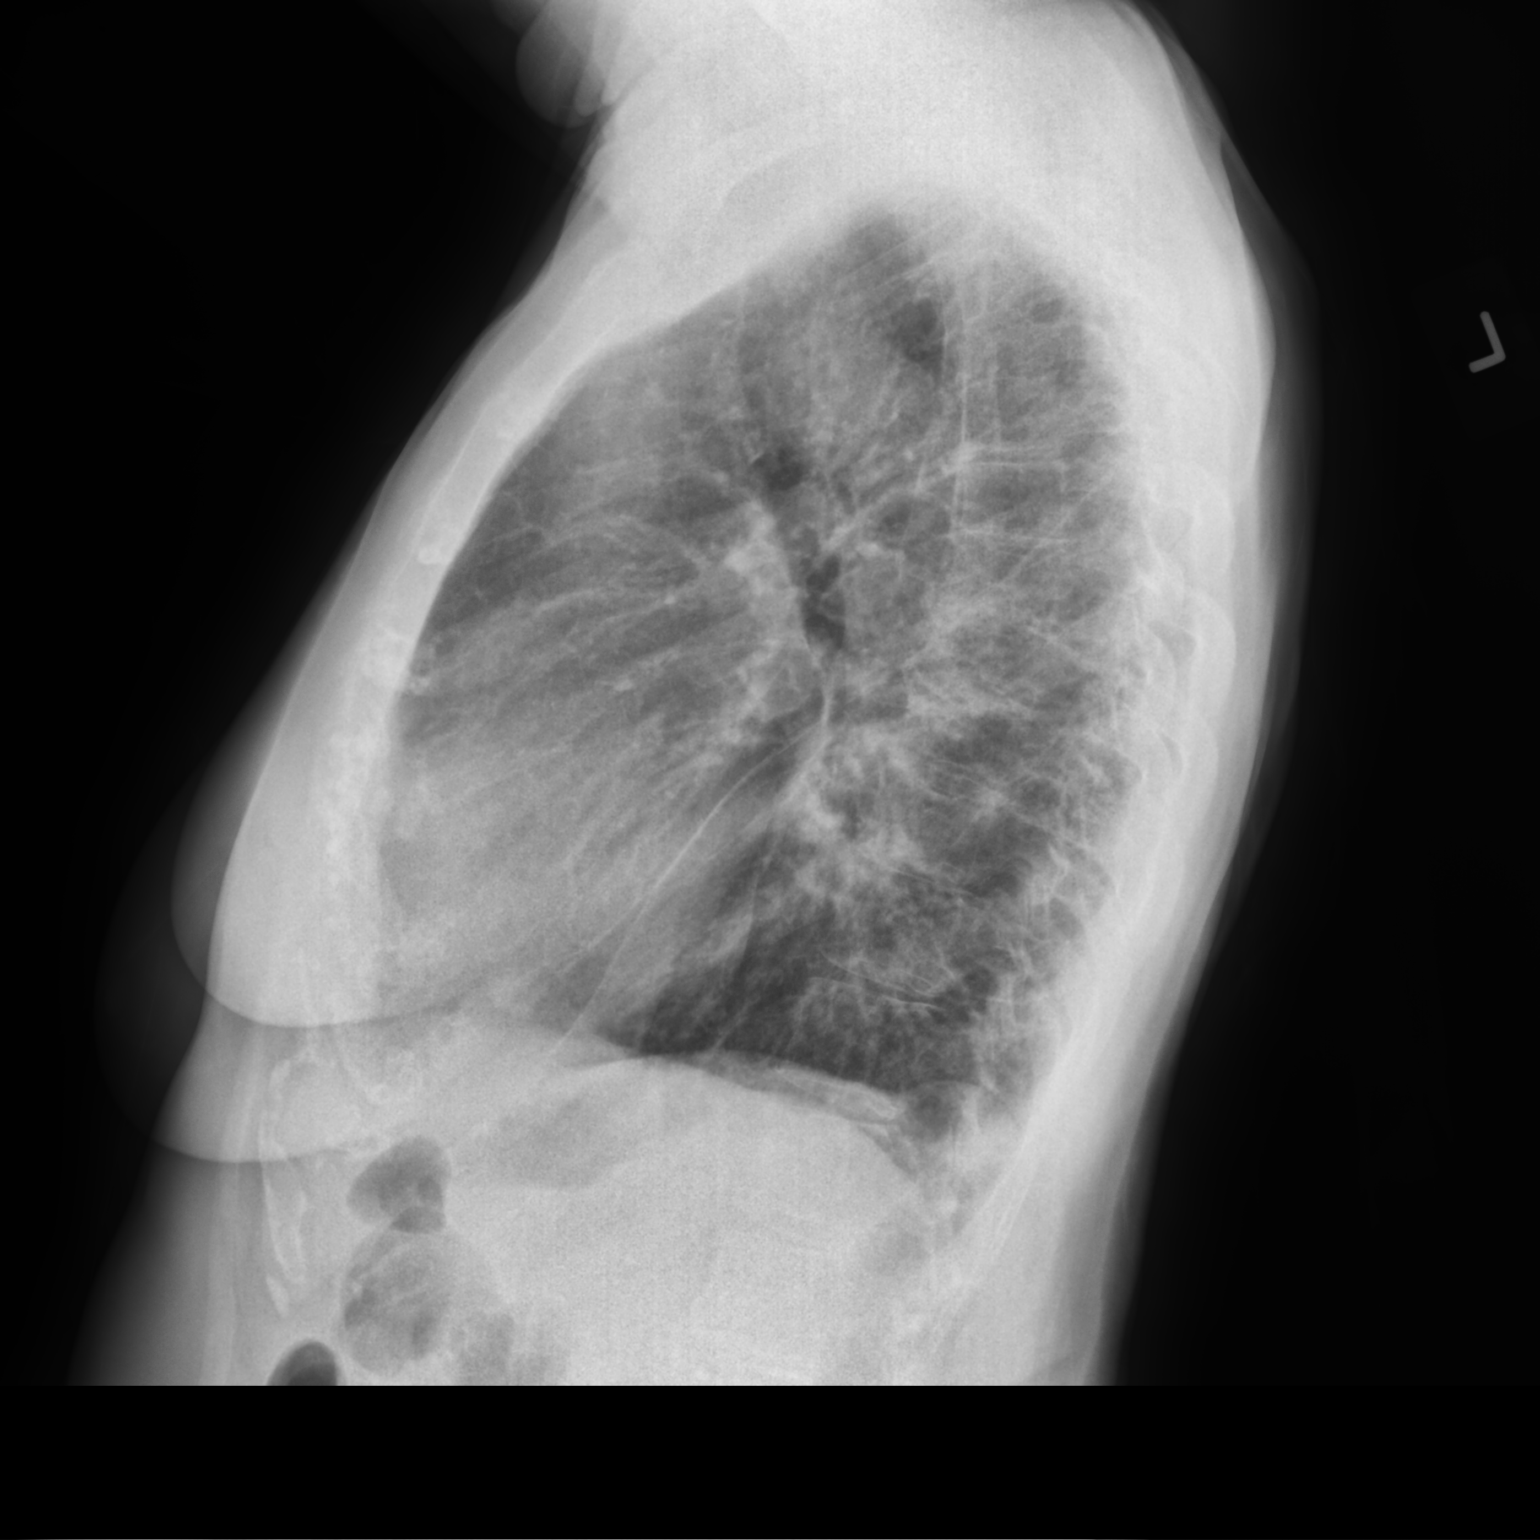

[2 of 2 positions shown; findings below may reference images not displayed]

FINDINGS: The heart is normal in size. Normal mediastinal contours. Aortic
atherosclerosis. There is background interstitial thickening. Areas
of ill defined opacity in the subpleural lung bases and right mid
lung zone, new. Biapical pleuroparenchymal scarring. No pleural
fluid or pneumothorax. No acute osseous abnormalities are seen.
IMPRESSION: 1. Diffuse interstitial thickening which appears chronic.
2. Ill-defined areas of opacity in the subpleural lung bases and
right mid lung zone. When correlating with cardiac CT last month,
this corresponds to ground-glass opacity and subpleural
reticulation. Differential considerations include areas of
interstitial lung disease or superimposed infection, including
atypical viral etiologies (COVID 19).

## 2021-05-06 ENCOUNTER — Inpatient Hospital Stay: Payer: Medicare HMO

## 2021-05-06 ENCOUNTER — Inpatient Hospital Stay: Payer: Medicare HMO | Attending: Oncology | Admitting: Oncology

## 2021-05-06 ENCOUNTER — Encounter (INDEPENDENT_AMBULATORY_CARE_PROVIDER_SITE_OTHER): Payer: Self-pay

## 2021-05-06 ENCOUNTER — Encounter: Payer: Self-pay | Admitting: Oncology

## 2021-05-06 VITALS — BP 124/55 | HR 67 | Temp 98.1°F | Resp 17 | Wt 155.0 lb

## 2021-05-06 DIAGNOSIS — D72810 Lymphocytopenia: Secondary | ICD-10-CM | POA: Insufficient documentation

## 2021-05-06 DIAGNOSIS — Z801 Family history of malignant neoplasm of trachea, bronchus and lung: Secondary | ICD-10-CM | POA: Insufficient documentation

## 2021-05-06 DIAGNOSIS — D72819 Decreased white blood cell count, unspecified: Secondary | ICD-10-CM | POA: Diagnosis not present

## 2021-05-06 DIAGNOSIS — M069 Rheumatoid arthritis, unspecified: Secondary | ICD-10-CM | POA: Diagnosis not present

## 2021-05-06 DIAGNOSIS — Z87891 Personal history of nicotine dependence: Secondary | ICD-10-CM | POA: Diagnosis not present

## 2021-05-06 DIAGNOSIS — D696 Thrombocytopenia, unspecified: Secondary | ICD-10-CM | POA: Insufficient documentation

## 2021-05-06 DIAGNOSIS — D649 Anemia, unspecified: Secondary | ICD-10-CM | POA: Diagnosis not present

## 2021-05-06 DIAGNOSIS — T451X5A Adverse effect of antineoplastic and immunosuppressive drugs, initial encounter: Secondary | ICD-10-CM | POA: Diagnosis not present

## 2021-05-06 DIAGNOSIS — R5383 Other fatigue: Secondary | ICD-10-CM | POA: Diagnosis not present

## 2021-05-06 DIAGNOSIS — N189 Chronic kidney disease, unspecified: Secondary | ICD-10-CM | POA: Diagnosis not present

## 2021-05-06 DIAGNOSIS — Z791 Long term (current) use of non-steroidal anti-inflammatories (NSAID): Secondary | ICD-10-CM | POA: Diagnosis not present

## 2021-05-06 LAB — CBC WITH DIFFERENTIAL/PLATELET
Abs Immature Granulocytes: 0.01 10*3/uL (ref 0.00–0.07)
Basophils Absolute: 0 10*3/uL (ref 0.0–0.1)
Basophils Relative: 0 %
Eosinophils Absolute: 0.2 10*3/uL (ref 0.0–0.5)
Eosinophils Relative: 6 %
HCT: 34.2 % — ABNORMAL LOW (ref 36.0–46.0)
Hemoglobin: 10.5 g/dL — ABNORMAL LOW (ref 12.0–15.0)
Immature Granulocytes: 0 %
Lymphocytes Relative: 17 %
Lymphs Abs: 0.5 10*3/uL — ABNORMAL LOW (ref 0.7–4.0)
MCH: 31 pg (ref 26.0–34.0)
MCHC: 30.7 g/dL (ref 30.0–36.0)
MCV: 100.9 fL — ABNORMAL HIGH (ref 80.0–100.0)
Monocytes Absolute: 0.2 10*3/uL (ref 0.1–1.0)
Monocytes Relative: 8 %
Neutro Abs: 2.1 10*3/uL (ref 1.7–7.7)
Neutrophils Relative %: 69 %
Platelets: 127 10*3/uL — ABNORMAL LOW (ref 150–400)
RBC: 3.39 MIL/uL — ABNORMAL LOW (ref 3.87–5.11)
RDW: 15.9 % — ABNORMAL HIGH (ref 11.5–15.5)
WBC: 3.1 10*3/uL — ABNORMAL LOW (ref 4.0–10.5)
nRBC: 0 % (ref 0.0–0.2)

## 2021-05-06 LAB — TECHNOLOGIST SMEAR REVIEW: Plt Morphology: NORMAL

## 2021-05-06 LAB — COMPREHENSIVE METABOLIC PANEL
ALT: 14 U/L (ref 0–44)
AST: 24 U/L (ref 15–41)
Albumin: 3.2 g/dL — ABNORMAL LOW (ref 3.5–5.0)
Alkaline Phosphatase: 94 U/L (ref 38–126)
Anion gap: 6 (ref 5–15)
BUN: 13 mg/dL (ref 8–23)
CO2: 28 mmol/L (ref 22–32)
Calcium: 8 mg/dL — ABNORMAL LOW (ref 8.9–10.3)
Chloride: 101 mmol/L (ref 98–111)
Creatinine, Ser: 1.26 mg/dL — ABNORMAL HIGH (ref 0.44–1.00)
GFR, Estimated: 44 mL/min — ABNORMAL LOW (ref >60)
Glucose, Bld: 123 mg/dL — ABNORMAL HIGH (ref 70–99)
Potassium: 4.4 mmol/L (ref 3.5–5.1)
Sodium: 135 mmol/L (ref 135–145)
Total Bilirubin: 0.4 mg/dL (ref 0.3–1.2)
Total Protein: 5.9 g/dL — ABNORMAL LOW (ref 6.5–8.1)

## 2021-05-06 LAB — VITAMIN B12: Vitamin B-12: 230 pg/mL (ref 180–914)

## 2021-05-06 LAB — HIV ANTIBODY (ROUTINE TESTING W REFLEX): HIV Screen 4th Generation wRfx: NONREACTIVE

## 2021-05-06 LAB — FOLATE: Folate: >100 ng/mL (ref >5.9)

## 2021-05-06 NOTE — Progress Notes (Signed)
Hematology/Oncology Consult note [facility] [phone] [phone]   Patient Care Team: Kirk Ruths, MD as PCP - General (Internal Medicine) Jerline Pain, MD as PCP - Cardiology (Cardiology)  REFERRING PROVIDER: Kirk Ruths, MD  CHIEF COMPLAINTS/REASON FOR VISIT:  Evaluation of leukopenia  HISTORY OF PRESENTING ILLNESS:   Bethany Grant is a  80 y.o.  female with PMH listed below was seen in consultation at the request of  Kirk Ruths, MD  for evaluation of leukopenia  Patient has rheumatoid arthritis and has been on methotrexate until recently, her CBC shows leukopenia and she was advised by rheumatologist to come off methotrexate.  Currently she takes NSAIDs as needed for pain. Patient denies any unintentional weight loss, fever, night sweats.  She was accompanied by her husband. Patient reports feeling fatigued, shortness of breath, insomnia, and since she came off methotrexate, she has felt much better.   Review of Systems  Constitutional:  Positive for fatigue. Negative for appetite change, chills and fever.  HENT:   Negative for hearing loss and voice change.   Eyes:  Negative for eye problems.  Respiratory:  Positive for shortness of breath. Negative for chest tightness and cough.   Cardiovascular:  Negative for chest pain.  Gastrointestinal:  Negative for abdominal distention, abdominal pain and blood in stool.  Endocrine: Negative for hot flashes.  Genitourinary:  Negative for difficulty urinating and frequency.   Musculoskeletal:  Positive for arthralgias.  Skin:  Negative for itching and rash.  Neurological:  Negative for extremity weakness.  Hematological:  Negative for adenopathy.  Psychiatric/Behavioral:  Positive for sleep disturbance. Negative for confusion.    MEDICAL HISTORY:  Past Medical History:  Diagnosis Date   Arthritis    Hypercholesterolemia    Hypertension    Osteopenia  09/2017   T score -2.1 FRAX 18% / 2.7%   PONV (postoperative nausea and vomiting)    Rheumatoid arthritis(714.0)     SURGICAL HISTORY: Past Surgical History:  Procedure Laterality Date   ABDOMINAL HYSTERECTOMY  1991   TAH/BSO   BARTHOLIN GLAND CYST EXCISION  1980   RIGHT   BREAST SURGERY  1988   BREAST CYST   COLONOSCOPY     COLONOSCOPY N/A 08/24/2013   Procedure: COLONOSCOPY;  Surgeon: Rogene Houston, MD;  [facility];  Service: Endoscopy;  Laterality: N/A;  100   COLONOSCOPY WITH PROPOFOL N/A 09/09/2020   Procedure: COLONOSCOPY WITH PROPOFOL;  Surgeon: Toledo, Benay Pike, MD;  Location: [facility];  Service: Gastroenterology;  Laterality: N/A;   ESOPHAGOGASTRODUODENOSCOPY (EGD) WITH PROPOFOL N/A 09/09/2020   Procedure: ESOPHAGOGASTRODUODENOSCOPY (EGD) WITH PROPOFOL;  Surgeon: Toledo, Benay Pike, MD;  Location: [facility];  Service: Gastroenterology;  Laterality: N/A;   FOOT SURGERY     2 TIMES   FOOT SURGERY Left    HAND SURGERY     HEMORRHOID SURGERY     been over 20 years ago   OLECRANON BURSECTOMY Right 11/29/2013   Procedure: EXCISION OLECRANON BURSA RIGHT ;  Surgeon: Wynonia Sours, MD;  Location: [place];  Service: Orthopedics;  Laterality: Right;   OOPHORECTOMY     BSO   TENOSYNOVECTOMY Right 11/29/2013   Procedure: TENOSYNOVECTOMY FLEXOR TENDON RIGHT WRIST ;  Surgeon: Wynonia Sours, MD;  Location: [place];  Service: Orthopedics;  Laterality: Right;   WEIL OSTEOTOMY Right 08/10/2018   Procedure: WEIL OSTEOTOMY 2, 3,4 METATARSAL RIGHT FOOT, PROXIMAL INTERPHALANGEAL RESECTION TOES 2, 3,  4;  Surgeon: Newt Minion, MD;  Location: [place];  Service: Orthopedics;  Laterality: Right;    SOCIAL HISTORY: Social History   Socioeconomic History   Marital status: Married    Spouse name: Not on file   Number of children: Not on file   Years of education: Not on file   Highest education level: Not on file  Occupational History    Not on file  Tobacco Use   Smoking status: Former    Packs/day: 0.80    Years: 30.00    Pack years: 24.00    Types: Cigarettes    Quit date: 07/27/2004    Years since quitting: 16.7   Smokeless tobacco: Never  Vaping Use   Vaping Use: Never used  Substance and Sexual Activity   Alcohol use: No    Alcohol/week: 0.0 standard drinks   Drug use: No   Sexual activity: Not Currently    Birth control/protection: Surgical    Comment: 1st intercourse 80 yo-Fewer than 5 partners  Other Topics Concern   Not on file  Social History Narrative   Not on file   Social Determinants of Health   Financial Resource Strain: Not on file  Food Insecurity: Not on file  Transportation Needs: Not on file  Physical Activity: Not on file  Stress: Not on file  Social Connections: Not on file  Intimate Partner Violence: Not on file    FAMILY HISTORY: Family History  Problem Relation Age of Onset   Cancer Mother        LUNG   Hypertension Sister    Cancer Sister        LUNG, THYROID, UTERINE   Cancer Brother        LUNG   Cancer Sister        Lung cancer   Colon cancer Neg Hx     ALLERGIES:  is allergic to other and hydrocodone-acetaminophen.  MEDICATIONS:  Current Outpatient Medications  Medication Sig Dispense Refill   ALPRAZolam (XANAX) 0.25 MG tablet Take 0.25 mg by mouth at bedtime.     Ascorbic Acid (VITAMIN C) 100 MG tablet Take 100 mg by mouth daily.     aspirin EC 81 MG tablet Take 1 tablet (81 mg total) by mouth daily. Swallow whole. 90 tablet 3   Cholecalciferol (VITAMIN D3) 25 MCG (1000 UT) CAPS Take 1,000 Units by mouth daily.     ciclopirox (LOPROX) 0.77 % cream as needed.     cycloSPORINE (RESTASIS) 0.05 % ophthalmic emulsion Place 1 drop into both eyes 2 (two) times daily.     folic acid (FOLVITE) 1 MG tablet Take 1 mg by mouth daily.     ibuprofen (ADVIL,MOTRIN) 200 MG tablet Take 200 mg by mouth every 6 (six) hours as needed for headache or moderate pain.      levocetirizine (XYZAL) 5 MG tablet Take 5 mg by mouth every evening.     losartan (COZAAR) 50 MG tablet Take 1 tablet (50 mg total) by mouth daily. 90 tablet 3   mupirocin ointment (BACTROBAN) 2 % Apply 1 application topically daily. 22 g 0   rosuvastatin (CRESTOR) 10 MG tablet Take 1 tablet (10 mg total) by mouth daily. 90 tablet 3   dicyclomine (BENTYL) 10 MG capsule Take 10 mg by mouth 4 (four) times daily -  before meals and at bedtime. (Patient not taking: Reported on 05/06/2021)     famotidine (PEPCID) 20 MG tablet Take 20 mg by mouth 2 (two)  times daily as needed. (Patient not taking: Reported on 05/06/2021)     fluticasone (FLONASE) 50 MCG/ACT nasal spray Place 1 spray into both nostrils daily as needed for allergies or rhinitis. (Patient not taking: Reported on 05/06/2021)     hydroxychloroquine (PLAQUENIL) 200 MG tablet Take 400 mg by mouth daily. (Patient not taking: Reported on 05/06/2021)     hydrOXYzine (ATARAX/VISTARIL) 25 MG tablet Take 25-50 mg by mouth at bedtime as needed.     loratadine (CLARITIN) 10 MG tablet Take 10 mg by mouth as needed. (Patient not taking: Reported on 05/06/2021)     methotrexate (RHEUMATREX) 2.5 MG tablet Take 2.5 mg by mouth See admin instructions. Take 7.5 mg by mouth daily on Friday and take 7.5 mg by mouth daily on Saturday. (Patient not taking: Reported on 05/06/2021)     nitroGLYCERIN (NITROSTAT) 0.4 MG SL tablet Place 1 tablet (0.4 mg total) under the tongue every 5 (five) minutes as needed for chest pain. (Patient not taking: Reported on 05/06/2021) 25 tablet 3   ondansetron (ZOFRAN ODT) 4 MG disintegrating tablet 4mg  ODT q4 hours prn nausea/vomit (Patient not taking: Reported on 05/06/2021) 10 tablet 0   pantoprazole (PROTONIX) 40 MG tablet Take 40 mg by mouth daily. (Patient not taking: Reported on 05/06/2021)     No current facility-administered medications for this visit.     PHYSICAL EXAMINATION: ECOG PERFORMANCE STATUS: 1 - Symptomatic but  completely ambulatory Vitals:   05/06/21 1106  BP: (!) 124/55  Pulse: 67  Resp: 17  Temp: 98.1 F (36.7 C)  SpO2: 100%   Filed Weights   05/06/21 1106  Weight: 155 lb (70.3 kg)    Physical Exam Constitutional:      General: She is not in acute distress. HENT:     Head: Normocephalic and atraumatic.  Eyes:     General: No scleral icterus. Cardiovascular:     Rate and Rhythm: Normal rate and regular rhythm.     Heart sounds: Normal heart sounds.  Pulmonary:     Effort: Pulmonary effort is normal. No respiratory distress.     Breath sounds: No wheezing.  Abdominal:     General: Bowel sounds are normal. There is no distension.     Palpations: Abdomen is soft.  Musculoskeletal:        General: Deformity (Bilateral hand joints) present. Normal range of motion.     Cervical back: Normal range of motion and neck supple.  Skin:    General: Skin is warm and dry.     Findings: No erythema or rash.  Neurological:     Mental Status: She is alert and oriented to person, place, and time. Mental status is at baseline.     Cranial Nerves: No cranial nerve deficit.     Coordination: Coordination normal.  Psychiatric:        Mood and Affect: Mood normal.    LABORATORY DATA:  I have reviewed the data as listed Lab Results  Component Value Date   WBC 3.1 (L) 05/06/2021   HGB 10.5 (L) 05/06/2021   HCT 34.2 (L) 05/06/2021   MCV 100.9 (H) 05/06/2021   PLT 127 (L) 05/06/2021   Recent Labs    06/26/20 1204 08/02/20 0925 11/25/20 0905 04/24/21 1209 05/06/21 1202  NA 129* 140  --   --  135  K 4.1 4.4  --   --  4.4  CL 98 102  --   --  101  CO2 23 22  --   --  28  GLUCOSE 104* 87  --   --  123*  BUN 17 15  --   --  13  CREATININE 1.14* 0.95  --  1.00 1.26*  CALCIUM 8.2* 8.6*  --   --  8.0*  GFRNONAA 50* 58*  --   --  44*  GFRAA  --  67  --   --   --   PROT 6.2*  --   --   --  5.9*  ALBUMIN 3.2*  --   --   --  3.2*  AST 24  --   --   --  24  ALT 17  --  12  --  14   ALKPHOS 58  --   --   --  94  BILITOT 0.8  --   --   --  0.4   Iron/TIBC/Ferritin/ %Sat No results found for: IRON, TIBC, FERRITIN, IRONPCTSAT    RADIOGRAPHIC STUDIES: I have personally reviewed the radiological images as listed and agreed with the findings in the report. CT Angio Chest Pulmonary Embolism (PE) W or WO Contrast  Result Date: 04/24/2021 CLINICAL DATA:  Elevated D-dimer, shortness of breath, chest pain EXAM: CT ANGIOGRAPHY CHEST WITH CONTRAST TECHNIQUE: Multidetector CT imaging of the chest was performed using the standard protocol during bolus administration of intravenous contrast. Multiplanar CT image reconstructions and MIPs were obtained to evaluate the vascular anatomy. CONTRAST:  41mL OMNIPAQUE IOHEXOL 350 MG/ML SOLN COMPARISON:  08/05/2020, 05/23/2009 FINDINGS: Cardiovascular: Satisfactory opacification of the pulmonary arteries to the segmental level. No evidence of pulmonary embolism. Normal heart size. Scattered coronary artery calcifications. No pericardial effusion. Aortic atherosclerosis. Mediastinum/Nodes: No enlarged mediastinal, hilar, or axillary lymph nodes. Thyroid gland, trachea, and esophagus demonstrate no significant findings. Lungs/Pleura: Mild centrilobular and paraseptal emphysema. Ground-glass nodule of the right pulmonary apex measuring 1.0 cm is stable in comparison to remote prior examination dated 05/23/2009 and benign (series 7, image 14). Bullous scarring of the dependent left lung base, sequelae of prior infection or aspiration (series 7, image 59). No pleural effusion or pneumothorax. Upper Abdomen: No acute abnormality. Musculoskeletal: No chest wall abnormality. No acute or significant osseous findings. Review of the MIP images confirms the above findings. IMPRESSION: 1. Negative examination for pulmonary embolism. 2. Emphysema. 3. Coronary artery disease. Aortic Atherosclerosis (ICD10-I70.0) and Emphysema (ICD10-J43.9). Electronically Signed   By:  Delanna Ahmadi M.D.   On: 04/24/2021 12:57      ASSESSMENT & PLAN:  1. Leukopenia, unspecified type   2. Adverse effect of methotrexate, initial encounter   3. Rheumatoid arthritis involving multiple sites, unspecified whether rheumatoid factor present ([place])    Labs reviewed with patientAnd husband. 04/23/2021, CBC showed total WBC 1.9, hemoglobin 9.7, there was decrease of absolute neutrophils and lymphocytes.  Increased eosinophil percentage. I think that most likely Leukopenia and anemia are secondary to methotrexate toxicities. I anticipate that her counts probably will improve on today's blood work since she has been off methotrexate for about a week. Rule out other etiologies Check CBC, smear, LDH, flow cytometry, B12, folate, reticulocyte panel, Hepatitis and HIV.  Recommend patient continue folic acid supplementation.  Rheumatoid arthritis, off methotrexate.  I encourage patient to further discuss with rheumatologist about Biologics.For arthralgia, currently she is on NSAIDs as needed.  Recommend patient to limit  NSAIDs to short-term use.given that patient has chronic kidney disease.  Patient may need to have narcotics if pain is not controlled.  I recommend patient to further discuss with Her rheumatologist.  Orders Placed This Encounter  Procedures   CBC with Differential/Platelet    Standing Status:   Future    Number of Occurrences:   1    Standing Expiration Date:   05/06/2022   Technologist smear review    Standing Status:   Future    Number of Occurrences:   1    Standing Expiration Date:   05/06/2022   Comprehensive metabolic panel    Standing Status:   Future    Number of Occurrences:   1    Standing Expiration Date:   05/06/2022   Folate    Standing Status:   Future    Number of Occurrences:   1    Standing Expiration Date:   05/06/2022   Vitamin B12    Standing Status:   Future    Number of Occurrences:   1    Standing Expiration Date:   05/06/2022   Flow  cytometry panel-leukemia/lymphoma work-up    Standing Status:   Future    Number of Occurrences:   1    Standing Expiration Date:   05/06/2022   HIV Antibody (routine testing w rflx)    Standing Status:   Future    Number of Occurrences:   1    Standing Expiration Date:   05/06/2022   Hepatitis panel, acute    Standing Status:   Future    Number of Occurrences:   1    Standing Expiration Date:   05/06/2022    All questions were answered. The patient knows to call the clinic with any problems questions or concerns.  cc Kirk Ruths, MD    Return of visit: 2 weeks to go over results.  Thank you for this kind referral and the opportunity to participate in the care of this patient. A copy of today's note is routed to referring provider    Earlie Server, MD, PhD Hematology Oncology [place] at [facility]  05/06/2021

## 2021-05-06 NOTE — Progress Notes (Signed)
Patient here for initial oncology appointment, expresses concerns of fatigue 

## 2021-05-08 LAB — HEPATITIS C ANTIBODY: HCV Ab: <0.1 s/co ratio — AB (ref 0.0–0.9)

## 2021-05-08 LAB — COMP PANEL: LEUKEMIA/LYMPHOMA

## 2021-05-09 LAB — HEPATITIS PANEL, ACUTE
Hep A IgM: NONREACTIVE
Hep B C IgM: NONREACTIVE
Hepatitis B Surface Ag: NONREACTIVE

## 2021-05-19 ENCOUNTER — Encounter: Payer: Self-pay | Admitting: Oncology

## 2021-05-19 ENCOUNTER — Inpatient Hospital Stay (HOSPITAL_BASED_OUTPATIENT_CLINIC_OR_DEPARTMENT_OTHER): Payer: Medicare HMO | Admitting: Oncology

## 2021-05-19 ENCOUNTER — Other Ambulatory Visit: Payer: Self-pay

## 2021-05-19 VITALS — BP 119/64 | HR 56 | Temp 98.3°F | Wt 155.1 lb

## 2021-05-19 DIAGNOSIS — D696 Thrombocytopenia, unspecified: Secondary | ICD-10-CM | POA: Diagnosis not present

## 2021-05-19 DIAGNOSIS — T451X5A Adverse effect of antineoplastic and immunosuppressive drugs, initial encounter: Secondary | ICD-10-CM | POA: Diagnosis not present

## 2021-05-19 DIAGNOSIS — D72819 Decreased white blood cell count, unspecified: Secondary | ICD-10-CM | POA: Diagnosis not present

## 2021-05-19 DIAGNOSIS — D649 Anemia, unspecified: Secondary | ICD-10-CM

## 2021-05-19 DIAGNOSIS — M069 Rheumatoid arthritis, unspecified: Secondary | ICD-10-CM

## 2021-05-19 MED ORDER — VITAMIN B-12 1000 MCG PO TABS: 1000.0000 ug | ORAL_TABLET | Freq: Every day | ORAL | 0 refills | Status: DC

## 2021-05-19 NOTE — Progress Notes (Signed)
Hematology/Oncology progress note [facility] [phone] [phone]   Patient Care Team: Kirk Ruths, MD as PCP - General (Internal Medicine) Jerline Pain, MD as PCP - Cardiology (Cardiology)  REFERRING PROVIDER: Kirk Ruths, MD  CHIEF COMPLAINTS/REASON FOR VISIT:  Follow-up of thrombocytopenia, anemia, leukopenia  HISTORY OF PRESENTING ILLNESS:   Bethany Grant is a  80 y.o.  female with PMH listed below was seen in consultation at the request of  Kirk Ruths, MD  for evaluation of leukopenia  Patient has rheumatoid arthritis and has been on methotrexate until recently, her CBC shows leukopenia and she was advised by rheumatologist to come off methotrexate.  Currently she takes NSAIDs as needed for pain. Patient denies any unintentional weight loss, fever, night sweats.  She was accompanied by her husband. Patient reports feeling fatigued, shortness of breath, insomnia, and since she came off methotrexate, she has felt much better.  INTERVAL HISTORY Bethany Grant is a 80 y.o. female who has above history reviewed by me today presents for follow up visit for cytopenia Patient has been off methotrexate for about 3 weeks.  She clinically feels better.  Fatigue has improved. Patient has severe arthralgia for which she takes Tylenol during the day and ibuprofen at night.  Review of Systems  Constitutional:  Negative for appetite change, chills, fatigue and fever.  HENT:   Negative for hearing loss and voice change.   Eyes:  Negative for eye problems.  Respiratory:  Negative for chest tightness, cough and shortness of breath.   Cardiovascular:  Negative for chest pain.  Gastrointestinal:  Negative for abdominal distention, abdominal pain and blood in stool.  Endocrine: Negative for hot flashes.  Genitourinary:  Negative for difficulty urinating and frequency.   Musculoskeletal:  Positive for arthralgias.  Skin:   Negative for itching and rash.  Neurological:  Negative for extremity weakness.  Hematological:  Negative for adenopathy.  Psychiatric/Behavioral:  Positive for sleep disturbance. Negative for confusion.    MEDICAL HISTORY:  Past Medical History:  Diagnosis Date   Arthritis    Hypercholesterolemia    Hypertension    Osteopenia 09/2017   T score -2.1 FRAX 18% / 2.7%   PONV (postoperative nausea and vomiting)    Rheumatoid arthritis(714.0)     SURGICAL HISTORY: Past Surgical History:  Procedure Laterality Date   ABDOMINAL HYSTERECTOMY  1991   TAH/BSO   BARTHOLIN GLAND CYST EXCISION  1980   RIGHT   BREAST SURGERY  1988   BREAST CYST   COLONOSCOPY     COLONOSCOPY N/A 08/24/2013   Procedure: COLONOSCOPY;  Surgeon: Rogene Houston, MD;  [facility];  Service: Endoscopy;  Laterality: N/A;  100   COLONOSCOPY WITH PROPOFOL N/A 09/09/2020   Procedure: COLONOSCOPY WITH PROPOFOL;  Surgeon: Toledo, Benay Pike, MD;  Location: [facility];  Service: Gastroenterology;  Laterality: N/A;   ESOPHAGOGASTRODUODENOSCOPY (EGD) WITH PROPOFOL N/A 09/09/2020   Procedure: ESOPHAGOGASTRODUODENOSCOPY (EGD) WITH PROPOFOL;  Surgeon: Toledo, Benay Pike, MD;  Location: [facility];  Service: Gastroenterology;  Laterality: N/A;   FOOT SURGERY     2 TIMES   FOOT SURGERY Left    HAND SURGERY     HEMORRHOID SURGERY     been over 20 years ago   OLECRANON BURSECTOMY Right 11/29/2013   Procedure: EXCISION OLECRANON BURSA RIGHT ;  Surgeon: Wynonia Sours, MD;  Location: [place];  Service: Orthopedics;  Laterality: Right;   OOPHORECTOMY  BSO   TENOSYNOVECTOMY Right 11/29/2013   Procedure: TENOSYNOVECTOMY FLEXOR TENDON RIGHT WRIST ;  Surgeon: Wynonia Sours, MD;  Location: [place];  Service: Orthopedics;  Laterality: Right;   WEIL OSTEOTOMY Right 08/10/2018   Procedure: WEIL OSTEOTOMY 2, 3,4 METATARSAL RIGHT FOOT, PROXIMAL INTERPHALANGEAL RESECTION TOES 2, 3, 4;   Surgeon: Newt Minion, MD;  Location: [place];  Service: Orthopedics;  Laterality: Right;    SOCIAL HISTORY: Social History   Socioeconomic History   Marital status: Married    Spouse name: Not on file   Number of children: Not on file   Years of education: Not on file   Highest education level: Not on file  Occupational History   Not on file  Tobacco Use   Smoking status: Former    Packs/day: 0.80    Years: 30.00    Pack years: 24.00    Types: Cigarettes    Quit date: 07/27/2004    Years since quitting: 16.8   Smokeless tobacco: Never  Vaping Use   Vaping Use: Never used  Substance and Sexual Activity   Alcohol use: No    Alcohol/week: 0.0 standard drinks   Drug use: No   Sexual activity: Not Currently    Birth control/protection: Surgical    Comment: 1st intercourse 80 yo-Fewer than 5 partners  Other Topics Concern   Not on file  Social History Narrative   Not on file   Social Determinants of Health   Financial Resource Strain: Not on file  Food Insecurity: Not on file  Transportation Needs: Not on file  Physical Activity: Not on file  Stress: Not on file  Social Connections: Not on file  Intimate Partner Violence: Not on file    FAMILY HISTORY: Family History  Problem Relation Age of Onset   Cancer Mother        LUNG   Hypertension Sister    Cancer Sister        LUNG, THYROID, UTERINE   Cancer Brother        LUNG   Cancer Sister        Lung cancer   Colon cancer Neg Hx     ALLERGIES:  is allergic to other and hydrocodone-acetaminophen.  MEDICATIONS:  Current Outpatient Medications  Medication Sig Dispense Refill   ALPRAZolam (XANAX) 0.25 MG tablet Take 0.25 mg by mouth at bedtime.     Ascorbic Acid (VITAMIN C) 100 MG tablet Take 100 mg by mouth daily.     aspirin EC 81 MG tablet Take 1 tablet (81 mg total) by mouth daily. Swallow whole. 90 tablet 3   Cholecalciferol (VITAMIN D3) 25 MCG (1000 UT) CAPS Take 1,000 Units by mouth daily.      ciclopirox (LOPROX) 0.77 % cream as needed.     cycloSPORINE (RESTASIS) 0.05 % ophthalmic emulsion Place 1 drop into both eyes 2 (two) times daily.     folic acid (FOLVITE) 1 MG tablet Take 1 mg by mouth daily.     hydrOXYzine (ATARAX/VISTARIL) 25 MG tablet Take 25-50 mg by mouth at bedtime as needed.     ibuprofen (ADVIL,MOTRIN) 200 MG tablet Take 200 mg by mouth every 6 (six) hours as needed for headache or moderate pain.     levocetirizine (XYZAL) 5 MG tablet Take 5 mg by mouth every evening.     losartan (COZAAR) 50 MG tablet Take 1 tablet (50 mg total) by mouth daily. 90 tablet 3   vitamin B-12 (CYANOCOBALAMIN) 1000  MCG tablet Take 1 tablet (1,000 mcg total) by mouth daily. 90 tablet 0   dicyclomine (BENTYL) 10 MG capsule Take 10 mg by mouth 4 (four) times daily -  before meals and at bedtime. (Patient not taking: Reported on 05/06/2021)     famotidine (PEPCID) 20 MG tablet Take 20 mg by mouth 2 (two) times daily as needed. (Patient not taking: Reported on 05/06/2021)     fluticasone (FLONASE) 50 MCG/ACT nasal spray Place 1 spray into both nostrils daily as needed for allergies or rhinitis. (Patient not taking: Reported on 05/19/2021)     hydroxychloroquine (PLAQUENIL) 200 MG tablet Take 400 mg by mouth daily. (Patient not taking: Reported on 05/06/2021)     loratadine (CLARITIN) 10 MG tablet Take 10 mg by mouth as needed. (Patient not taking: Reported on 05/06/2021)     methotrexate (RHEUMATREX) 2.5 MG tablet Take 2.5 mg by mouth See admin instructions. Take 7.5 mg by mouth daily on Friday and take 7.5 mg by mouth daily on Saturday. (Patient not taking: Reported on 05/06/2021)     mupirocin ointment (BACTROBAN) 2 % Apply 1 application topically daily. 22 g 0   nitroGLYCERIN (NITROSTAT) 0.4 MG SL tablet Place 1 tablet (0.4 mg total) under the tongue every 5 (five) minutes as needed for chest pain. (Patient not taking: Reported on 05/06/2021) 25 tablet 3   ondansetron (ZOFRAN ODT) 4 MG  disintegrating tablet 4mg  ODT q4 hours prn nausea/vomit (Patient not taking: Reported on 05/06/2021) 10 tablet 0   pantoprazole (PROTONIX) 40 MG tablet Take 40 mg by mouth daily. (Patient not taking: Reported on 05/06/2021)     rosuvastatin (CRESTOR) 10 MG tablet Take 1 tablet (10 mg total) by mouth daily. 90 tablet 3   No current facility-administered medications for this visit.     PHYSICAL EXAMINATION: ECOG PERFORMANCE STATUS: 1 - Symptomatic but completely ambulatory Vitals:   05/19/21 1407  BP: 119/64  Pulse: (!) 56  Temp: 98.3 F (36.8 C)   Filed Weights   05/19/21 1407  Weight: 155 lb 1.6 oz (70.4 kg)    Physical Exam Constitutional:      General: She is not in acute distress. HENT:     Head: Normocephalic and atraumatic.  Eyes:     General: No scleral icterus. Cardiovascular:     Rate and Rhythm: Normal rate and regular rhythm.     Heart sounds: Normal heart sounds.  Pulmonary:     Effort: Pulmonary effort is normal. No respiratory distress.     Breath sounds: No wheezing.  Abdominal:     General: Bowel sounds are normal. There is no distension.     Palpations: Abdomen is soft.  Musculoskeletal:        General: Deformity (Bilateral hand joints) present. Normal range of motion.     Cervical back: Normal range of motion and neck supple.  Skin:    General: Skin is warm and dry.     Findings: No erythema or rash.  Neurological:     Mental Status: She is alert and oriented to person, place, and time. Mental status is at baseline.     Cranial Nerves: No cranial nerve deficit.     Coordination: Coordination normal.  Psychiatric:        Mood and Affect: Mood normal.    LABORATORY DATA:  I have reviewed the data as listed Lab Results  Component Value Date   WBC 3.1 (L) 05/06/2021   HGB 10.5 (L) 05/06/2021   HCT  34.2 (L) 05/06/2021   MCV 100.9 (H) 05/06/2021   PLT 127 (L) 05/06/2021   Recent Labs    06/26/20 1204 08/02/20 0925 11/25/20 0905  04/24/21 1209 05/06/21 1202  NA 129* 140  --   --  135  K 4.1 4.4  --   --  4.4  CL 98 102  --   --  101  CO2 23 22  --   --  28  GLUCOSE 104* 87  --   --  123*  BUN 17 15  --   --  13  CREATININE 1.14* 0.95  --  1.00 1.26*  CALCIUM 8.2* 8.6*  --   --  8.0*  GFRNONAA 50* 58*  --   --  44*  GFRAA  --  67  --   --   --   PROT 6.2*  --   --   --  5.9*  ALBUMIN 3.2*  --   --   --  3.2*  AST 24  --   --   --  24  ALT 17  --  12  --  14  ALKPHOS 58  --   --   --  94  BILITOT 0.8  --   --   --  0.4    04/23/2021, CBC showed total WBC 1.9, hemoglobin 9.7, there was decrease of absolute neutrophils and lymphocytes.  Increased eosinophil percentage. Iron/TIBC/Ferritin/ %Sat No results found for: IRON, TIBC, FERRITIN, IRONPCTSAT    RADIOGRAPHIC STUDIES: I have personally reviewed the radiological images as listed and agreed with the findings in the report. CT Angio Chest Pulmonary Embolism (PE) W or WO Contrast  Result Date: 04/24/2021 CLINICAL DATA:  Elevated D-dimer, shortness of breath, chest pain EXAM: CT ANGIOGRAPHY CHEST WITH CONTRAST TECHNIQUE: Multidetector CT imaging of the chest was performed using the standard protocol during bolus administration of intravenous contrast. Multiplanar CT image reconstructions and MIPs were obtained to evaluate the vascular anatomy. CONTRAST:  67mL OMNIPAQUE IOHEXOL 350 MG/ML SOLN COMPARISON:  08/05/2020, 05/23/2009 FINDINGS: Cardiovascular: Satisfactory opacification of the pulmonary arteries to the segmental level. No evidence of pulmonary embolism. Normal heart size. Scattered coronary artery calcifications. No pericardial effusion. Aortic atherosclerosis. Mediastinum/Nodes: No enlarged mediastinal, hilar, or axillary lymph nodes. Thyroid gland, trachea, and esophagus demonstrate no significant findings. Lungs/Pleura: Mild centrilobular and paraseptal emphysema. Ground-glass nodule of the right pulmonary apex measuring 1.0 cm is stable in comparison to  remote prior examination dated 05/23/2009 and benign (series 7, image 14). Bullous scarring of the dependent left lung base, sequelae of prior infection or aspiration (series 7, image 59). No pleural effusion or pneumothorax. Upper Abdomen: No acute abnormality. Musculoskeletal: No chest wall abnormality. No acute or significant osseous findings. Review of the MIP images confirms the above findings. IMPRESSION: 1. Negative examination for pulmonary embolism. 2. Emphysema. 3. Coronary artery disease. Aortic Atherosclerosis (ICD10-I70.0) and Emphysema (ICD10-J43.9). Electronically Signed   By: Delanna Ahmadi M.D.   On: 04/24/2021 12:57      ASSESSMENT & PLAN:  1. Leukopenia, unspecified type   2. Adverse effect of methotrexate, initial encounter   3. Thrombocytopenia ([place])   4. Anemia, unspecified type   5. Rheumatoid arthritis involving multiple sites, unspecified whether rheumatoid factor present [facility])    Labs reviewed and discussed with patient. Leukopenia/anemia/thrombocytopenia likely secondary to methotrexate toxicities. Count has improved since the start of medication. Negative hepatitis panel, negative HIV, no significant immunophenotypic abnormality detected, adequate folate level  Borderline low vitamin B12 level  at 230.  Recommend patient to start empiric vitamin B12 1000 MCG daily.  SHe may use over-the-counter supply.  Prescription was sent to pharmacy to get her started.  Rheumatoid arthritis, off methotrexate. Recommend patient to make a follow-up appointment with rheumatologist for discussion of other options for rechallenge with methotrexate after counts completely resolved.  Arthralgia, patient takes NSAID chronically.  Kidney function has worsened.  Discussed with patient and recommend her to take breaks from NSAIDs.  All questions were answered. The patient knows to call the clinic with any problems questions or concerns.  cc Kirk Ruths, MD  Return of visit:  Patient will be discharged to follow-up with primary care provider/rheumatology.    Earlie Server, MD, PhD Hematology Oncology [place] at [facility]  05/19/2021

## 2021-07-26 ENCOUNTER — Other Ambulatory Visit: Payer: Self-pay | Admitting: Cardiology

## 2021-07-29 ENCOUNTER — Other Ambulatory Visit (HOSPITAL_COMMUNITY): Payer: Self-pay | Admitting: Internal Medicine

## 2021-07-29 DIAGNOSIS — Z1231 Encounter for screening mammogram for malignant neoplasm of breast: Secondary | ICD-10-CM

## 2021-08-06 ENCOUNTER — Other Ambulatory Visit: Payer: Self-pay

## 2021-08-06 ENCOUNTER — Ambulatory Visit (HOSPITAL_COMMUNITY)
Admission: RE | Admit: 2021-08-06 | Discharge: 2021-08-06 | Disposition: A | Discharge status: Home / Self Care | Payer: Medicare HMO | Source: Ambulatory Visit | Attending: Internal Medicine | Admitting: Internal Medicine

## 2021-08-06 DIAGNOSIS — Z1231 Encounter for screening mammogram for malignant neoplasm of breast: Secondary | ICD-10-CM | POA: Diagnosis not present

## 2021-08-11 ENCOUNTER — Telehealth: Payer: Self-pay | Admitting: *Deleted

## 2021-08-11 NOTE — Telephone Encounter (Signed)
Patient called regarding recent labs done with PCP and is asking what to do about her platelet count. Please advise as patient was discharged from clinic at her appointment in October  CBC w/auto Differential (5 Part) Order: [id number]  Ref Range & Units 3 d ago  WBC (White Blood Cell Count) 4.1 - 10.2 103/uL 4.8   RBC (Red Blood Cell Count) 4.04 - 5.48 106/uL 4.70   Hemoglobin 12.0 - 15.0 gm/dL 13.3   Hematocrit 35.0 - 47.0 % 43.5   MCV (Mean Corpuscular Volume) 80.0 - 100.0 fl 92.6   MCH (Mean Corpuscular Hemoglobin) 27.0 - 31.2 pg 28.3   MCHC (Mean Corpuscular Hemoglobin Concentration) 32.0 - 36.0 gm/dL 30.6 Low    Platelet Count 150 - 450 103/uL 93 Low    Comment: Results verified by repeat testing  Smear review agrees with analyzer results. Thrombocytopenia  RDW-CV (Red Cell Distribution Width) 11.6 - 14.8 % 13.3   MPV (Mean Platelet Volume) 9.4 - 12.4 fl 11.7   Neutrophils 1.50 - 7.80 103/uL 3.24   Lymphocytes 1.00 - 3.60 103/uL 0.94 Low    Monocytes 0.00 - 1.50 103/uL 0.43   Eosinophils 0.00 - 0.55 103/uL 0.13   Basophils 0.00 - 0.09 103/uL 0.03   Neutrophil % 32.0 - 70.0 % 68.0   Lymphocyte % 10.0 - 50.0 % 19.7   Monocyte % 4.0 - 13.0 % 9.0   Eosinophil % 1.0 - 5.0 % 2.7   Basophil% 0.0 - 2.0 % 0.6   Immature Granulocyte % <=0.7 % 0.0   Immature Granulocyte Count <=0.06 10^3/L 0.00   Resulting Agency  [facility]  Specimen Collected: 08/08/21 10:28 Last Resulted: 08/08/21 13:46  Received From: [place]  Result Received: 08/11/21 16:07

## 2021-08-19 ENCOUNTER — Inpatient Hospital Stay: Payer: Medicare HMO | Attending: Nurse Practitioner | Admitting: Nurse Practitioner

## 2021-08-19 ENCOUNTER — Inpatient Hospital Stay: Payer: Medicare HMO

## 2021-08-19 ENCOUNTER — Encounter: Payer: Self-pay | Admitting: Nurse Practitioner

## 2021-08-19 ENCOUNTER — Other Ambulatory Visit: Payer: Self-pay

## 2021-08-19 VITALS — BP 141/67 | HR 57 | Temp 97.8°F | Resp 19 | Wt 153.3 lb

## 2021-08-19 DIAGNOSIS — R0602 Shortness of breath: Secondary | ICD-10-CM | POA: Diagnosis not present

## 2021-08-19 DIAGNOSIS — Z801 Family history of malignant neoplasm of trachea, bronchus and lung: Secondary | ICD-10-CM | POA: Insufficient documentation

## 2021-08-19 DIAGNOSIS — I10 Essential (primary) hypertension: Secondary | ICD-10-CM | POA: Diagnosis not present

## 2021-08-19 DIAGNOSIS — R5383 Other fatigue: Secondary | ICD-10-CM | POA: Insufficient documentation

## 2021-08-19 DIAGNOSIS — M069 Rheumatoid arthritis, unspecified: Secondary | ICD-10-CM | POA: Diagnosis not present

## 2021-08-19 DIAGNOSIS — G4700 Insomnia, unspecified: Secondary | ICD-10-CM | POA: Insufficient documentation

## 2021-08-19 DIAGNOSIS — D696 Thrombocytopenia, unspecified: Secondary | ICD-10-CM | POA: Diagnosis present

## 2021-08-19 DIAGNOSIS — D72819 Decreased white blood cell count, unspecified: Secondary | ICD-10-CM

## 2021-08-19 DIAGNOSIS — E539 Vitamin B deficiency, unspecified: Secondary | ICD-10-CM | POA: Insufficient documentation

## 2021-08-19 DIAGNOSIS — Z87891 Personal history of nicotine dependence: Secondary | ICD-10-CM | POA: Insufficient documentation

## 2021-08-19 DIAGNOSIS — Z90710 Acquired absence of both cervix and uterus: Secondary | ICD-10-CM | POA: Insufficient documentation

## 2021-08-19 LAB — CBC WITH DIFFERENTIAL/PLATELET
Abs Immature Granulocytes: 0.01 10*3/uL (ref 0.00–0.07)
Basophils Absolute: 0 10*3/uL (ref 0.0–0.1)
Basophils Relative: 1 %
Eosinophils Absolute: 0.1 10*3/uL (ref 0.0–0.5)
Eosinophils Relative: 3 %
HCT: 43.6 % (ref 36.0–46.0)
Hemoglobin: 13.6 g/dL (ref 12.0–15.0)
Immature Granulocytes: 0 %
Lymphocytes Relative: 19 %
Lymphs Abs: 1 10*3/uL (ref 0.7–4.0)
MCH: 28.3 pg (ref 26.0–34.0)
MCHC: 31.2 g/dL (ref 30.0–36.0)
MCV: 90.6 fL (ref 80.0–100.0)
Monocytes Absolute: 0.5 10*3/uL (ref 0.1–1.0)
Monocytes Relative: 10 %
Neutro Abs: 3.4 10*3/uL (ref 1.7–7.7)
Neutrophils Relative %: 67 %
Platelets: 153 10*3/uL (ref 150–400)
RBC: 4.81 MIL/uL (ref 3.87–5.11)
RDW: 13.3 % (ref 11.5–15.5)
WBC: 5.1 10*3/uL (ref 4.0–10.5)
nRBC: 0 % (ref 0.0–0.2)

## 2021-08-19 LAB — COMPREHENSIVE METABOLIC PANEL
ALT: 15 U/L (ref 0–44)
AST: 24 U/L (ref 15–41)
Albumin: 4.2 g/dL (ref 3.5–5.0)
Alkaline Phosphatase: 126 U/L (ref 38–126)
Anion gap: 10 (ref 5–15)
BUN: 17 mg/dL (ref 8–23)
CO2: 29 mmol/L (ref 22–32)
Calcium: 9.3 mg/dL (ref 8.9–10.3)
Chloride: 99 mmol/L (ref 98–111)
Creatinine, Ser: 0.98 mg/dL (ref 0.44–1.00)
GFR, Estimated: 59 mL/min — ABNORMAL LOW (ref >60)
Glucose, Bld: 93 mg/dL (ref 70–99)
Potassium: 4.5 mmol/L (ref 3.5–5.1)
Sodium: 138 mmol/L (ref 135–145)
Total Bilirubin: 0.5 mg/dL (ref 0.3–1.2)
Total Protein: 7.6 g/dL (ref 6.5–8.1)

## 2021-08-19 LAB — LACTATE DEHYDROGENASE: LDH: 153 U/L (ref 98–192)

## 2021-08-19 LAB — PROTIME-INR
INR: 1.1 (ref 0.8–1.2)
Prothrombin Time: 13.7 seconds (ref 11.4–15.2)

## 2021-08-19 LAB — VITAMIN B12: Vitamin B-12: 543 pg/mL (ref 180–914)

## 2021-08-19 LAB — IRON AND TIBC
Iron: 87 ug/dL (ref 28–170)
Saturation Ratios: 23 % (ref 10.4–31.8)
TIBC: 372 ug/dL (ref 250–450)
UIBC: 285 ug/dL

## 2021-08-19 LAB — APTT: aPTT: 32 seconds (ref 24–36)

## 2021-08-19 LAB — PATHOLOGIST SMEAR REVIEW

## 2021-08-19 LAB — FOLATE: Folate: 66 ng/mL (ref >5.9)

## 2021-08-19 LAB — FERRITIN: Ferritin: 25 ng/mL (ref 11–307)

## 2021-08-19 NOTE — Progress Notes (Signed)
Hematology/Oncology progress note [facility] Telephone:([phone] Fax:([phone]   Patient Care Team: Kirk Ruths, MD as PCP - General (Internal Medicine) Jerline Pain, MD as PCP - Cardiology (Cardiology)  REFERRING PROVIDER: Kirk Ruths, MD   CHIEF COMPLAINTS/REASON FOR VISIT:  Follow-up of thrombocytopenia, anemia, leukopenia  HISTORY OF PRESENTING ILLNESS:   Bethany Grant is a  81 y.o.  female with PMH listed below was seen in consultation at the request of  Kirk Ruths, MD  for evaluation of leukopenia  Patient has rheumatoid arthritis and has been on methotrexate until recently, her CBC shows leukopenia and she was advised by rheumatologist to come off methotrexate.  Currently she takes NSAIDs as needed for pain. Patient denies any unintentional weight loss, fever, night sweats.  She was accompanied by her husband. Patient reports feeling fatigued, shortness of breath, insomnia, and since she came off methotrexate, she has felt much better.  Hepatitis panel was negative, HIV negative, flow cytometry negative, b12 borderline low, folate normal, slightly decreased platelet count on peripheral smear but otherwise normal appearing.    INTERVAL HISTORY Bethany Grant is a 81 y.o. female who returns to clinic for follow up. She continues to be off methotrexate d/t previous cytopenias. However, at pcp, she was noted to have low platelet count and he recommended she return for evaluation. She has ongoing fatigue and arthritic pains. She has been taking otc tylenol and avoids NSAIDs. She is followed by Dr. Ouida Sills, PCP and sees Dr. Amil Amen at [facility] rheumatology.   Review of Systems  Constitutional:  Negative for appetite change, chills, fatigue and fever.  HENT:   Negative for hearing loss and voice change.   Eyes:  Negative for eye problems.  Respiratory:  Negative for chest tightness, cough and shortness of breath.    Cardiovascular:  Negative for chest pain.  Gastrointestinal:  Negative for abdominal distention, abdominal pain and blood in stool.  Endocrine: Negative for hot flashes.  Genitourinary:  Negative for difficulty urinating and frequency.   Musculoskeletal:  Positive for arthralgias.  Skin:  Negative for itching and rash.  Neurological:  Negative for extremity weakness.  Hematological:  Negative for adenopathy.  Psychiatric/Behavioral:  Positive for sleep disturbance. Negative for confusion.    MEDICAL HISTORY:  Past Medical History:  Diagnosis Date   Arthritis    Hypercholesterolemia    Hypertension    Osteopenia 09/2017   T score -2.1 FRAX 18% / 2.7%   PONV (postoperative nausea and vomiting)    Rheumatoid arthritis(714.0)     SURGICAL HISTORY: Past Surgical History:  Procedure Laterality Date   ABDOMINAL HYSTERECTOMY  1991   TAH/BSO   BARTHOLIN GLAND CYST EXCISION  1980   RIGHT   BREAST SURGERY  1988   BREAST CYST   COLONOSCOPY     COLONOSCOPY N/A 08/24/2013   Procedure: COLONOSCOPY;  Surgeon: Rogene Houston, MD;  [facility]: [facility];  Service: Endoscopy;  Laterality: N/A;  100   COLONOSCOPY WITH PROPOFOL N/A 09/09/2020   Procedure: COLONOSCOPY WITH PROPOFOL;  Surgeon: Toledo, Benay Pike, MD;  Location: [facility];  Service: Gastroenterology;  Laterality: N/A;   ESOPHAGOGASTRODUODENOSCOPY (EGD) WITH PROPOFOL N/A 09/09/2020   Procedure: ESOPHAGOGASTRODUODENOSCOPY (EGD) WITH PROPOFOL;  Surgeon: Toledo, Benay Pike, MD;  Location: [facility];  Service: Gastroenterology;  Laterality: N/A;   FOOT SURGERY     2 TIMES   FOOT SURGERY Left    HAND SURGERY     HEMORRHOID SURGERY  been over 20 years ago   OLECRANON BURSECTOMY Right 11/29/2013   Procedure: EXCISION OLECRANON BURSA RIGHT ;  Surgeon: Wynonia Sours, MD;  Location: [place];  Service: Orthopedics;  Laterality: Right;   OOPHORECTOMY     BSO   TENOSYNOVECTOMY Right 11/29/2013   Procedure:  TENOSYNOVECTOMY FLEXOR TENDON RIGHT WRIST ;  Surgeon: Wynonia Sours, MD;  Location: [place];  Service: Orthopedics;  Laterality: Right;   WEIL OSTEOTOMY Right 08/10/2018   Procedure: WEIL OSTEOTOMY 2, 3,4 METATARSAL RIGHT FOOT, PROXIMAL INTERPHALANGEAL RESECTION TOES 2, 3, 4;  Surgeon: Newt Minion, MD;  Location: [place];  Service: Orthopedics;  Laterality: Right;    SOCIAL HISTORY: Social History   Socioeconomic History   Marital status: Married    Spouse name: Not on file   Number of children: Not on file   Years of education: Not on file   Highest education level: Not on file  Occupational History   Not on file  Tobacco Use   Smoking status: Former    Packs/day: 0.80    Years: 30.00    Pack years: 24.00    Types: Cigarettes    Quit date: 07/27/2004    Years since quitting: 17.0   Smokeless tobacco: Never  Vaping Use   Vaping Use: Never used  Substance and Sexual Activity   Alcohol use: No    Alcohol/week: 0.0 standard drinks   Drug use: No   Sexual activity: Not Currently    Birth control/protection: Surgical    Comment: 1st intercourse 81 yo-Fewer than 5 partners  Other Topics Concern   Not on file  Social History Narrative   Not on file   Social Determinants of Health   Financial Resource Strain: Not on file  Food Insecurity: Not on file  Transportation Needs: Not on file  Physical Activity: Not on file  Stress: Not on file  Social Connections: Not on file  Intimate Partner Violence: Not on file    FAMILY HISTORY: Family History  Problem Relation Age of Onset   Cancer Mother        LUNG   Hypertension Sister    Cancer Sister        LUNG, THYROID, UTERINE   Cancer Brother        LUNG   Cancer Sister        Lung cancer   Colon cancer Neg Hx     ALLERGIES:  is allergic to other and hydrocodone-acetaminophen.  MEDICATIONS:  Current Outpatient Medications  Medication Sig Dispense Refill   ALPRAZolam (XANAX) 0.25 MG tablet Take 0.25  mg by mouth at bedtime.     Ascorbic Acid (VITAMIN C) 100 MG tablet Take 100 mg by mouth daily.     aspirin EC 81 MG tablet Take 1 tablet (81 mg total) by mouth daily. Swallow whole. 90 tablet 3   Cholecalciferol (VITAMIN D3) 25 MCG (1000 UT) CAPS Take 1,000 Units by mouth daily.     cycloSPORINE (RESTASIS) 0.05 % ophthalmic emulsion Place 1 drop into both eyes 2 (two) times daily.     folic acid (FOLVITE) 1 MG tablet Take 1 mg by mouth daily.     ibuprofen (ADVIL,MOTRIN) 200 MG tablet Take 200 mg by mouth every 6 (six) hours as needed for headache or moderate pain.     losartan (COZAAR) 50 MG tablet Take 1 tablet (50 mg total) by mouth daily. 90 tablet 3   rosuvastatin (CRESTOR) 10 MG tablet  TAKE 1 TABLET BY MOUTH EVERY DAY 90 tablet 2   vitamin B-12 (CYANOCOBALAMIN) 1000 MCG tablet Take 1 tablet (1,000 mcg total) by mouth daily. 90 tablet 0   ciclopirox (LOPROX) 0.77 % cream as needed. (Patient not taking: Reported on 08/19/2021)     dicyclomine (BENTYL) 10 MG capsule Take 10 mg by mouth 4 (four) times daily -  before meals and at bedtime. (Patient not taking: Reported on 05/06/2021)     famotidine (PEPCID) 20 MG tablet Take 20 mg by mouth 2 (two) times daily as needed. (Patient not taking: Reported on 05/06/2021)     fluticasone (FLONASE) 50 MCG/ACT nasal spray Place 1 spray into both nostrils daily as needed for allergies or rhinitis. (Patient not taking: Reported on 05/19/2021)     hydroxychloroquine (PLAQUENIL) 200 MG tablet Take 400 mg by mouth daily. (Patient not taking: Reported on 05/06/2021)     hydrOXYzine (ATARAX/VISTARIL) 25 MG tablet Take 25-50 mg by mouth at bedtime as needed.     levocetirizine (XYZAL) 5 MG tablet Take 5 mg by mouth every evening.     loratadine (CLARITIN) 10 MG tablet Take 10 mg by mouth as needed. (Patient not taking: Reported on 05/06/2021)     methotrexate (RHEUMATREX) 2.5 MG tablet Take 2.5 mg by mouth See admin instructions. Take 7.5 mg by mouth daily on  Friday and take 7.5 mg by mouth daily on Saturday. (Patient not taking: Reported on 05/06/2021)     mupirocin ointment (BACTROBAN) 2 % Apply 1 application topically daily. (Patient not taking: Reported on 08/19/2021) 22 g 0   nitroGLYCERIN (NITROSTAT) 0.4 MG SL tablet Place 1 tablet (0.4 mg total) under the tongue every 5 (five) minutes as needed for chest pain. (Patient not taking: Reported on 05/06/2021) 25 tablet 3   ondansetron (ZOFRAN ODT) 4 MG disintegrating tablet 4mg  ODT q4 hours prn nausea/vomit (Patient not taking: Reported on 05/06/2021) 10 tablet 0   pantoprazole (PROTONIX) 40 MG tablet Take 40 mg by mouth daily. (Patient not taking: Reported on 05/06/2021)     No current facility-administered medications for this visit.     PHYSICAL EXAMINATION: ECOG PERFORMANCE STATUS: 1 - Symptomatic but completely ambulatory Vitals:   08/19/21 1019  BP: (!) 141/67  Pulse: (!) 57  Resp: 19  Temp: 97.8 F (36.6 C)  SpO2: 98%   Filed Weights   08/19/21 1019  Weight: 153 lb 4.8 oz (69.5 kg)    Physical Exam Constitutional:      General: She is not in acute distress.    Appearance: She is not ill-appearing.  Eyes:     General: No scleral icterus. Cardiovascular:     Rate and Rhythm: Normal rate and regular rhythm.  Pulmonary:     Effort: Pulmonary effort is normal. No respiratory distress.  Abdominal:     General: There is no distension.     Palpations: Abdomen is soft.  Musculoskeletal:        General: Deformity (Bilateral hand joints) present. Normal range of motion.  Skin:    General: Skin is warm and dry.     Findings: Bruising (mild bruising of the hands and forearms) present. No erythema or rash.  Neurological:     Mental Status: She is alert and oriented to person, place, and time.     Cranial Nerves: No cranial nerve deficit.     Coordination: Coordination normal.  Psychiatric:        Mood and Affect: Mood normal.  Behavior: Behavior normal.    LABORATORY  DATA:  I have reviewed the data as listed Lab Results  Component Value Date   WBC 3.1 (L) 05/06/2021   HGB 10.5 (L) 05/06/2021   HCT 34.2 (L) 05/06/2021   MCV 100.9 (H) 05/06/2021   PLT 127 (L) 05/06/2021   Recent Labs    11/25/20 0905 04/24/21 1209 05/06/21 1202  NA  --   --  135  K  --   --  4.4  CL  --   --  101  CO2  --   --  28  GLUCOSE  --   --  123*  BUN  --   --  13  CREATININE  --  1.00 1.26*  CALCIUM  --   --  8.0*  GFRNONAA  --   --  44*  PROT  --   --  5.9*  ALBUMIN  --   --  3.2*  AST  --   --  24  ALT 12  --  14  ALKPHOS  --   --  94  BILITOT  --   --  0.4    04/23/2021, CBC showed total WBC 1.9, hemoglobin 9.7, there was decrease of absolute neutrophils and lymphocytes.  Increased eosinophil percentage. Iron/TIBC/Ferritin/ %Sat  No results found for: IRON, TIBC, FERRITIN, IRONPCTSAT    RADIOGRAPHIC STUDIES: I have personally reviewed the radiological images as listed and agreed with the findings in the report. MM 3D SCREEN BREAST BILATERAL  Result Date: 08/06/2021 CLINICAL DATA:  Screening. EXAM: DIGITAL SCREENING BILATERAL MAMMOGRAM WITH TOMOSYNTHESIS AND CAD TECHNIQUE: Bilateral screening digital craniocaudal and mediolateral oblique mammograms were obtained. Bilateral screening digital breast tomosynthesis was performed. The images were evaluated with computer-aided detection. COMPARISON:  Previous exam(s). ACR Breast Density Category b: There are scattered areas of fibroglandular density. FINDINGS: There are no findings suspicious for malignancy. IMPRESSION: No mammographic evidence of malignancy. A result letter of this screening mammogram will be mailed directly to the patient. RECOMMENDATION: Screening mammogram in one year. (Code:SM-B-01Y) BI-RADS CATEGORY  1: Negative. Electronically Signed   By: Audie Pinto M.D.   On: 08/06/2021 10:11      ASSESSMENT & PLAN:  No diagnosis found.  Thrombocytopenia- per patient, she has had thrombocytopenia  while off methotrexate. Will check labs today to evaluate further. On 08/08/21, plt count was 93 at PCP and patient is not currently on methotrexate. Additional workup today as platelet count reportedly < 100,000: cbc w/ diff, cmp, pt, ptt, folate, iron studies, ferritin, b12, flow cytometry, blood smear, ldh. Recommendations based on results which I will discuss with patient by phone.   Vitamin B deficiency- B12 previously borderline low at 230. Now on oral b12 1000 mcg daily. Will check level today.   Leukopenia- previously negative workup. Will check cbc today to evaluate further.   Rheumatoid arthritis- currently off methotrexate. Per patient, rheumatology wants to rechallenge at lower dose. She is currently off NSAIDS and just taking otc tylenol.   All questions were answered. The patient knows to call the clinic with any problems questions or concerns.  Return of visit:   Beckey Rutter, DNP, AGNP-C [place] at [facility] [phone] (clinic) 08/19/2021  cc Kirk Ruths, MD

## 2021-08-20 LAB — PROTEIN ELECTROPHORESIS, SERUM, WITH REFLEX
A/G Ratio: 1.3 (ref 0.7–1.7)
Albumin ELP: 3.8 g/dL (ref 2.9–4.4)
Alpha-1-Globulin: 0.3 g/dL (ref 0.0–0.4)
Alpha-2-Globulin: 0.5 g/dL (ref 0.4–1.0)
Beta Globulin: 1 g/dL (ref 0.7–1.3)
Gamma Globulin: 1.2 g/dL (ref 0.4–1.8)
Globulin, Total: 3 g/dL (ref 2.2–3.9)
Total Protein ELP: 6.8 g/dL (ref 6.0–8.5)

## 2021-08-21 LAB — COMP PANEL: LEUKEMIA/LYMPHOMA

## 2021-08-26 ENCOUNTER — Ambulatory Visit: Payer: Medicare HMO

## 2021-08-26 ENCOUNTER — Telehealth: Payer: Self-pay | Admitting: Pulmonary Disease

## 2021-08-26 DIAGNOSIS — M0510 Rheumatoid lung disease with rheumatoid arthritis of unspecified site: Secondary | ICD-10-CM

## 2021-08-26 NOTE — Telephone Encounter (Signed)
CXR ordered. Patient is aware and voiced her understanding.  Nothing further needed.

## 2021-08-26 NOTE — Telephone Encounter (Signed)
Spoke to patient.  Patient stated that she typically has yearly CXR.  She would like CXR prior to 09/18/2021 visit. Last OV note stated that she will need a CXR in 2023.   Dr. Halford Chessman, please advise if okay to order prior to OV? Thanks

## 2021-08-26 NOTE — Telephone Encounter (Signed)
Please place order for chest xray.  Can use diagnosis code for rheumatoid lung disease.

## 2021-09-02 ENCOUNTER — Telehealth: Payer: Self-pay | Admitting: Nurse Practitioner

## 2021-09-02 NOTE — Telephone Encounter (Signed)
Spoke to patient by phone. Workup was unremarkable or negative. Previously reported blood abnormalities likely secondary to methotrexate. She can follow up at [facility] as needed but no follow up needed at this time.

## 2021-09-18 ENCOUNTER — Ambulatory Visit: Payer: Medicare HMO | Admitting: Pulmonary Disease

## 2021-09-18 ENCOUNTER — Other Ambulatory Visit: Payer: Self-pay

## 2021-09-18 ENCOUNTER — Ambulatory Visit (INDEPENDENT_AMBULATORY_CARE_PROVIDER_SITE_OTHER): Payer: Medicare HMO

## 2021-09-18 ENCOUNTER — Encounter: Payer: Self-pay | Admitting: Pulmonary Disease

## 2021-09-18 VITALS — BP 112/54 | HR 76 | Temp 98.3°F | Ht 67.0 in | Wt 157.8 lb

## 2021-09-18 DIAGNOSIS — M0510 Rheumatoid lung disease with rheumatoid arthritis of unspecified site: Secondary | ICD-10-CM

## 2021-09-18 NOTE — Patient Instructions (Signed)
Follow up in 1 year with a chest xray

## 2021-09-18 NOTE — Progress Notes (Signed)
[place] [facility]  Chief Complaint  Patient presents with   Follow-up    Follow up. Patient says everything with her lungs are going okay.     Constitutional:  BP (!) 112/54 (BP Location: Right Arm, Patient Position: Sitting, Cuff Size: Normal)    Pulse 76    Temp 98.3 F (36.8 C) (Oral)    Ht 5\' 7"  (1.702 m)    Wt 157 lb 12.8 oz (71.6 kg)    LMP  (LMP Unknown)    SpO2 96%    BMI 24.71 kg/m   Past Medical History:  Endometriosis, HTN, HLD, RA, RBBB, Anxiety  Past Surgical History:  She  has a past surgical history that includes Abdominal hysterectomy (1991); Hemorrhoid surgery; Hand surgery; Foot surgery; Breast surgery (1988); Bartholin gland cyst excision (1980); Oophorectomy; Colonoscopy; Colonoscopy (N/A, 08/24/2013); Olecranon bursectomy (Right, 11/29/2013); Tenosynovectomy (Right, 11/29/2013); Foot surgery (Left); Weil osteotomy (Right, 08/10/2018); Colonoscopy with propofol (N/A, 09/09/2020); and Esophagogastroduodenoscopy (egd) with propofol (N/A, 09/09/2020).  Brief Summary:  Bethany Grant is a 81 y.o. female former smoker with lung nodules, family history of lung cancer, and history of rheumatoid arthritis.      Subjective:   She had to come of RA meds due to low blood counts.  Recently started back on lower dose of MTX.  Has lab work next week, and then might be transitioned to enbrel if she can get approval.  Has runny nose and allergies over the past couple of weeks.  Started on azelastine and this has helped.  Not having cough, wheeze, chest pain, or dyspnea.  Physical Exam:   Appearance - well kempt   ENMT - no sinus tenderness, no oral exudate, no LAN, Mallampati 2 airway, no stridor  Respiratory - equal breath sounds bilaterally, no wheezing or rales  CV - s1s2 regular rate and rhythm, no murmurs  Ext - changes of RA in her hands  Skin - no rashes  Psych - normal mood and affect   Pulmonary testing:  Bronchoscopy Oct  2009>>Pneumatocyte atypia suggestive of obstructive pneumonitis, can be seen with RA in the lungs PFT 06/19/08 >> FEV1 2.58(107%), FEV1% 71, TLC 6.07(111%), DLCO 96%, no BD  Chest Imaging:  CT chest 01/25/08 >> 4 mm Lt lower lobe nodule, hazy reticular infiltrate LLL CT chest 04/18/08 >> increase in LLL reticular infiltrate CT chest 05/23/09 >> 3.8 x 3.6 cm ascending aorta, scoliosis, decreased size LLL nodule, reticular changes LLL decreased Coronary CT chest 08/05/20 >> bullous disease in Lt lung base, b/l mild peripheral GGO CT angio chest 04/24/21 >> atherosclerosis, mild emphysema, stable RUL nodule since 2010, bullous scar LLL  Cardiac Tests:  Echo 08/06/20 >> EF 60 to 65%  Social History:  She  reports that she quit smoking about 17 years ago. Her smoking use included cigarettes. She has a 24.00 pack-year smoking history. She has never used smokeless tobacco. She reports that she does not drink alcohol and does not use drugs.  Family History:  Her family history includes Cancer in her brother, mother, sister, and sister; Hypertension in her sister.     Assessment/Plan:   History of tobacco abuse, and family history of lung cancer with prior imaging studies showing reticulonodular infiltrates in setting of rheumatoid arthritis. - will call her with results of chest xray from today; if stable, then she needs follow up CXR in 2024  Rheumatoid arthritis.  - she will follow up with Dr. Amil Amen with rheumatology  Time Spent Involved in Patient Care on Day of Examination:  26 minutes  Follow up:   Patient Instructions  Follow up in 1 year with a chest xray  Medication List:   Allergies as of 09/18/2021       Reactions   Other Nausea And Vomiting, Other (See Comments)   Doesn't do well with pain  Medications, sensitivity to tape   Hydrocodone-acetaminophen Nausea And Vomiting, Other (See Comments)        Medication List        Accurate as of September 18, 2021  3:44 PM.  If you have any questions, ask your nurse or doctor.          ALPRAZolam 0.25 MG tablet Commonly known as: XANAX Take 0.25 mg by mouth at bedtime.   aspirin EC 81 MG tablet Take 1 tablet (81 mg total) by mouth daily. Swallow whole.   ciclopirox 0.77 % cream Commonly known as: LOPROX as needed.   cycloSPORINE 0.05 % ophthalmic emulsion Commonly known as: RESTASIS Place 1 drop into both eyes 2 (two) times daily.   dicyclomine 10 MG capsule Commonly known as: BENTYL Take 10 mg by mouth 4 (four) times daily -  before meals and at bedtime.   famotidine 20 MG tablet Commonly known as: PEPCID Take 20 mg by mouth 2 (two) times daily as needed.   fluticasone 50 MCG/ACT nasal spray Commonly known as: FLONASE Place 1 spray into both nostrils daily as needed for allergies or rhinitis.   folic acid 1 MG tablet Commonly known as: FOLVITE Take 1 mg by mouth daily.   hydroxychloroquine 200 MG tablet Commonly known as: PLAQUENIL Take 400 mg by mouth daily.   hydrOXYzine 25 MG tablet Commonly known as: ATARAX Take 25-50 mg by mouth at bedtime as needed.   ibuprofen 200 MG tablet Commonly known as: ADVIL Take 200 mg by mouth every 6 (six) hours as needed for headache or moderate pain.   levocetirizine 5 MG tablet Commonly known as: XYZAL Take 5 mg by mouth every evening.   loratadine 10 MG tablet Commonly known as: CLARITIN Take 10 mg by mouth as needed.   losartan 50 MG tablet Commonly known as: COZAAR Take 1 tablet (50 mg total) by mouth daily.   methotrexate 2.5 MG tablet Commonly known as: RHEUMATREX Take 2.5 mg by mouth See admin instructions. Take 7.5 mg by mouth daily on Friday and take 7.5 mg by mouth daily on Saturday.   mupirocin ointment 2 % Commonly known as: Bactroban Apply 1 application topically daily.   nitroGLYCERIN 0.4 MG SL tablet Commonly known as: NITROSTAT Place 1 tablet (0.4 mg total) under the tongue every 5 (five) minutes as needed for  chest pain.   ondansetron 4 MG disintegrating tablet Commonly known as: Zofran ODT 4mg  ODT q4 hours prn nausea/vomit   pantoprazole 40 MG tablet Commonly known as: PROTONIX Take 40 mg by mouth daily.   rosuvastatin 10 MG tablet Commonly known as: CRESTOR TAKE 1 TABLET BY MOUTH EVERY DAY   vitamin B-12 1000 MCG tablet Commonly known as: CYANOCOBALAMIN Take 1 tablet (1,000 mcg total) by mouth daily.   vitamin C 100 MG tablet Take 100 mg by mouth daily.   Vitamin D3 25 MCG (1000 UT) Caps Take 1,000 Units by mouth daily.        Signature:  Chesley Mires, MD [place] Pager - [phone] 09/18/2021, 3:44 PM

## 2021-09-22 ENCOUNTER — Telehealth: Payer: Self-pay | Admitting: Pulmonary Disease

## 2021-09-22 DIAGNOSIS — R911 Solitary pulmonary nodule: Secondary | ICD-10-CM

## 2021-09-22 NOTE — Telephone Encounter (Signed)
Chest xray 09/18/21 - diffuse interstitial thickening, fibrotic changes at bases, focal nodularity in Rt upper lung   Results d/w pt.  Will schedule CT chest without contrast to further assess possible Rt upper lung nodule.

## 2021-09-29 ENCOUNTER — Other Ambulatory Visit: Payer: Self-pay

## 2021-09-29 ENCOUNTER — Ambulatory Visit (INDEPENDENT_AMBULATORY_CARE_PROVIDER_SITE_OTHER)
Admission: RE | Admit: 2021-09-29 | Discharge: 2021-09-29 | Disposition: A | Discharge status: Home / Self Care | Payer: Medicare HMO | Source: Ambulatory Visit | Attending: Pulmonary Disease | Admitting: Pulmonary Disease

## 2021-09-29 DIAGNOSIS — R911 Solitary pulmonary nodule: Secondary | ICD-10-CM

## 2021-09-30 ENCOUNTER — Telehealth: Payer: Self-pay | Admitting: Pulmonary Disease

## 2021-10-01 ENCOUNTER — Telehealth: Payer: Self-pay | Admitting: Pulmonary Disease

## 2021-10-01 DIAGNOSIS — R918 Other nonspecific abnormal finding of lung field: Secondary | ICD-10-CM

## 2021-10-01 NOTE — Telephone Encounter (Signed)
October 01, 2021 ?Bethany Mires, MD ?  ?  10:45 AM ?Note ?CT chest 09/29/21 >> atherosclerosis; apical scarring; Rt apical nodule 10x13x10 mm mildly increased since October 2010, RUL nodule 5x4x4 mm mildly increased since October 2010, 3 mm nodule RLL stable, 3x4 mm nodule LUL stable, 10x8x11 LLL nodule stable; centrilobular and paraseptal emphysema, LLL bulla, mild subpleural reticulation; thoracic scoliosis ?  ?  ?Reviewed different options regarding next steps: ?1) PET scan now and then consider biopsy if there is significant uptake.  Likelihood of significant metabolic activity probably low given the lesions have only mild change in 13 years. ?2) Radiographic follow up with non contrast CT in September 2023.  Since these seem to be very slow growing lesions, continuing radiographic monitoring would likely not change therapeutic options but we might be able to avoid unnecessary procedures if the lesions remain stable. ?  ?She is in agreement to continue radiographic follow up with non contrast CT chest in September 2023.  ?  ? ?

## 2021-10-01 NOTE — Telephone Encounter (Signed)
I called the patient and left a message that we would call her with the results.  ? ?Dr. Halford Chessman Please advise,  ?

## 2021-10-01 NOTE — Telephone Encounter (Signed)
CT chest 09/29/21 >> atherosclerosis; apical scarring; Rt apical nodule 10x13x10 mm mildly increased since October 2010, RUL nodule 5x4x4 mm mildly increased since October 2010, 3 mm nodule RLL stable, 3x4 mm nodule LUL stable, 10x8x11 LLL nodule stable; centrilobular and paraseptal emphysema, LLL bulla, mild subpleural reticulation; thoracic scoliosis ? ? ?Reviewed different options regarding next steps: ?1) PET scan now and then consider biopsy if there is significant uptake.  Likelihood of significant metabolic activity probably low given the lesions have only mild change in 13 years. ?2) Radiographic follow up with non contrast CT in September 2023.  Since these seem to be very slow growing lesions, continuing radiographic monitoring would likely not change therapeutic options but we might be able to avoid unnecessary procedures if the lesions remain stable. ? ?She is in agreement to continue radiographic follow up with non contrast CT chest in September 2023. ?

## 2021-10-01 NOTE — Telephone Encounter (Signed)
I have already spoken with the patient about the results.  Please see my phone note from 10/01/21 at 10:28 AM. ?

## 2022-02-16 ENCOUNTER — Ambulatory Visit: Payer: Medicare HMO | Admitting: Oncology

## 2022-02-16 ENCOUNTER — Other Ambulatory Visit: Payer: Medicare HMO

## 2022-02-23 ENCOUNTER — Ambulatory Visit (INDEPENDENT_AMBULATORY_CARE_PROVIDER_SITE_OTHER): Payer: Medicare HMO | Admitting: Family Medicine

## 2022-02-23 ENCOUNTER — Encounter: Payer: Self-pay | Admitting: Family Medicine

## 2022-02-23 VITALS — BP 135/68 | HR 101 | Temp 98.0°F | Ht 67.0 in | Wt 154.2 lb

## 2022-02-23 DIAGNOSIS — E7800 Pure hypercholesterolemia, unspecified: Secondary | ICD-10-CM | POA: Diagnosis not present

## 2022-02-23 DIAGNOSIS — Z0000 Encounter for general adult medical examination without abnormal findings: Secondary | ICD-10-CM

## 2022-02-23 DIAGNOSIS — Z23 Encounter for immunization: Secondary | ICD-10-CM

## 2022-02-23 DIAGNOSIS — I10 Essential (primary) hypertension: Secondary | ICD-10-CM

## 2022-02-23 DIAGNOSIS — F5101 Primary insomnia: Secondary | ICD-10-CM

## 2022-02-23 DIAGNOSIS — Z79899 Other long term (current) drug therapy: Secondary | ICD-10-CM

## 2022-02-23 DIAGNOSIS — M069 Rheumatoid arthritis, unspecified: Secondary | ICD-10-CM

## 2022-02-23 LAB — CBC WITH DIFFERENTIAL/PLATELET
Basophils Absolute: 0 10*3/uL (ref 0.0–0.1)
Basophils Relative: 0.9 % (ref 0.0–3.0)
Eosinophils Absolute: 0.2 10*3/uL (ref 0.0–0.7)
Eosinophils Relative: 4.2 % (ref 0.0–5.0)
HCT: 42.4 % (ref 36.0–46.0)
Hemoglobin: 13.6 g/dL (ref 12.0–15.0)
Lymphocytes Relative: 22.7 % (ref 12.0–46.0)
Lymphs Abs: 1 10*3/uL (ref 0.7–4.0)
MCHC: 32 g/dL (ref 30.0–36.0)
MCV: 90 fl (ref 78.0–100.0)
Monocytes Absolute: 0.6 10*3/uL (ref 0.1–1.0)
Monocytes Relative: 13.3 % — ABNORMAL HIGH (ref 3.0–12.0)
Neutro Abs: 2.7 10*3/uL (ref 1.4–7.7)
Neutrophils Relative %: 58.9 % (ref 43.0–77.0)
Platelets: 111 10*3/uL — ABNORMAL LOW (ref 150.0–400.0)
RBC: 4.71 Mil/uL (ref 3.87–5.11)
RDW: 13.2 % (ref 11.5–15.5)
WBC: 4.5 10*3/uL (ref 4.0–10.5)

## 2022-02-23 LAB — COMPREHENSIVE METABOLIC PANEL
ALT: 15 U/L (ref 0–35)
AST: 25 U/L (ref 0–37)
Albumin: 4.5 g/dL (ref 3.5–5.2)
Alkaline Phosphatase: 95 U/L (ref 39–117)
BUN: 13 mg/dL (ref 6–23)
CO2: 30 mEq/L (ref 19–32)
Calcium: 9.5 mg/dL (ref 8.4–10.5)
Chloride: 102 mEq/L (ref 96–112)
Creatinine, Ser: 1.17 mg/dL (ref 0.40–1.20)
GFR: 44.63 mL/min — ABNORMAL LOW (ref >60.00)
Glucose, Bld: 89 mg/dL (ref 70–99)
Potassium: 5.1 mEq/L (ref 3.5–5.1)
Sodium: 140 mEq/L (ref 135–145)
Total Bilirubin: 0.6 mg/dL (ref 0.2–1.2)
Total Protein: 7.3 g/dL (ref 6.0–8.3)

## 2022-02-23 LAB — LIPID PANEL
Cholesterol: 115 mg/dL (ref 0–200)
HDL: 54 mg/dL (ref >39.00)
LDL Cholesterol: 49 mg/dL (ref 0–99)
NonHDL: 60.79
Total CHOL/HDL Ratio: 2
Triglycerides: 60 mg/dL (ref 0.0–149.0)
VLDL: 12 mg/dL (ref 0.0–40.0)

## 2022-02-23 MED ORDER — PANTOPRAZOLE SODIUM 40 MG PO TBEC: 40.0000 mg | DELAYED_RELEASE_TABLET | Freq: Every day | ORAL | 1 refills | Status: DC

## 2022-02-23 MED ORDER — TETANUS-DIPHTH-ACELL PERTUSSIS 5-2.5-18.5 LF-MCG/0.5 IM SUSP: 0.5000 mL | Freq: Once | INTRAMUSCULAR | 0 refills | Status: AC

## 2022-02-23 NOTE — Patient Instructions (Signed)
Start over the counter melatonin '5mg'$  nightly. If not helpful after 5 nights then increase to 10 mg nightly.

## 2022-02-23 NOTE — Progress Notes (Signed)
Office Note 02/23/2022  CC:  Chief Complaint  Patient presents with   Establish Care   HPI:  Bethany Grant is a 81 y.o. White female who is here to establish care and for annual cpe. Patient's most recent primary MD: Dr. Frazier Richards. Old records were reviewed prior to or during today's visit.  She is feeling well. No current arthritis flares. Says Enbrel has been helping pretty well for her RA, followed by Dr. Amil Amen.   Past Medical History:  Diagnosis Date   Anxiety    Emphysema lung ([place])    noted on CT scanning   GERD (gastroesophageal reflux disease)    Hypercholesterolemia    Hypertension    Insomnia    Osteopenia 09/2017   T score -2.1 FRAX 18% / 2.7%   Pulmonary nodule    followed by Dr. Halford Chessman   Rheumatoid arthritis(714.0)     Past Surgical History:  Procedure Laterality Date   ABDOMINAL HYSTERECTOMY  1991   TAH/BSO   BARTHOLIN GLAND CYST EXCISION  1980   RIGHT   BREAST SURGERY  1988   BREAST CYST   COLONOSCOPY N/A 08/24/2013   Procedure: COLONOSCOPY;  Surgeon: Rogene Houston, MD;  [facility];  Service: Endoscopy;  Laterality: N/A;  100   COLONOSCOPY WITH PROPOFOL N/A 09/09/2020   diverticulosis and hemorrhoids, o/w normal.  Procedure: COLONOSCOPY WITH PROPOFOL;  Surgeon: Toledo, Benay Pike, MD;  Location: [facility];  Service: Gastroenterology;  Laterality: N/A;   CT CORONARY CA SCORING  07/2020   29th%tile   ESOPHAGOGASTRODUODENOSCOPY (EGD) WITH PROPOFOL N/A 09/09/2020   gastritis and erythematous duodenopathy. Procedure: ESOPHAGOGASTRODUODENOSCOPY (EGD) WITH PROPOFOL;  Surgeon: Toledo, Benay Pike, MD;  Location: [facility];  Service: Gastroenterology;  Laterality: N/A;   FOOT SURGERY     2 TIMES   FOOT SURGERY Left    HAND SURGERY     HEMORRHOID SURGERY     been over 20 years ago   OLECRANON BURSECTOMY Right 11/29/2013   Procedure: EXCISION OLECRANON BURSA RIGHT ;  Surgeon: Wynonia Sours, MD;  Location: [place];  Service: Orthopedics;  Laterality: Right;   OOPHORECTOMY     BSO   TENOSYNOVECTOMY Right 11/29/2013   Procedure: TENOSYNOVECTOMY FLEXOR TENDON RIGHT WRIST ;  Surgeon: Wynonia Sours, MD;  Location: [place];  Service: Orthopedics;  Laterality: Right;   WEIL OSTEOTOMY Right 08/10/2018   Procedure: WEIL OSTEOTOMY 2, 3,4 METATARSAL RIGHT FOOT, PROXIMAL INTERPHALANGEAL RESECTION TOES 2, 3, 4;  Surgeon: Newt Minion, MD;  Location: [place];  Service: Orthopedics;  Laterality: Right;    Family History  Problem Relation Age of Onset   Cancer Mother        LUNG   Hypertension Sister    Cancer Sister        LUNG, THYROID, UTERINE   Cancer Brother        LUNG   Cancer Sister        Lung cancer   Colon cancer Neg Hx     Social History   Socioeconomic History   Marital status: Married    Spouse name: Not on file   Number of children: Not on file   Years of education: Not on file   Highest education level: Not on file  Occupational History   Not on file  Tobacco Use   Smoking status: Former    Packs/day: 0.80    Years: 30.00    Total pack years:  24.00    Types: Cigarettes    Quit date: 07/27/2004    Years since quitting: 17.5   Smokeless tobacco: Never  Vaping Use   Vaping Use: Never used  Substance and Sexual Activity   Alcohol use: No    Alcohol/week: 0.0 standard drinks of alcohol   Drug use: No   Sexual activity: Not Currently    Birth control/protection: Surgical    Comment: 1st intercourse 81 yo-Fewer than 5 partners  Other Topics Concern   Not on file  Social History Narrative   Married   Educ: HS   Occup:   24 pack-yr smoking hx, quit approx 2005   No alc   Social Determinants of Health   Financial Resource Strain: Not on file  Food Insecurity: Not on file  Transportation Needs: Not on file  Physical Activity: Not on file  Stress: Not on file  Social Connections: Not on file  Intimate Partner Violence: Not on file     Outpatient Encounter Medications as of 02/23/2022  Medication Sig   Ascorbic Acid (VITAMIN C PO) Take 500 mg by mouth daily.   aspirin EC 81 MG tablet Take 1 tablet (81 mg total) by mouth daily. Swallow whole.   Cholecalciferol (VITAMIN D3) 25 MCG (1000 UT) CAPS Take 1,000 Units by mouth daily.   ciclopirox (LOPROX) 0.77 % cream as needed.   Cyanocobalamin (VITAMIN B-12 PO) Take 1,000 mg by mouth daily.   cycloSPORINE (RESTASIS) 0.05 % ophthalmic emulsion Place 1 drop into both eyes 2 (two) times daily.   dicyclomine (BENTYL) 10 MG capsule Take 10 mg by mouth 4 (four) times daily -  before meals and at bedtime.   etanercept (ENBREL SURECLICK) 50 MG/ML injection    fluticasone (FLONASE) 50 MCG/ACT nasal spray Place 1 spray into both nostrils daily as needed for allergies or rhinitis.   hydrOXYzine (ATARAX/VISTARIL) 25 MG tablet Take 25-50 mg by mouth at bedtime as needed.   ibuprofen (ADVIL,MOTRIN) 200 MG tablet Take 200 mg by mouth every 6 (six) hours as needed for headache or moderate pain.   levocetirizine (XYZAL) 5 MG tablet Take 5 mg by mouth every evening.   losartan (COZAAR) 50 MG tablet Take 1 tablet (50 mg total) by mouth daily.   mupirocin ointment (BACTROBAN) 2 % Apply 1 application topically daily.   Naproxen Sodium (ALEVE PO) Take 220 mg by mouth at bedtime.   rosuvastatin (CRESTOR) 10 MG tablet TAKE 1 TABLET BY MOUTH EVERY DAY   vitamin B-12 (CYANOCOBALAMIN) 1000 MCG tablet Take 1 tablet (1,000 mcg total) by mouth daily.   [DISCONTINUED] pantoprazole (PROTONIX) 40 MG tablet Take 40 mg by mouth daily.   pantoprazole (PROTONIX) 40 MG tablet Take 1 tablet (40 mg total) by mouth daily.   [DISCONTINUED] ALPRAZolam (XANAX) 0.25 MG tablet Take 0.25 mg by mouth at bedtime. (Patient not taking: Reported on 02/23/2022)   [DISCONTINUED] Ascorbic Acid (VITAMIN C) 100 MG tablet Take 100 mg by mouth daily.   [DISCONTINUED] famotidine (PEPCID) 20 MG tablet Take 20 mg by mouth 2 (two) times  daily as needed. (Patient not taking: Reported on 09/18/2021)   [DISCONTINUED] folic acid (FOLVITE) 1 MG tablet Take 1 mg by mouth daily.   [DISCONTINUED] hydroxychloroquine (PLAQUENIL) 200 MG tablet Take 400 mg by mouth daily. (Patient not taking: Reported on 09/18/2021)   [DISCONTINUED] loratadine (CLARITIN) 10 MG tablet Take 10 mg by mouth as needed. (Patient not taking: Reported on 02/23/2022)   [DISCONTINUED] methotrexate (RHEUMATREX) 2.5 MG tablet  Take 2.5 mg by mouth See admin instructions. Take 7.5 mg by mouth daily on Friday and take 7.5 mg by mouth daily on Saturday. (Patient not taking: Reported on 02/23/2022)   [DISCONTINUED] traZODone (DESYREL) 50 MG tablet Take 50 mg by mouth daily. (Patient not taking: Reported on 02/23/2022)   No facility-administered encounter medications on file as of 02/23/2022.    Allergies  Allergen Reactions   Other Nausea And Vomiting and Other (See Comments)    Doesn't do well with pain  Medications, sensitivity to tape   Hydrocodone-Acetaminophen Nausea And Vomiting and Other (See Comments)     Review of Systems  Constitutional:  Negative for appetite change, chills, fatigue and fever.  HENT:  Negative for congestion, dental problem, ear pain and sore throat.   Eyes:  Negative for discharge, redness and visual disturbance.  Respiratory:  Negative for cough, chest tightness, shortness of breath and wheezing.   Cardiovascular:  Negative for chest pain, palpitations and leg swelling.  Gastrointestinal:  Negative for abdominal pain, blood in stool, diarrhea, nausea and vomiting.  Genitourinary:  Negative for difficulty urinating, dysuria, flank pain, frequency, hematuria and urgency.  Musculoskeletal:  Negative for arthralgias, back pain, joint swelling, myalgias and neck stiffness.  Skin:  Negative for pallor and rash.  Neurological:  Negative for dizziness, speech difficulty, weakness and headaches.  Hematological:  Negative for adenopathy. Does not  bruise/bleed easily.  Psychiatric/Behavioral:  Negative for confusion and sleep disturbance. The patient is not nervous/anxious.     PE; Blood pressure 135/68, pulse (!) 101, temperature 98 F (36.7 C), height '5\' 7"'$  (1.702 m), weight 154 lb 3.2 oz (69.9 kg), SpO2 98 %.Body mass index is 24.15 kg/m.   Physical Exam Exam chaperoned by Deveron Furlong, CMA. Gen: Alert, well appearing.  Patient is oriented to person, place, time, and situation. AFFECT: pleasant, lucid thought and speech. ENT: Ears: EACs clear, normal epithelium.  TMs with good light reflex and landmarks bilaterally.  Eyes: no injection, icteris, swelling, or exudate.  EOMI, PERRLA. Nose: no drainage or turbinate edema/swelling.  No injection or focal lesion.  Mouth: lips without lesion/swelling.  Oral mucosa pink and moist.  Dentition intact and without obvious caries or gingival swelling.  Oropharynx without erythema, exudate, or swelling.  Neck: supple/nontender.  No LAD, mass, or TM.  Carotid pulses 2+ bilaterally, without bruits. CV: RRR, no m/r/g.   LUNGS: CTA bilat, nonlabored resps, good aeration in all lung fields. ABD: soft, NT, ND, BS normal.  No hepatospenomegaly or mass.  No bruits. EXT: no clubbing, cyanosis, or edema.  Musculoskeletal: no joint swelling, erythema, warmth, or tenderness.  Both hands with RA deformities, left greater than right.  No acute synovitis changes. Skin - no sores or suspicious lesions or rashes or color changes  Pertinent labs:  Last CBC Lab Results  Component Value Date   WBC 5.1 08/19/2021   HGB 13.6 08/19/2021   HCT 43.6 08/19/2021   MCV 90.6 08/19/2021   MCH 28.3 08/19/2021   RDW 13.3 08/19/2021   PLT 153 81/07/7508   Last metabolic panel Lab Results  Component Value Date   GLUCOSE 93 08/19/2021   NA 138 08/19/2021   K 4.5 08/19/2021   CL 99 08/19/2021   CO2 29 08/19/2021   BUN 17 08/19/2021   CREATININE 0.98 08/19/2021   GFRNONAA 59 (L) 08/19/2021   CALCIUM 9.3  08/19/2021   PROT 7.6 08/19/2021   ALBUMIN 4.2 08/19/2021   LABGLOB 3.0 08/19/2021   AGRATIO 1.3  08/19/2021   BILITOT 0.5 08/19/2021   ALKPHOS 126 08/19/2021   AST 24 08/19/2021   ALT 15 08/19/2021   ANIONGAP 10 08/19/2021   Last lipids Lab Results  Component Value Date   CHOL 96 (L) 11/25/2020   HDL 44 11/25/2020   LDLCALC 39 11/25/2020   TRIG 51 11/25/2020   CHOLHDL 2.2 11/25/2020    Last thyroid functions Lab Results  Component Value Date   TSH 2.128 06/26/2020   Last vitamin D Lab Results  Component Value Date   VD25OH 46 07/01/2015   Last vitamin B12 and Folate Lab Results  Component Value Date   QXIHWTUU82 800 08/19/2021   FOLATE 66.0 08/19/2021    ASSESSMENT AND PLAN:   New patient, establishing care.  Health maintenance exam: Reviewed age and gender appropriate health maintenance issues (prudent diet, regular exercise, health risks of tobacco and excessive alcohol, use of seatbelts, fire alarms in home, use of sunscreen).  Also reviewed age and gender appropriate health screening as well as vaccine recommendations. Vaccines: Prevnar 20->given today.  Tdap->rx to pharmacy. Labs: cbc, cmet, flp Cervical ca screening: History of hysterectomy for benign diagnosis. Breast ca screening: Next mammography due 07/2022. Colon ca screening: Colonoscopy 09/09/2022, no polyps.  No further screening indicated.  Insomnia--psychophysiologic. Recommended trial of melatonin 5 to 10 mg.  An After Visit Summary was printed and given to the patient.  No follow-ups on file.  Signed:  Crissie Sickles, MD           02/23/2022

## 2022-02-24 ENCOUNTER — Encounter: Payer: Self-pay | Admitting: Family Medicine

## 2022-02-24 ENCOUNTER — Telehealth: Payer: Self-pay

## 2022-02-24 NOTE — Telephone Encounter (Signed)
Patient returning call about lab results. Please call when available.

## 2022-02-24 NOTE — Telephone Encounter (Signed)
Spoke with pt regarding results, advised.

## 2022-02-28 ENCOUNTER — Telehealth: Payer: Self-pay

## 2022-02-28 NOTE — Telephone Encounter (Signed)
Spoke to patient to schedule medicare well visit.  She declined at this time. Please do not call.

## 2022-03-03 NOTE — Progress Notes (Unsigned)
Office Visit    Patient Name: Bethany Grant Date of Encounter: 03/04/2022  PCP:  Tammi Sou, MD   [place]  Cardiologist:  Candee Furbish, MD  Advanced Practice Provider:  No care team member to display Electrophysiologist:  None   HPI    Bethany Grant is a 81 y.o. female with a past medical history significant for hypertension, hyperlipidemia and CAD presents today for annual follow-up.   She recently changed her PCP and was asking for [place] to refill her losartan 50 mg daily.  She had no new complaints when she was seen 11/27/2020.  Today, she states she is doing pretty well without any chest pain or shortness of breath.  She has not had any dizziness, lightheadedness, syncope, presyncope.  She walks 3 to 4 miles a week.  She is wanting to get some refills on her medications today.  She states her blood pressure is usually not that low.  She is currently asymptomatic.  We have asked her to take her blood pressure for a week and send [place] those values for the potential need for medication adjustment.  Otherwise, doing okay from a CV standpoint.  Reports no shortness of breath nor dyspnea on exertion. Reports no chest pain, pressure, or tightness. No edema, orthopnea, PND. Reports no palpitations.    Past Medical History    Past Medical History:  Diagnosis Date   Anxiety    Emphysema lung ([place])    noted on CT scanning   GERD (gastroesophageal reflux disease)    Hypercholesterolemia    Hypertension    Insomnia    Osteopenia 09/2017   T score -2.1 FRAX 23% and 8.3% (has RA).   Pulmonary nodule    followed by Dr. Halford Chessman   Rheumatoid arthritis(714.0)    Thrombocytopenia ([place])    mild, chronic   Past Surgical History:  Procedure Laterality Date   ABDOMINAL HYSTERECTOMY  1991   TAH/BSO   BARTHOLIN GLAND CYST EXCISION  1980   RIGHT   BREAST SURGERY  1988   BREAST CYST   COLONOSCOPY N/A 08/24/2013   Procedure: COLONOSCOPY;  Surgeon: Rogene Houston, MD;  [facility];  Service: Endoscopy;  Laterality: N/A;  100   COLONOSCOPY WITH PROPOFOL N/A 09/09/2020   diverticulosis and hemorrhoids, o/w normal.  Procedure: COLONOSCOPY WITH PROPOFOL;  Surgeon: Toledo, Benay Pike, MD;  Location: [facility];  Service: Gastroenterology;  Laterality: N/A;   CT CORONARY CA SCORING  07/2020   29th%tile   ESOPHAGOGASTRODUODENOSCOPY (EGD) WITH PROPOFOL N/A 09/09/2020   gastritis and erythematous duodenopathy. Procedure: ESOPHAGOGASTRODUODENOSCOPY (EGD) WITH PROPOFOL;  Surgeon: Toledo, Benay Pike, MD;  Location: [facility];  Service: Gastroenterology;  Laterality: N/A;   FOOT SURGERY     2 TIMES   FOOT SURGERY Left    HAND SURGERY     HEMORRHOID SURGERY     been over 20 years ago   OLECRANON BURSECTOMY Right 11/29/2013   Procedure: EXCISION OLECRANON BURSA RIGHT ;  Surgeon: Wynonia Sours, MD;  Location: [place];  Service: Orthopedics;  Laterality: Right;   OOPHORECTOMY     BSO   TENOSYNOVECTOMY Right 11/29/2013   Procedure: TENOSYNOVECTOMY FLEXOR TENDON RIGHT WRIST ;  Surgeon: Wynonia Sours, MD;  Location: [place];  Service: Orthopedics;  Laterality: Right;   WEIL OSTEOTOMY Right 08/10/2018   Procedure: WEIL OSTEOTOMY 2, 3,4 METATARSAL RIGHT FOOT, PROXIMAL INTERPHALANGEAL RESECTION TOES 2, 3, 4;  Surgeon: Newt Minion, MD;  Location: [place];  Service: Orthopedics;  Laterality: Right;    Allergies  Allergies  Allergen Reactions   Other Nausea And Vomiting and Other (See Comments)    Doesn't do well with pain  Medications, sensitivity to tape   Hydrocodone-Acetaminophen Nausea And Vomiting and Other (See Comments)     EKGs/Labs/Other Studies Reviewed:   The following studies were reviewed today:  Echocardiogram 08/06/20  IMPRESSIONS     1. Left ventricular ejection fraction, by estimation, is 60 to 65%. Left  ventricular ejection fraction by 3D volume is 63 %. The left ventricle has   normal function. The left ventricle has no regional wall motion  abnormalities. Left ventricular diastolic   parameters were normal. The average left ventricular global longitudinal  strain is -23.5 %. The global longitudinal strain is normal.   2. Right ventricular systolic function is normal. The right ventricular  size is normal. There is normal pulmonary artery systolic pressure. The  estimated right ventricular systolic pressure is 52.8 mmHg.   3. The mitral valve is grossly normal. Trivial mitral valve  regurgitation. No evidence of mitral stenosis.   4. The aortic valve is tricuspid. There is mild calcification of the  aortic valve. Aortic valve regurgitation is not visualized. No aortic  stenosis is present.   5. The inferior vena cava is normal in size with greater than 50%  respiratory variability, suggesting right atrial pressure of 3 mmHg.   Conclusion(s)/Recommendation(s): Normal biventricular function without  evidence of hemodynamically significant valvular heart disease.   FINDINGS   Left Ventricle: Left ventricular ejection fraction, by estimation, is 60  to 65%. Left ventricular ejection fraction by 3D volume is 63 %. The left  ventricle has normal function. The left ventricle has no regional wall  motion abnormalities. The average  left ventricular global longitudinal strain is -23.5 %. The global  longitudinal strain is normal. The left ventricular internal cavity size  was normal in size. There is no left ventricular hypertrophy. Left  ventricular diastolic parameters were normal.   Right Ventricle: The right ventricular size is normal. No increase in  right ventricular wall thickness. Right ventricular systolic function is  normal. There is normal pulmonary artery systolic pressure. The tricuspid  regurgitant velocity is 2.31 m/s, and   with an assumed right atrial pressure of 3 mmHg, the estimated right  ventricular systolic pressure is 41.3 mmHg.   Left  Atrium: Left atrial size was normal in size.   Right Atrium: Right atrial size was normal in size.   Pericardium: Trivial pericardial effusion is present. Presence of  pericardial fat pad.   Mitral Valve: The mitral valve is grossly normal. Trivial mitral valve  regurgitation. No evidence of mitral valve stenosis.   Tricuspid Valve: The tricuspid valve is grossly normal. Tricuspid valve  regurgitation is trivial. No evidence of tricuspid stenosis.   Aortic Valve: The aortic valve is tricuspid. There is mild calcification  of the aortic valve. Aortic valve regurgitation is not visualized. No  aortic stenosis is present.   Pulmonic Valve: The pulmonic valve was grossly normal. Pulmonic valve  regurgitation is not visualized. No evidence of pulmonic stenosis.   Aorta: The aortic root and ascending aorta are structurally normal, with  no evidence of dilitation.   Venous: The inferior vena cava is normal in size with greater than 50%  respiratory variability, suggesting right atrial pressure of 3 mmHg.   IAS/Shunts: The atrial septum is grossly normal.  EKG:  EKG is  ordered today.  The ekg ordered today demonstrates sinus bradycardia, 58 bpm, right bundle branch block (old), bifascicular block (old)  Recent Labs: 02/23/2022: ALT 15; BUN 13; Creatinine, Ser 1.17; Hemoglobin 13.6; Platelets 111.0; Potassium 5.1; Sodium 140  Recent Lipid Panel    Component Value Date/Time   CHOL 115 02/23/2022 1113   CHOL 96 (L) 11/25/2020 0905   TRIG 60.0 02/23/2022 1113   HDL 54.00 02/23/2022 1113   HDL 44 11/25/2020 0905   CHOLHDL 2 02/23/2022 1113   VLDL 12.0 02/23/2022 1113   LDLCALC 49 02/23/2022 1113   LDLCALC 39 11/25/2020 0905   Home Medications   Current Meds  Medication Sig   Ascorbic Acid (VITAMIN C PO) Take 500 mg by mouth daily.   aspirin EC 81 MG tablet Take 1 tablet (81 mg total) by mouth daily. Swallow whole.   Cholecalciferol (VITAMIN D3) 25 MCG (1000 UT) CAPS Take 1,000  Units by mouth daily.   ciclopirox (LOPROX) 0.77 % cream as needed.   Cyanocobalamin (VITAMIN B-12 PO) Take 1,000 mg by mouth daily.   cycloSPORINE (RESTASIS) 0.05 % ophthalmic emulsion Place 1 drop into both eyes 2 (two) times daily.   dicyclomine (BENTYL) 10 MG capsule Take 10 mg by mouth 4 (four) times daily -  before meals and at bedtime.   etanercept (ENBREL SURECLICK) 50 MG/ML injection    fluticasone (FLONASE) 50 MCG/ACT nasal spray Place 1 spray into both nostrils daily as needed for allergies or rhinitis.   hydrOXYzine (ATARAX/VISTARIL) 25 MG tablet Take 25-50 mg by mouth at bedtime as needed.   ibuprofen (ADVIL,MOTRIN) 200 MG tablet Take 200 mg by mouth every 6 (six) hours as needed for headache or moderate pain.   levocetirizine (XYZAL) 5 MG tablet Take 5 mg by mouth every evening.   mupirocin ointment (BACTROBAN) 2 % Apply 1 application topically daily.   Naproxen Sodium (ALEVE PO) Take 220 mg by mouth at bedtime.   pantoprazole (PROTONIX) 40 MG tablet Take 1 tablet (40 mg total) by mouth daily.   vitamin B-12 (CYANOCOBALAMIN) 1000 MCG tablet Take 1 tablet (1,000 mcg total) by mouth daily.   [DISCONTINUED] losartan (COZAAR) 50 MG tablet Take 1 tablet (50 mg total) by mouth daily.   [DISCONTINUED] rosuvastatin (CRESTOR) 10 MG tablet TAKE 1 TABLET BY MOUTH EVERY DAY     Review of Systems      All other systems reviewed and are otherwise negative except as noted above.  Physical Exam    VS:  BP 98/62   Pulse (!) 58   Ht '5\' 7"'$  (1.702 m)   Wt 157 lb (71.2 kg)   LMP  (LMP Unknown)   SpO2 97%   BMI 24.59 kg/m  , BMI Body mass index is 24.59 kg/m.  Wt Readings from Last 3 Encounters:  03/04/22 157 lb (71.2 kg)  02/23/22 154 lb 3.2 oz (69.9 kg)  09/18/21 157 lb 12.8 oz (71.6 kg)     GEN: Well nourished, well developed, in no acute distress. HEENT: normal. Neck: Supple, no JVD, carotid bruits, or masses. Cardiac: RRR, no murmurs, rubs, or gallops. No clubbing, cyanosis,  edema.  Radials/PT 2+ and equal bilaterally.  Respiratory:  Respirations regular and unlabored, clear to auscultation bilaterally. GI: Soft, nontender, nondistended. MS: No deformity or atrophy. Skin: Warm and dry, no rash. Neuro:  Strength and sensation are intact. Psych: Normal affect.  Assessment & Plan    CAD -No chest pain or shortness of  breath -Continue GDMT: ASA 81 mg, Cozaar 50 mg, Crestor 10 mg daily  HLD -Lipid panel (01/2022) HDL 54, LDL 49, triglycerides 60, total cholesterol 115 -Continue Crestor 10 mg daily  Bifascicular block -asymptomatic and stable  HTN -Borderline hypotension today -I asked the patient to keep track of her blood pressure over the next week and call us with those values -We discussed potentially decreasing her Cozaar if she remains low -Asymptomatic at this time         Disposition: Follow up 1 year with Candee Furbish, MD or APP.  Signed, Elgie Collard, PA-C 03/04/2022, 2:31 PM [facility]

## 2022-03-04 ENCOUNTER — Encounter: Payer: Self-pay | Admitting: Physician Assistant

## 2022-03-04 ENCOUNTER — Ambulatory Visit: Payer: Medicare HMO | Admitting: Physician Assistant

## 2022-03-04 VITALS — BP 98/62 | HR 58 | Ht 67.0 in | Wt 157.0 lb

## 2022-03-04 DIAGNOSIS — I10 Essential (primary) hypertension: Secondary | ICD-10-CM

## 2022-03-04 DIAGNOSIS — I4510 Unspecified right bundle-branch block: Secondary | ICD-10-CM | POA: Diagnosis not present

## 2022-03-04 DIAGNOSIS — E785 Hyperlipidemia, unspecified: Secondary | ICD-10-CM

## 2022-03-04 DIAGNOSIS — I2510 Atherosclerotic heart disease of native coronary artery without angina pectoris: Secondary | ICD-10-CM | POA: Diagnosis not present

## 2022-03-04 DIAGNOSIS — I444 Left anterior fascicular block: Secondary | ICD-10-CM | POA: Diagnosis not present

## 2022-03-04 MED ORDER — LOSARTAN POTASSIUM 50 MG PO TABS: 50.0000 mg | ORAL_TABLET | Freq: Every day | ORAL | 3 refills | Status: DC

## 2022-03-04 MED ORDER — ROSUVASTATIN CALCIUM 10 MG PO TABS: 10.0000 mg | ORAL_TABLET | Freq: Every day | ORAL | 3 refills | Status: DC

## 2022-03-04 NOTE — Patient Instructions (Signed)
Medication Instructions:  Your physician recommends that you continue on your current medications as directed. Please refer to the Current Medication list given to you today.  *If you need a refill on your cardiac medications before your next appointment, please call your pharmacy*   Lab Work: None If you have labs (blood work) drawn today and your tests are completely normal, you will receive your results only by: [place] (if you have MyChart) OR A paper copy in the mail If you have any lab test that is abnormal or we need to change your treatment, we will call you to review the results.   Follow-Up: At [facility], you and your health needs are our priority.  As part of our continuing mission to provide you with exceptional heart care, we have created designated Provider Care Teams.  These Care Teams include your primary Cardiologist (physician) and Advanced Practice Providers (APPs -  Physician Assistants and Nurse Practitioners) who all work together to provide you with the care you need, when you need it.    Your next appointment:   1 year(s)  The format for your next appointment:   In Person  Provider:   Candee Furbish, MD  or Nicholes Rough, PA-C     {  Other Instructions Your physician has requested that you regularly monitor and record your blood pressure and heart rate readings at home for one week. Please use the same machine at the same time of day to check your readings and record them and send them to [place] through your MyChart or call us.   Make sure to check 2 hours after your morning medications.    AVOID these things for 30 minutes before checking your blood pressure: No Drinking caffeine. No Drinking alcohol. No Eating. No Smoking. No Exercising.   Five minutes before checking your blood pressure: Pee. Sit in a dining chair. Avoid sitting in a soft couch or armchair. Be quiet. Do not talk   Important Information About Sugar

## 2022-03-12 ENCOUNTER — Telehealth: Payer: Self-pay | Admitting: Cardiology

## 2022-03-12 NOTE — Telephone Encounter (Signed)
Returned call to patient, she states BP readings reported were taken before she took her BP med. Advised patient to continue monitoring BP for the next week, checking BP at least 2 hours after taking losartan and call in those readings to our office. Patient verbalized understanding.  Will forward to Nicholes Rough, PA-C to review.

## 2022-03-12 NOTE — Telephone Encounter (Signed)
Pt c/o BP issue: STAT if pt c/o blurred vision, one-sided weakness or slurred speech  1. What are your last 5 BP readings?   141/69  hr63 150/65  hr60 145/63  hr67 148/75  hr68 120/63  hr64  All were before she started taking her medication   2. Are you having any other symptoms (ex. Dizziness, headache, blurred vision, passed out)?   3. What is your BP issue? Pt states she was told by Johann Capers, PA at last visit to call back with BP readings

## 2022-03-16 NOTE — Telephone Encounter (Signed)
Pt is sending in recent readings Nicholes Rough PA-C requested.   Will route this message to [facility] and covering [place], for further review and advisement.

## 2022-03-16 NOTE — Telephone Encounter (Signed)
Pt is calling with her bp readings as requested:  121/58 64 HR 136/53 60 HR 114/56 64 HR 151/63 53 HR

## 2022-03-17 NOTE — Telephone Encounter (Signed)
March 16, 2022 Elgie Collard, [place] to [place]      03/16/22  5:22 PM The new readings are much better. Continue to track over the next week. Thank you!   Elgie Collard, PA-C    Pt aware that per Johann Capers, her readings are so much better and she should continue tracking these for the next week.  Advised her to send some readings to [facility] thereafter, to ensure stability.  Pt verbalized understanding and agrees with his plan.

## 2022-03-31 ENCOUNTER — Telehealth: Payer: Self-pay

## 2022-03-31 NOTE — Telephone Encounter (Signed)
LVM for pt to CB and schedule Medicare Annual Wellness Visit (AWV).   ?

## 2022-04-09 ENCOUNTER — Ambulatory Visit (HOSPITAL_BASED_OUTPATIENT_CLINIC_OR_DEPARTMENT_OTHER)
Admission: RE | Admit: 2022-04-09 | Discharge: 2022-04-09 | Disposition: A | Discharge status: Home / Self Care | Payer: Medicare HMO | Source: Ambulatory Visit | Attending: Internal Medicine | Admitting: Internal Medicine

## 2022-04-09 ENCOUNTER — Encounter (HOSPITAL_BASED_OUTPATIENT_CLINIC_OR_DEPARTMENT_OTHER): Payer: Self-pay

## 2022-04-09 DIAGNOSIS — R918 Other nonspecific abnormal finding of lung field: Secondary | ICD-10-CM | POA: Insufficient documentation

## 2022-04-15 ENCOUNTER — Telehealth: Payer: Self-pay | Admitting: Pulmonary Disease

## 2022-04-15 DIAGNOSIS — R918 Other nonspecific abnormal finding of lung field: Secondary | ICD-10-CM

## 2022-04-15 NOTE — Telephone Encounter (Signed)
Patient would like the nurse or doctor to call regarding her CT results that she had last week.  Please call to discuss at [phone]

## 2022-04-15 NOTE — Telephone Encounter (Signed)
Called patient and she is wanting the result sof her CT scan that was done last week  Please advise sir

## 2022-04-17 NOTE — Telephone Encounter (Signed)
Please let her know her CT chest shows stable changes of emphysema and small lung nodules.  These haven't changed since September 2022.  She will need to have follow up CT chest in September 2024 for continued monitoring.

## 2022-04-17 NOTE — Telephone Encounter (Signed)
Called and spoke with pt letting her know the results of CT and she verbalized understanding. Nothing further needed.

## 2022-04-17 NOTE — Telephone Encounter (Signed)
Called to speak to pt but she was not at home. Had her spouse take a message for her to return call when she returned home so we could let her know the CT results and he verbalized understanding.

## 2022-04-30 ENCOUNTER — Other Ambulatory Visit: Payer: Self-pay | Admitting: Cardiology

## 2022-05-02 ENCOUNTER — Other Ambulatory Visit: Payer: Self-pay | Admitting: Cardiology

## 2022-08-03 ENCOUNTER — Other Ambulatory Visit (HOSPITAL_COMMUNITY): Payer: Self-pay | Admitting: Family Medicine

## 2022-08-03 DIAGNOSIS — Z1231 Encounter for screening mammogram for malignant neoplasm of breast: Secondary | ICD-10-CM

## 2022-08-12 ENCOUNTER — Ambulatory Visit (HOSPITAL_COMMUNITY)
Admission: RE | Admit: 2022-08-12 | Discharge: 2022-08-12 | Disposition: A | Discharge status: Home / Self Care | Payer: Medicare HMO | Source: Ambulatory Visit | Attending: Family Medicine | Admitting: Family Medicine

## 2022-08-12 DIAGNOSIS — Z1231 Encounter for screening mammogram for malignant neoplasm of breast: Secondary | ICD-10-CM | POA: Diagnosis not present

## 2022-08-17 ENCOUNTER — Encounter (HOSPITAL_BASED_OUTPATIENT_CLINIC_OR_DEPARTMENT_OTHER): Payer: Self-pay

## 2022-08-20 ENCOUNTER — Ambulatory Visit (HOSPITAL_BASED_OUTPATIENT_CLINIC_OR_DEPARTMENT_OTHER): Payer: Medicare HMO | Admitting: Pulmonary Disease

## 2022-08-26 ENCOUNTER — Ambulatory Visit: Payer: Medicare HMO | Admitting: Family Medicine

## 2022-08-26 ENCOUNTER — Encounter: Payer: Self-pay | Admitting: Family Medicine

## 2022-08-26 VITALS — BP 170/80 | HR 59 | Temp 98.0°F | Ht 67.0 in | Wt 159.0 lb

## 2022-08-26 DIAGNOSIS — E7800 Pure hypercholesterolemia, unspecified: Secondary | ICD-10-CM | POA: Diagnosis not present

## 2022-08-26 DIAGNOSIS — I10 Essential (primary) hypertension: Secondary | ICD-10-CM | POA: Diagnosis not present

## 2022-08-26 DIAGNOSIS — E348 Other specified endocrine disorders: Secondary | ICD-10-CM

## 2022-08-26 DIAGNOSIS — M810 Age-related osteoporosis without current pathological fracture: Secondary | ICD-10-CM

## 2022-08-26 MED ORDER — TRAZODONE HCL 50 MG PO TABS: ORAL_TABLET | ORAL | 1 refills | Status: DC

## 2022-08-26 MED ORDER — PANTOPRAZOLE SODIUM 40 MG PO TBEC: 40.0000 mg | DELAYED_RELEASE_TABLET | Freq: Every day | ORAL | 1 refills | Status: DC

## 2022-08-26 NOTE — Progress Notes (Signed)
OFFICE VISIT  08/26/2022  CC:  Chief Complaint  Patient presents with   Medical Management of Chronic Issues    Pt is fasting    Patient is a 82 y.o. female who presents for 84-monthfollow-up hypertension and hyperlipidemia.  INTERIM HX: Feeling well. Has chronic insomnia->trouble initiating and maintaining sleep. Xanax in the past, but prior doctor told her it may cause Alzheimer's so it was discontinued. Melatonin no help.  Recently has been trying Tylenol PM.  She notes that this helps a little bit but causes excessive dry mouth.  She does not monitor blood pressure at home.  She is compliant with her losartan.  ROS as above, plus--> no fevers, no CP, no SOB, no wheezing, no cough, no dizziness, no HAs, no rashes, no melena/hematochezia.  No polyuria or polydipsia.  No myalgias or arthralgias.  No focal weakness, paresthesias, or tremors.  No acute vision or hearing abnormalities.  No dysuria or unusual/new urinary urgency or frequency.  No recent changes in lower legs. No n/v/d or abd pain.  No palpitations.     Past Medical History:  Diagnosis Date   Anxiety    [place]    noted on CT scanning   GERD (gastroesophageal reflux disease)    Hypercholesterolemia    Hypertension    Insomnia    Osteopenia 09/2017   T score -2.1 FRAX 23% and 8.3% (has RA).   Pulmonary nodule    followed by Dr. SHalford Chessman  Rheumatoid arthritis(714.0)    [place]rombocyt[place] ([place]    mild, chronic    Past Surgical History:  Procedure Laterality Date   ABDOMINAL HYSTERECTOMY  1991   TAH/BSO   BARTHOLIN GLAND CYST EXCISION  1980   RIGHT   BREAST SURGERY  1988   BREAST CYST   COLONOSCOPY N/A 08/24/2013   Procedure: COLONOSCOPY;  Surgeon: NRogene Houston MD;  [facility];  Service: Endoscopy;  Laterality: N/A;  100   COLONOSCOPY WITH PROPOFOL N/A 09/09/2020   diverticulosis and hemorrhoids, o/w normal.  Procedure: COLONOSCOPY WITH PROPOFOL;  Surgeon: Toledo, TBenay Pike MD;   Location: [facility];  Service: Gastroenterology;  Laterality: N/A;   CT CORONARY CA SCORING  07/2020   29th%tile   ESOPHAGOGASTRODUODENOSCOPY (EGD) WITH PROPOFOL N/A 09/09/2020   gastritis and erythematous duodenopathy. Procedure: ESOPHAGOGASTRODUODENOSCOPY (EGD) WITH PROPOFOL;  Surgeon: Toledo, TBenay Pike MD;  Location: [facility];  Service: Gastroenterology;  Laterality: N/A;   FOOT SURGERY     2 TIMES   FOOT SURGERY Left    HAND SURGERY     HEMORRHOID SURGERY     been over 20 years ago   OLECRANON BURSECTOMY Right 11/29/2013   Procedure: EXCISION OLECRANON BURSA RIGHT ;  Surgeon: GWynonia Sours MD;  [place]  Service: Orthopedics;  Laterality: Right;   OOPHORECTOMY     BSO   TENOSYNOVECTOMY Right 11/29/2013   Procedure: TENOSYNOVECTOMY FLEXOR TENDON RIGHT WRIST ;  Surgeon: GWynonia Sours MD;  [place] [facility]  Service: Orthopedics;  Laterality: Right;   WEIL OSTEOTOMY Right 08/10/2018   Procedure: WEIL OSTEOTOMY 2, 3,4 METATARSAL RIGHT FOOT, PROXIMAL INTERPHALANGEAL RESECTION TOES 2, 3, 4;  Surgeon: DNewt Minion MD;  [place]  Service: Orthopedics;  Laterality: Right;    Outpatient Medications Prior to Visit  Medication Sig Dispense Refill   Ascorbic Acid (VITAMIN C PO) Take 500 mg by mouth daily.     aspirin EC 81 MG tablet Take  1 tablet (81 mg total) by mouth daily. Swallow whole. 90 tablet 3   Cholecalciferol (VITAMIN D3) 25 MCG (1000 UT) CAPS Take 1,000 Units by mouth daily.     ciclopirox (LOPROX) 0.77 % cream as needed.     Cyanocobalamin (VITAMIN B-12 PO) Take 1,000 mg by mouth daily.     cycloSPORINE (RESTASIS) 0.05 % ophthalmic emulsion Place 1 drop into both eyes 2 (two) times daily.     diphenhydrAMINE-APAP, sleep, (PAIN RELIEVER PM PO) Take by mouth at bedtime.     etanercept (ENBREL SURECLICK) 50 MG/ML injection      fluticasone (FLONASE) 50 MCG/ACT nasal spray Place 1 spray into both nostrils daily as  needed for allergies or rhinitis.     ibuprofen (ADVIL,MOTRIN) 200 MG tablet Take 200 mg by mouth every 6 (six) hours as needed for headache or moderate pain.     levocetirizine (XYZAL) 5 MG tablet Take 5 mg by mouth every evening.     losartan (COZAAR) 50 MG tablet Take 1 tablet (50 mg total) by mouth daily. 90 tablet 3   mupirocin ointment (BACTROBAN) 2 % Apply 1 application topically daily. 22 g 0   rosuvastatin (CRESTOR) 10 MG tablet TAKE 1 TABLET BY MOUTH EVERY DAY 90 tablet 2   vitamin B-12 (CYANOCOBALAMIN) 1000 MCG tablet Take 1 tablet (1,000 mcg total) by mouth daily. 90 tablet 0   Naproxen Sodium (ALEVE PO) Take 220 mg by mouth at bedtime. (Patient not taking: Reported on 08/26/2022)     dicyclomine (BENTYL) 10 MG capsule Take 10 mg by mouth 4 (four) times daily -  before meals and at bedtime. (Patient not taking: Reported on 08/26/2022)     hydrOXYzine (ATARAX/VISTARIL) 25 MG tablet Take 25-50 mg by mouth at bedtime as needed. (Patient not taking: Reported on 08/26/2022)     pantoprazole (PROTONIX) 40 MG tablet Take 1 tablet (40 mg total) by mouth daily. 90 tablet 1   No facility-administered medications prior to visit.    Allergies  Allergen Reactions   Other Nausea And Vomiting and Other (See Comments)    Doesn't do well with pain  Medications, sensitivity to tape   Hydrocodone-Acetaminophen Nausea And Vomiting and Other (See Comments)    Review of Systems As per HPI  PE:    08/26/2022    9:10 AM 08/26/2022    8:55 AM 03/04/2022    1:47 PM  Vitals with BMI  Height  '5\' 7"'$  '5\' 7"'$   Weight  159 lbs 157 lbs  BMI  76.7 34.19  Systolic 379 024 98  Diastolic 80 77 62  Pulse  59 58   Physical Exam  Gen: Alert, well appearing.  Patient is oriented to person, place, time, and situation. AFFECT: pleasant, lucid thought and speech. CV: RRR, no m/r/g.   LUNGS: CTA bilat, nonlabored resps, good aeration in all lung fields. EXT: no clubbing or cyanosis.  no edema.    LABS:  Last  CBC Lab Results  Component Value Date   WBC 4.5 02/23/2022   HGB 13.6 02/23/2022   HCT 42.4 02/23/2022   MCV 90.0 02/23/2022   MCH 28.3 08/19/2021   RDW 13.2 02/23/2022   PLT 111.0 (L) 09/73/5329   Last metabolic panel Lab Results  Component Value Date   GLUCOSE 89 02/23/2022   NA 140 02/23/2022   K 5.1 02/23/2022   CL 102 02/23/2022   CO2 30 02/23/2022   BUN 13 02/23/2022   CREATININE 1.17 02/23/2022   GFRNONAA  59 (L) 08/19/2021   CALCIUM 9.5 02/23/2022   PROT 7.3 02/23/2022   ALBUMIN 4.5 02/23/2022   LABGLOB 3.0 08/19/2021   AGRATIO 1.3 08/19/2021   BILITOT 0.6 02/23/2022   ALKPHOS 95 02/23/2022   AST 25 02/23/2022   ALT 15 02/23/2022   ANIONGAP 10 08/19/2021   Last lipids Lab Results  Component Value Date   CHOL 115 02/23/2022   HDL 54.00 02/23/2022   LDLCALC 49 02/23/2022   TRIG 60.0 02/23/2022   CHOLHDL 2 02/23/2022   Last thyroid functions Lab Results  Component Value Date   TSH 2.128 06/26/2020   Last vitamin D Lab Results  Component Value Date   VD25OH 46 07/01/2015   Last vitamin B12 and Folate Lab Results  Component Value Date   PPHKFEXM14 709 08/19/2021   FOLATE 66.0 08/19/2021   IMPRESSION AND PLAN:  #1 hypertension, no history of poor control but blood pressure significantly elevated here today.  She did not take her medication today. I would like her to monitor blood pressure at home daily for the next week and we will review these at follow-up visit at that time. Electrolytes and creatinine today.  2.  Hypercholesterolemia.  Doing well on rosuvastatin 10 mg daily. Lipid panel today.  3.  Insomnia. Will do trial of trazodone 50 mg, 1-2 nightly as needed. Therapeutic expectations and side effect profile of medication discussed today.  Patient's questions answered.  An After Visit Summary was printed and given to the patient.  FOLLOW UP: Return in about 1 week (around 09/02/2022) for f/u HTN and insomnia.  Signed:  Crissie Sickles, MD            08/26/2022

## 2022-08-27 LAB — COMPREHENSIVE METABOLIC PANEL
AG Ratio: 1.6 (calc) (ref 1.0–2.5)
ALT: 13 U/L (ref 6–29)
AST: 20 U/L (ref 10–35)
Albumin: 4.4 g/dL (ref 3.6–5.1)
Alkaline phosphatase (APISO): 75 U/L (ref 37–153)
BUN/Creatinine Ratio: 13 (calc) (ref 6–22)
BUN: 13 mg/dL (ref 7–25)
CO2: 31 mmol/L (ref 20–32)
Calcium: 9.5 mg/dL (ref 8.6–10.4)
Chloride: 103 mmol/L (ref 98–110)
Creat: 1.04 mg/dL — ABNORMAL HIGH (ref 0.60–1.00)
Globulin: 2.7 g/dL (calc) (ref 1.9–3.7)
Glucose, Bld: 91 mg/dL (ref 65–99)
Potassium: 5.1 mmol/L (ref 3.5–5.3)
Sodium: 140 mmol/L (ref 135–146)
Total Bilirubin: 0.4 mg/dL (ref 0.2–1.2)
Total Protein: 7.1 g/dL (ref 6.1–8.1)

## 2022-08-27 LAB — LIPID PANEL
Cholesterol: 122 mg/dL (ref <200)
HDL: 54 mg/dL (ref >=50)
LDL Cholesterol (Calc): 51 mg/dL (calc)
Non-HDL Cholesterol (Calc): 68 mg/dL (calc) (ref <130)
Total CHOL/HDL Ratio: 2.3 (calc) (ref <5.0)
Triglycerides: 85 mg/dL (ref <150)

## 2022-09-02 ENCOUNTER — Ambulatory Visit: Payer: Medicare HMO | Admitting: Family Medicine

## 2022-09-03 ENCOUNTER — Encounter: Payer: Self-pay | Admitting: Family Medicine

## 2022-09-03 ENCOUNTER — Telehealth: Payer: Self-pay

## 2022-09-03 ENCOUNTER — Ambulatory Visit (INDEPENDENT_AMBULATORY_CARE_PROVIDER_SITE_OTHER): Payer: Medicare HMO | Admitting: Family Medicine

## 2022-09-03 ENCOUNTER — Ambulatory Visit: Payer: Medicare HMO

## 2022-09-03 VITALS — BP 146/63 | HR 57 | Temp 97.3°F | Ht 67.0 in | Wt 160.0 lb

## 2022-09-03 DIAGNOSIS — I10 Essential (primary) hypertension: Secondary | ICD-10-CM | POA: Diagnosis not present

## 2022-09-03 DIAGNOSIS — N1831 Chronic kidney disease, stage 3a: Secondary | ICD-10-CM | POA: Diagnosis not present

## 2022-09-03 DIAGNOSIS — F5101 Primary insomnia: Secondary | ICD-10-CM

## 2022-09-03 MED ORDER — LOSARTAN POTASSIUM 50 MG PO TABS: ORAL_TABLET | ORAL | 3 refills | Status: DC

## 2022-09-03 MED ORDER — ALPRAZOLAM 0.5 MG PO TABS: ORAL_TABLET | ORAL | 1 refills | Status: DC

## 2022-09-03 NOTE — Telephone Encounter (Signed)
Spoke with pt to schedule AWV in office. Patient declined to schedule wellness visit at this time.   

## 2022-09-03 NOTE — Progress Notes (Signed)
OFFICE VISIT  09/03/2022  CC:  Chief Complaint  Patient presents with   Medical Management of Chronic Issues    Patient is a 82 y.o. female who presents for 1 week follow-up hypertension and insomnia. A/P as of last visit: "#1 hypertension, no history of poor control but blood pressure significantly elevated here today.  She did not take her medication today. I would like her to monitor blood pressure at home daily for the next week and we will review these at follow-up visit at that time. Electrolytes and creatinine today.   2.  Hypercholesterolemia.  Doing well on rosuvastatin 10 mg daily. Lipid panel today.   3.  Insomnia. Will do trial of trazodone 50 mg, 1-2 nightly as needed. Therapeutic expectations and side effect profile of medication discussed today.  Patient's questions answered."  INTERIM HX: Marlissa feels fine. Blood pressures reviewed, they are still up significantly.  Average systolic 106 but occasionally up into the 170s.  Average diastolic 72.  No heart rate data. Of note she did take a trazodone tablet and then noted the next morning she did not feel very good, blood pressure was 175/46 at that time. She has not taken it again.  She states taking alprazolam in the past at the 0.5 mg dose helped well with her insomnia and resulted in no adverse side effects.  She wonders if she can start this again.  ROS: No chest pain, no dizziness, no headaches, no visual abnormalities  Past Medical History:  Diagnosis Date   Anxiety    Emphysema lung ([facility])    noted on CT scanning   GERD (gastroesophageal reflux disease)    Hypercholesterolemia    Hypertension    Insomnia    Osteopenia 09/2017   T score -2.1 FRAX 23% and 8.3% (has RA).   Pulmonary nodule    followed by Dr. Halford Chessman   Rheumatoid arthritis(714.0)    hands and feet   Right knee pain    ortho->inj summer 2023 helpful   Thrombocytopenia ([place])    mild, chronic    Past Surgical History:  Procedure Laterality  Date   ABDOMINAL HYSTERECTOMY  1991   TAH/BSO   BARTHOLIN GLAND CYST EXCISION  1980   RIGHT   BREAST SURGERY  1988   BREAST CYST   COLONOSCOPY N/A 08/24/2013   Procedure: COLONOSCOPY;  Surgeon: Rogene Houston, MD;  [facility];  Service: Endoscopy;  Laterality: N/A;  100   COLONOSCOPY WITH PROPOFOL N/A 09/09/2020   diverticulosis and hemorrhoids, o/w normal.  Procedure: COLONOSCOPY WITH PROPOFOL;  Surgeon: Toledo, Benay Pike, MD;  Location: [facility];  Service: Gastroenterology;  Laterality: N/A;   CT CORONARY CA SCORING  07/2020   29th%tile   ESOPHAGOGASTRODUODENOSCOPY (EGD) WITH PROPOFOL N/A 09/09/2020   gastritis and erythematous duodenopathy. Procedure: ESOPHAGOGASTRODUODENOSCOPY (EGD) WITH PROPOFOL;  Surgeon: Toledo, Benay Pike, MD;  Location: [facility];  Service: Gastroenterology;  Laterality: N/A;   FOOT SURGERY     2 TIMES   FOOT SURGERY Left    HAND SURGERY     HEMORRHOID SURGERY     been over 20 years ago   OLECRANON BURSECTOMY Right 11/29/2013   Procedure: EXCISION OLECRANON BURSA RIGHT ;  Surgeon: Wynonia Sours, MD;  Location: [place];  Service: Orthopedics;  Laterality: Right;   OOPHORECTOMY     BSO   TENOSYNOVECTOMY Right 11/29/2013   Procedure: TENOSYNOVECTOMY FLEXOR TENDON RIGHT WRIST ;  Surgeon: Wynonia Sours, MD;  Location: [facility]  [place];  Service: Orthopedics;  Laterality: Right;   WEIL OSTEOTOMY Right 08/10/2018   Procedure: WEIL OSTEOTOMY 2, 3,4 METATARSAL RIGHT FOOT, PROXIMAL INTERPHALANGEAL RESECTION TOES 2, 3, 4;  Surgeon: Newt Minion, MD;  Location: [place];  Service: Orthopedics;  Laterality: Right;    Outpatient Medications Prior to Visit  Medication Sig Dispense Refill   Ascorbic Acid (VITAMIN C PO) Take 500 mg by mouth daily.     aspirin EC 81 MG tablet Take 1 tablet (81 mg total) by mouth daily. Swallow whole. 90 tablet 3   Cholecalciferol (VITAMIN D3) 25 MCG (1000 UT) CAPS Take 1,000 Units by mouth  daily.     ciclopirox (LOPROX) 0.77 % cream as needed.     Cyanocobalamin (VITAMIN B-12 PO) Take 1,000 mg by mouth daily.     cycloSPORINE (RESTASIS) 0.05 % ophthalmic emulsion Place 1 drop into both eyes 2 (two) times daily.     etanercept (ENBREL SURECLICK) 50 MG/ML injection      mupirocin ointment (BACTROBAN) 2 % Apply 1 application topically daily. 22 g 0   Naproxen Sodium (ALEVE PO) Take 220 mg by mouth at bedtime.     pantoprazole (PROTONIX) 40 MG tablet Take 1 tablet (40 mg total) by mouth daily. 90 tablet 1   rosuvastatin (CRESTOR) 10 MG tablet TAKE 1 TABLET BY MOUTH EVERY DAY 90 tablet 2   ibuprofen (ADVIL,MOTRIN) 200 MG tablet Take 200 mg by mouth every 6 (six) hours as needed for headache or moderate pain.     losartan (COZAAR) 50 MG tablet Take 1 tablet (50 mg total) by mouth daily. 90 tablet 3   traZODone (DESYREL) 50 MG tablet 1-2 tabs po qhs as needed for insomnia 15 tablet 1   diphenhydrAMINE-APAP, sleep, (PAIN RELIEVER PM PO) Take by mouth at bedtime. (Patient not taking: Reported on 09/03/2022)     fluticasone (FLONASE) 50 MCG/ACT nasal spray Place 1 spray into both nostrils daily as needed for allergies or rhinitis. (Patient not taking: Reported on 09/03/2022)     levocetirizine (XYZAL) 5 MG tablet Take 5 mg by mouth every evening. (Patient not taking: Reported on 09/03/2022)     vitamin B-12 (CYANOCOBALAMIN) 1000 MCG tablet Take 1 tablet (1,000 mcg total) by mouth daily. (Patient not taking: Reported on 09/03/2022) 90 tablet 0   No facility-administered medications prior to visit.    Allergies  Allergen Reactions   Other Nausea And Vomiting and Other (See Comments)    Doesn't do well with pain  Medications, sensitivity to tape   Hydrocodone-Acetaminophen Nausea And Vomiting and Other (See Comments)    Review of Systems As per HPI  PE:    09/03/2022   10:44 AM 09/03/2022   10:36 AM 08/26/2022    9:10 AM  Vitals with BMI  Height  '5\' 7"'$    Weight  160 lbs   BMI  09.47    Systolic 096 283 662  Diastolic 63 69 80  Pulse  57     Physical Exam  Gen: Alert, well appearing.  Patient is oriented to person, place, time, and situation. AFFECT: pleasant, lucid thought and speech. No further exam today.  LABS:  Last CBC Lab Results  Component Value Date   WBC 4.5 02/23/2022   HGB 13.6 02/23/2022   HCT 42.4 02/23/2022   MCV 90.0 02/23/2022   MCH 28.3 08/19/2021   RDW 13.2 02/23/2022   PLT 111.0 (L) 94/76/5465   Last metabolic panel Lab Results  Component Value  Date   GLUCOSE 91 08/26/2022   NA 140 08/26/2022   K 5.1 08/26/2022   CL 103 08/26/2022   CO2 31 08/26/2022   BUN 13 08/26/2022   CREATININE 1.04 (H) 08/26/2022   GFRNONAA 59 (L) 08/19/2021   CALCIUM 9.5 08/26/2022   PROT 7.1 08/26/2022   ALBUMIN 4.5 02/23/2022   LABGLOB 3.0 08/19/2021   AGRATIO 1.3 08/19/2021   BILITOT 0.4 08/26/2022   ALKPHOS 95 02/23/2022   AST 20 08/26/2022   ALT 13 08/26/2022   ANIONGAP 10 08/19/2021   Last lipids Lab Results  Component Value Date   CHOL 122 08/26/2022   HDL 54 08/26/2022   LDLCALC 51 08/26/2022   TRIG 85 08/26/2022   CHOLHDL 2.3 08/26/2022   Last thyroid functions Lab Results  Component Value Date   TSH 2.128 06/26/2020   IMPRESSION AND PLAN:  1 hypertension, poor control. Increase losartan to 100 mg a day. Recheck in 2 weeks and we will get the med at that time.  #2 insomnia.  Trazodone caused "hangover effect". Alprazolam has worked well in the past. Restart alprazolam 0.5 mg nightly. Controlled substance contract today.  #3 chronic renal insufficiency stage IIIa. Avoid NSAIDs. Will be rechecking kidney function in 2 weeks.  An After Visit Summary was printed and given to the patient.  FOLLOW UP: Return in about 2 weeks (around 09/17/2022) for f/u HTN.  Signed:  Crissie Sickles, MD           09/03/2022

## 2022-09-03 NOTE — Patient Instructions (Signed)
Take TWO of the '50mg'$  losartan tabs every day

## 2022-09-08 ENCOUNTER — Ambulatory Visit (INDEPENDENT_AMBULATORY_CARE_PROVIDER_SITE_OTHER): Payer: Medicare HMO

## 2022-09-08 ENCOUNTER — Encounter (HOSPITAL_BASED_OUTPATIENT_CLINIC_OR_DEPARTMENT_OTHER): Payer: Self-pay | Admitting: Pulmonary Disease

## 2022-09-08 ENCOUNTER — Ambulatory Visit (INDEPENDENT_AMBULATORY_CARE_PROVIDER_SITE_OTHER): Payer: Medicare HMO | Admitting: Pulmonary Disease

## 2022-09-08 VITALS — BP 134/72 | HR 69 | Wt 160.0 lb

## 2022-09-08 DIAGNOSIS — R911 Solitary pulmonary nodule: Secondary | ICD-10-CM

## 2022-09-08 DIAGNOSIS — J849 Interstitial pulmonary disease, unspecified: Secondary | ICD-10-CM

## 2022-09-08 DIAGNOSIS — J432 Centrilobular emphysema: Secondary | ICD-10-CM

## 2022-09-08 NOTE — Progress Notes (Signed)
[facility]  Chief Complaint  Patient presents with   Follow-up    1 year follow up. Pt had chest xray before visit. Pt states she is doing well since last year with no issues.     Constitutional:  BP 134/72 (BP Location: Left Arm, Patient Position: Sitting, Cuff Size: Normal)   Pulse 69   Wt 160 lb (72.6 kg)   LMP  (LMP Unknown)   SpO2 99%   BMI 25.06 kg/m   Past Medical History:  Endometriosis, HTN, HLD, RA, RBBB, Anxiety  Past Surgical History:  She  has a past surgical history that includes Abdominal hysterectomy (1991); Hemorrhoid surgery; Hand surgery; Foot surgery; Breast surgery (1988); Bartholin gland cyst excision (1980); Oophorectomy; Colonoscopy (N/A, 08/24/2013); Olecranon bursectomy (Right, 11/29/2013); Tenosynovectomy (Right, 11/29/2013); Foot surgery (Left); Weil osteotomy (Right, 08/10/2018); Colonoscopy with propofol (N/A, 09/09/2020); Esophagogastroduodenoscopy (egd) with propofol (N/A, 09/09/2020); and CT CORONARY CA SCORING (07/2020).  Brief Summary:  Bethany Grant is a 82 y.o. female former smoker with lung nodules, family history of lung cancer, and history of rheumatoid arthritis.      Subjective:   CT chest from September showed stable lung nodules, but some progression of emphysema.  No cough, wheeze, sputum, or chest congestion.  Exercises on a regular basis.  No breathing issues while asleep.  Changed to enbrel and arthritis has been okay.  Physical Exam:   Appearance - well kempt   ENMT - no sinus tenderness, no oral exudate, no LAN, Mallampati 2 airway, no stridor  Respiratory - equal breath sounds bilaterally, no wheezing or rales  CV - s1s2 regular rate and rhythm, no murmurs  Ext - changes of RA in her hands  Skin - no rashes  Psych - normal mood and affect   Pulmonary testing:  Bronchoscopy Oct 2009>>Pneumatocyte atypia suggestive of obstructive pneumonitis, can be seen with RA in the  lungs PFT 06/19/08 >> FEV1 2.58(107%), FEV1% 71, TLC 6.07(111%), DLCO 96%, no BD  Chest Imaging:  CT chest 01/25/08 >> 4 mm Lt lower lobe nodule, hazy reticular infiltrate LLL CT chest 04/18/08 >> increase in LLL reticular infiltrate CT chest 05/23/09 >> 3.8 x 3.6 cm ascending aorta, scoliosis, decreased size LLL nodule, reticular changes LLL decreased Coronary CT chest 08/05/20 >> bullous disease in Lt lung base, b/l mild peripheral GGO CT angio chest 04/24/21 >> atherosclerosis, mild emphysema, stable RUL nodule since 2010, bullous scar LLL CT chest 04/10/22 >> moderate centrilobular and paraseptal emphysema, apical scarring, 1.3 x 0.8 cm RUL nodule stable, 5 mm RUL nodule stable, 1.0 x 0.7 cm LLL nodule stable  Cardiac Tests:  Echo 08/06/20 >> EF 60 to 65%  Social History:  She  reports that she quit smoking about 18 years ago. Her smoking use included cigarettes. She has a 24.00 pack-year smoking history. She has never used smokeless tobacco. She reports that she does not drink alcohol and does not use drugs.  Family History:  Her family history includes Cancer in her brother, mother, sister, and sister; Hypertension in her sister.     Assessment/Plan:   History of tobacco abuse, and family history of lung cancer with prior imaging studies showing reticulonodular infiltrates in setting of rheumatoid arthritis, and emphysema. - follow up CT chest w/o contrast in September 2024 - defer follow up PFT or inhaler therapy unless she develops respiratory symptoms  Rheumatoid arthritis.  - she will follow up with Dr. Amil Amen with rheumatology  Time Spent  Involved in Patient Care on Day of Examination:  26 minutes  Follow up:   Patient Instructions  Will schedule CT chest for September 2024 and follow up after that  Medication List:   Allergies as of 09/08/2022       Reactions   Other Nausea And Vomiting, Other (See Comments)   Doesn't do well with pain  Medications, sensitivity to  tape   Hydrocodone-acetaminophen Nausea And Vomiting, Other (See Comments)   Trazodone And Nefazodone Other (See Comments)   Fatigue/hangover effect        Medication List        Accurate as of September 08, 2022  1:44 PM. If you have any questions, ask your nurse or doctor.          ALEVE PO Take 220 mg by mouth at bedtime.   ALPRAZolam 0.5 MG tablet Commonly known as: XANAX 1 tab po qhs   aspirin EC 81 MG tablet Take 1 tablet (81 mg total) by mouth daily. Swallow whole.   ciclopirox 0.77 % cream Commonly known as: LOPROX as needed.   cycloSPORINE 0.05 % ophthalmic emulsion Commonly known as: RESTASIS Place 1 drop into both eyes 2 (two) times daily.   Enbrel SureClick 50 MG/ML injection Generic drug: etanercept   losartan 50 MG tablet Commonly known as: COZAAR 2 tabs po qd   mupirocin ointment 2 % Commonly known as: Bactroban Apply 1 application topically daily.   pantoprazole 40 MG tablet Commonly known as: PROTONIX Take 1 tablet (40 mg total) by mouth daily.   rosuvastatin 10 MG tablet Commonly known as: CRESTOR TAKE 1 TABLET BY MOUTH EVERY DAY   VITAMIN B-12 PO Take 1,000 mg by mouth daily.   VITAMIN C PO Take 500 mg by mouth daily.   Vitamin D3 25 MCG (1000 UT) capsule Generic drug: Cholecalciferol Take 1,000 Units by mouth daily.        Signature:  Chesley Mires, MD [place] Pager - [phone] 09/08/2022, 1:44 PM

## 2022-09-08 NOTE — Patient Instructions (Signed)
Will schedule CT chest for September 2024 and follow up after that

## 2022-09-17 ENCOUNTER — Encounter: Payer: Self-pay | Admitting: Family Medicine

## 2022-09-17 ENCOUNTER — Ambulatory Visit (INDEPENDENT_AMBULATORY_CARE_PROVIDER_SITE_OTHER): Payer: Medicare HMO | Admitting: Family Medicine

## 2022-09-17 VITALS — BP 127/66 | HR 54 | Temp 97.9°F | Ht 67.0 in | Wt 158.4 lb

## 2022-09-17 DIAGNOSIS — I10 Essential (primary) hypertension: Secondary | ICD-10-CM | POA: Diagnosis not present

## 2022-09-17 LAB — BASIC METABOLIC PANEL
BUN: 16 mg/dL (ref 6–23)
CO2: 32 mEq/L (ref 19–32)
Calcium: 9.5 mg/dL (ref 8.4–10.5)
Chloride: 99 mEq/L (ref 96–112)
Creatinine, Ser: 1.14 mg/dL (ref 0.40–1.20)
GFR: 45.86 mL/min — ABNORMAL LOW (ref >60.00)
Glucose, Bld: 83 mg/dL (ref 70–99)
Potassium: 4.7 mEq/L (ref 3.5–5.1)
Sodium: 137 mEq/L (ref 135–145)

## 2022-09-17 MED ORDER — AMLODIPINE BESYLATE 5 MG PO TABS: 5.0000 mg | ORAL_TABLET | Freq: Every day | ORAL | 0 refills | Status: DC

## 2022-09-17 MED ORDER — LOSARTAN POTASSIUM 100 MG PO TABS: 100.0000 mg | ORAL_TABLET | Freq: Every day | ORAL | 1 refills | Status: DC

## 2022-09-17 NOTE — Progress Notes (Signed)
OFFICE VISIT  09/17/2022  CC:  Chief Complaint  Patient presents with   Medical Management of Chronic Issues    Patient is a 82 y.o. female who presents for 2-week follow-up hypertension. A/P as of last visit: "1 hypertension, poor control. Increase losartan to 100 mg a day. Recheck in 2 weeks and we will get the med at that time.   #2 insomnia.  Trazodone caused "hangover effect". Alprazolam has worked well in the past. Restart alprazolam 0.5 mg nightly. Controlled substance contract today.   #3 chronic renal insufficiency stage IIIa. Avoid NSAIDs. Will be rechecking kidney function in 2 weeks."  INTERIM HX: BP avg 148/70 Feeling well.   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   Pulmonary nodule    followed by Dr. Halford Chessman   Rheumatoid arthritis(714.0)    hands and feet   Right knee pain    ortho->inj summer 2023 helpful   Thrombocytopenia ([place])    mild, chronic    Past Surgical History:  Procedure Laterality Date   ABDOMINAL HYSTERECTOMY  1991   TAH/BSO   BARTHOLIN GLAND CYST EXCISION  1980   RIGHT   BREAST SURGERY  1988   BREAST CYST   COLONOSCOPY N/A 08/24/2013   Procedure: COLONOSCOPY;  Surgeon: Rogene Houston, MD;  [facility];  Service: Endoscopy;  Laterality: N/A;  100   COLONOSCOPY WITH PROPOFOL N/A 09/09/2020   diverticulosis and hemorrhoids, o/w normal.  Procedure: COLONOSCOPY WITH PROPOFOL;  Surgeon: Toledo, Benay Pike, MD;  Location: [facility];  Service: Gastroenterology;  Laterality: N/A;   CT CORONARY CA SCORING  07/2020   29th%tile    ESOPHAGOGASTRODUODENOSCOPY (EGD) WITH PROPOFOL N/A 09/09/2020   gastritis and erythematous duodenopathy. Procedure: ESOPHAGOGASTRODUODENOSCOPY (EGD) WITH PROPOFOL;  Surgeon: Toledo, Benay Pike, MD;  Location: [facility];  Service: Gastroenterology;  Laterality: N/A;   FOOT SURGERY     2 TIMES   FOOT SURGERY Left    HAND SURGERY     HEMORRHOID SURGERY     been over 20 years ago   OLECRANON BURSECTOMY Right 11/29/2013   Procedure: EXCISION OLECRANON BURSA RIGHT ;  Surgeon: Wynonia Sours, MD;  Location: [place];  Service: Orthopedics;  Laterality: Right;   OOPHORECTOMY     BSO   TENOSYNOVECTOMY Right 11/29/2013   Procedure: TENOSYNOVECTOMY FLEXOR TENDON RIGHT WRIST ;  Surgeon: Wynonia Sours, MD;  Location: [place];  Service: Orthopedics;  Laterality: Right;   WEIL OSTEOTOMY Right 08/10/2018   Procedure: WEIL OSTEOTOMY 2, 3,4 METATARSAL RIGHT FOOT, PROXIMAL INTERPHALANGEAL RESECTION TOES 2, 3, 4;  Surgeon: Newt Minion, MD;  Location: [place];  Service: Orthopedics;  Laterality: Right;    Outpatient Medications Prior to Visit  Medication Sig Dispense Refill   ALPRAZolam (XANAX) 0.5 MG tablet 1 tab po qhs 90 tablet 1   Ascorbic Acid (VITAMIN C PO) Take 500 mg by mouth daily.     aspirin EC 81 MG tablet Take 1 tablet (81 mg total) by mouth  daily. Swallow whole. 90 tablet 3   Cholecalciferol (VITAMIN D3) 25 MCG (1000 UT) CAPS Take 1,000 Units by mouth daily.     ciclopirox (LOPROX) 0.77 % cream as needed.     Cyanocobalamin (VITAMIN B-12 PO) Take 1,000 mg by mouth daily.     cycloSPORINE (RESTASIS) 0.05 % ophthalmic emulsion Place 1 drop into both eyes 2 (two) times daily.     etanercept (ENBREL SURECLICK) 50 MG/ML injection      mupirocin ointment (BACTROBAN) 2 % Apply 1 application topically daily. 22 g 0   Naproxen Sodium (ALEVE PO) Take 220 mg by mouth at bedtime.     pantoprazole (PROTONIX) 40 MG tablet Take 1 tablet (40 mg total) by mouth daily. 90  tablet 1   rosuvastatin (CRESTOR) 10 MG tablet TAKE 1 TABLET BY MOUTH EVERY DAY 90 tablet 2   losartan (COZAAR) 50 MG tablet 2 tabs po qd 90 tablet 3   No facility-administered medications prior to visit.    Allergies  Allergen Reactions   Other Nausea And Vomiting and Other (See Comments)    Doesn't do well with pain  Medications, sensitivity to tape   Hydrocodone-Acetaminophen Nausea And Vomiting and Other (See Comments)   Trazodone And Nefazodone Other (See Comments)    Fatigue/hangover effect    Review of Systems As per HPI  PE:    09/17/2022    1:03 PM 09/08/2022    1:23 PM 09/03/2022   10:44 AM  Vitals with BMI  Height 5' 7"$     Weight 158 lbs 6 oz 160 lbs   BMI [id number]   Systolic [id number]  Diastolic 66 72 63  Pulse 54 69      Physical Exam  Gen: Alert, well appearing.  Patient is oriented to person, place, time, and situation. AFFECT: pleasant, lucid thought and speech. CV: RRR (rate 60), no m/r/g.   LUNGS: CTA bilat, nonlabored resps, good aeration in all lung fields. EXT: no clubbing or cyanosis.  no edema.    LABS:  Last metabolic panel Lab Results  Component Value Date   GLUCOSE 91 08/26/2022   NA 140 08/26/2022   K 5.1 08/26/2022   CL 103 08/26/2022   CO2 31 08/26/2022   BUN 13 08/26/2022   CREATININE 1.04 (H) 08/26/2022   GFRNONAA 59 (L) 08/19/2021   CALCIUM 9.5 08/26/2022   PROT 7.1 08/26/2022   ALBUMIN 4.5 02/23/2022   LABGLOB 3.0 08/19/2021   AGRATIO 1.3 08/19/2021   BILITOT 0.4 08/26/2022   ALKPHOS 95 02/23/2022   AST 20 08/26/2022   ALT 13 08/26/2022   ANIONGAP 10 08/19/2021   IMPRESSION AND PLAN:  #1 uncontrolled hypertension in the setting of chronic renal insufficiency stage III. Start amlodipine 5 mg a day and continue losartan 100 mg a day. Check basic metabolic panel today.  An After Visit Summary was printed and given to the patient.  FOLLOW UP: Return in about 2 weeks (around 10/01/2022) for f/u HTN.  Signed:  Crissie Sickles, MD           09/17/2022

## 2022-10-02 ENCOUNTER — Encounter: Payer: Self-pay | Admitting: Family Medicine

## 2022-10-02 ENCOUNTER — Ambulatory Visit (INDEPENDENT_AMBULATORY_CARE_PROVIDER_SITE_OTHER): Payer: Medicare HMO | Admitting: Family Medicine

## 2022-10-02 VITALS — BP 138/62 | HR 63 | Temp 97.6°F | Ht 67.0 in | Wt 162.4 lb

## 2022-10-02 DIAGNOSIS — I10 Essential (primary) hypertension: Secondary | ICD-10-CM

## 2022-10-02 MED ORDER — AMLODIPINE BESYLATE 10 MG PO TABS: 10.0000 mg | ORAL_TABLET | Freq: Every day | ORAL | 0 refills | Status: DC

## 2022-10-02 NOTE — Progress Notes (Signed)
OFFICE VISIT  10/02/2022  CC:  Chief Complaint  Patient presents with   Medical Management of Chronic Issues    Hypertension    Patient is a 82 y.o. female who presents for 2-week follow-up uncontrolled hypertension. A/P as of last visit: "1 uncontrolled hypertension in the setting of chronic renal insufficiency stage III. Start amlodipine 5 mg a day and continue losartan 100 mg a day. Check basic metabolic panel today."  INTERIM HX: Renal function stable, electrolytes normal last visit.  She feels well. Average blood pressure over the last couple weeks is around Q000111Q systolic and 65 diastolic.  Average heart rate 65. No side effects from medications.   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   Pulmonary nodule    followed by Dr. Halford Chessman   Rheumatoid arthritis(714.0)    hands and feet   Right knee pain    ortho->inj summer 2023 helpful   Thrombocytopenia ([place])    mild, chronic    Past Surgical History:  Procedure Laterality Date   ABDOMINAL HYSTERECTOMY  1991   TAH/BSO   BARTHOLIN GLAND CYST EXCISION  1980   RIGHT   BREAST SURGERY  1988   BREAST CYST   COLONOSCOPY N/A 08/24/2013   Procedure: COLONOSCOPY;  Surgeon: Rogene Houston, MD;  [facility];  Service: Endoscopy;  Laterality: N/A;  100   COLONOSCOPY WITH PROPOFOL N/A 09/09/2020   diverticulosis and hemorrhoids, o/w normal.  Procedure: COLONOSCOPY WITH PROPOFOL;  Surgeon: Toledo, Benay Pike, MD;  Location: [facility];  Service: Gastroenterology;  Laterality: N/A;   CT CORONARY CA SCORING  07/2020   29th%tile    ESOPHAGOGASTRODUODENOSCOPY (EGD) WITH PROPOFOL N/A 09/09/2020   gastritis and erythematous duodenopathy. Procedure: ESOPHAGOGASTRODUODENOSCOPY (EGD) WITH PROPOFOL;  Surgeon: Toledo, Benay Pike, MD;  Location: [facility];  Service: Gastroenterology;  Laterality: N/A;   FOOT SURGERY     2 TIMES   FOOT SURGERY Left    HAND SURGERY     HEMORRHOID SURGERY     been over 20 years ago   OLECRANON BURSECTOMY Right 11/29/2013   Procedure: EXCISION OLECRANON BURSA RIGHT ;  Surgeon: Wynonia Sours, MD;  Location: [place];  Service: Orthopedics;  Laterality: Right;   OOPHORECTOMY     BSO   TENOSYNOVECTOMY Right 11/29/2013   Procedure: TENOSYNOVECTOMY FLEXOR TENDON RIGHT WRIST ;  Surgeon: Wynonia Sours, MD;  Location: [place];  Service: Orthopedics;  Laterality: Right;   WEIL OSTEOTOMY Right 08/10/2018   Procedure: WEIL OSTEOTOMY 2, 3,4 METATARSAL RIGHT FOOT, PROXIMAL INTERPHALANGEAL RESECTION TOES 2, 3, 4;  Surgeon: Newt Minion, MD;  Location: [place];  Service: Orthopedics;  Laterality: Right;    Outpatient Medications Prior to Visit  Medication Sig Dispense Refill   ALPRAZolam (XANAX) 0.5 MG tablet 1 tab po qhs 90 tablet 1   Ascorbic Acid (VITAMIN C PO) Take 500 mg by mouth daily.     aspirin EC 81 MG tablet Take 1 tablet (81 mg total) by mouth daily.  Swallow whole. 90 tablet 3   Cholecalciferol (VITAMIN D3) 25 MCG (1000 UT) CAPS Take 1,000 Units by mouth daily.     ciclopirox (LOPROX) 0.77 % cream as needed.     Cyanocobalamin (VITAMIN B-12 PO) Take 1,000 mg by mouth daily.     cycloSPORINE (RESTASIS) 0.05 % ophthalmic emulsion Place 1 drop into both eyes 2 (two) times daily.     etanercept (ENBREL SURECLICK) 50 MG/ML injection      losartan (COZAAR) 100 MG tablet Take 1 tablet (100 mg total) by mouth daily. 90 tablet 1   mupirocin ointment (BACTROBAN) 2 % Apply 1 application topically daily. 22 g 0   Naproxen Sodium (ALEVE PO) Take 220 mg by mouth at bedtime.      pantoprazole (PROTONIX) 40 MG tablet Take 1 tablet (40 mg total) by mouth daily. 90 tablet 1   rosuvastatin (CRESTOR) 10 MG tablet TAKE 1 TABLET BY MOUTH EVERY DAY 90 tablet 2   amLODipine (NORVASC) 5 MG tablet Take 1 tablet (5 mg total) by mouth daily. 30 tablet 0   No facility-administered medications prior to visit.    Allergies  Allergen Reactions   Other Nausea And Vomiting and Other (See Comments)    Doesn't do well with pain  Medications, sensitivity to tape   Hydrocodone-Acetaminophen Nausea And Vomiting and Other (See Comments)   Trazodone And Nefazodone Other (See Comments)    Fatigue/hangover effect    Review of Systems As per HPI  PE:    10/02/2022   10:07 AM 09/17/2022    1:03 PM 09/08/2022    1:23 PM  Vitals with BMI  Height '5\' 7"'$  '5\' 7"'$    Weight 162 lbs 6 oz 158 lbs 6 oz 160 lbs  BMI 25.43 [id number]  Systolic 0000000 [id number]  Diastolic 62 66 72  Pulse 63 54 69     Physical Exam  Gen: Alert, well appearing.  Patient is oriented to person, place, time, and situation. AFFECT: pleasant, lucid thought and speech. CV: RRR, no m/r/g.   LUNGS: CTA bilat, nonlabored resps, good aeration in all lung fields. EXT: no clubbing or cyanosis.  no edema.    LABS:  Last metabolic panel Lab Results  Component Value Date   GLUCOSE 83 09/17/2022   NA 137 09/17/2022   K 4.7 09/17/2022   CL 99 09/17/2022   CO2 32 09/17/2022   BUN 16 09/17/2022   CREATININE 1.14 09/17/2022   GFRNONAA 59 (L) 08/19/2021   CALCIUM 9.5 09/17/2022   PROT 7.1 08/26/2022   ALBUMIN 4.5 02/23/2022   LABGLOB 3.0 08/19/2021   AGRATIO 1.3 08/19/2021   BILITOT 0.4 08/26/2022   ALKPHOS 95 02/23/2022   AST 20 08/26/2022   ALT 13 08/26/2022   ANIONGAP 10 08/19/2021   IMPRESSION AND PLAN:  #1 uncontrolled hypertension in the setting of chronic renal insufficiency stage III. Increase amlodipine to 10 mg daily and continue losartan 100 mg daily. Continue home blood pressure monitoring and  return to review these with me in 2 weeks.  An After Visit Summary was printed and given to the patient.  FOLLOW UP: Return in about 2 weeks (around 10/16/2022) for f/u HTN.  Signed:  Crissie Sickles, MD           10/02/2022

## 2022-10-15 ENCOUNTER — Ambulatory Visit (INDEPENDENT_AMBULATORY_CARE_PROVIDER_SITE_OTHER): Payer: Medicare HMO | Admitting: Family Medicine

## 2022-10-15 ENCOUNTER — Encounter: Payer: Self-pay | Admitting: Family Medicine

## 2022-10-15 VITALS — BP 111/55 | HR 56 | Temp 97.7°F | Ht 67.0 in | Wt 162.4 lb

## 2022-10-15 DIAGNOSIS — M7918 Myalgia, other site: Secondary | ICD-10-CM

## 2022-10-15 DIAGNOSIS — I10 Essential (primary) hypertension: Secondary | ICD-10-CM

## 2022-10-15 MED ORDER — AMLODIPINE BESYLATE 10 MG PO TABS: 10.0000 mg | ORAL_TABLET | Freq: Every day | ORAL | 1 refills | Status: DC

## 2022-10-15 NOTE — Progress Notes (Signed)
OFFICE VISIT  10/15/2022  CC:  Chief Complaint  Patient presents with   Medical Management of Chronic Issues    Hypertension    Patient is a 82 y.o. female who presents for 2-week follow-up uncontrolled hypertension. A/P as of last visit: "#1 uncontrolled hypertension in the setting of chronic renal insufficiency stage III. Increase amlodipine to 10 mg daily and continue losartan 100 mg daily. Continue home blood pressure monitoring and return to review these with me in 2 weeks."  INTERIM HX: Bethany Grant is doing well overall.  She does feel little tired. No side effects from the increase in amlodipine.  Home bp's reviewed: 128-157, avg 145.  Diast 60s.  HR 60s.  Having some focal pain in the right scapular area in the last couple of weeks. Hurts to take a deep breath.  She is not coughing or having any shortness of breath.  No nausea.  Eating does not affect it.  She has taken Tylenol each of the last 2 days and it does help some. No recent injury.    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   Pulmonary nodule    followed by Dr. Halford Chessman   Rheumatoid arthritis(714.0)    hands and feet   Right knee pain    ortho->inj summer 2023 helpful   Thrombocytopenia ([place])    mild, chronic    Past Surgical History:  Procedure Laterality Date   ABDOMINAL HYSTERECTOMY  1991   TAH/BSO   BARTHOLIN GLAND CYST EXCISION  1980   RIGHT   BREAST SURGERY  1988   BREAST CYST   COLONOSCOPY N/A 08/24/2013   Procedure: COLONOSCOPY;  Surgeon: Rogene Houston, MD;  [facility];  Service: Endoscopy;  Laterality: N/A;   100   COLONOSCOPY WITH PROPOFOL N/A 09/09/2020   diverticulosis and hemorrhoids, o/w normal.  Procedure: COLONOSCOPY WITH PROPOFOL;  Surgeon: Toledo, Benay Pike, MD;  Location: [facility];  Service: Gastroenterology;  Laterality: N/A;   CT CORONARY CA SCORING  07/2020   29th%tile   ESOPHAGOGASTRODUODENOSCOPY (EGD) WITH PROPOFOL N/A 09/09/2020   gastritis and erythematous duodenopathy. Procedure: ESOPHAGOGASTRODUODENOSCOPY (EGD) WITH PROPOFOL;  Surgeon: Toledo, Benay Pike, MD;  Location: [facility];  Service: Gastroenterology;  Laterality: N/A;   FOOT SURGERY     2 TIMES   FOOT SURGERY Left    HAND SURGERY     HEMORRHOID SURGERY     been over 20 years ago   OLECRANON BURSECTOMY Right 11/29/2013   Procedure: EXCISION OLECRANON BURSA RIGHT ;  Surgeon: Wynonia Sours, MD;  Location: [place];  Service: Orthopedics;  Laterality: Right;   OOPHORECTOMY     BSO   TENOSYNOVECTOMY Right 11/29/2013   Procedure: TENOSYNOVECTOMY FLEXOR TENDON RIGHT WRIST ;  Surgeon: Wynonia Sours, MD;  Location: [place];  Service: Orthopedics;  Laterality: Right;   WEIL OSTEOTOMY Right 08/10/2018   Procedure: WEIL OSTEOTOMY 2, 3,4 METATARSAL RIGHT FOOT, PROXIMAL INTERPHALANGEAL RESECTION TOES 2, 3, 4;  Surgeon: Newt Minion, MD;  Location: [place];  Service: Orthopedics;  Laterality: Right;    Outpatient Medications Prior to Visit  Medication Sig Dispense Refill   ALPRAZolam (XANAX) 0.5 MG tablet 1 tab po qhs 90 tablet 1   Ascorbic Acid (VITAMIN C PO) Take 500 mg by mouth daily.     aspirin EC 81 MG tablet Take 1 tablet (81 mg total) by mouth daily. Swallow whole. 90 tablet 3   Cholecalciferol (VITAMIN D3) 25 MCG (1000 UT) CAPS Take 1,000 Units by mouth daily.     ciclopirox (LOPROX) 0.77 % cream as needed.     Cyanocobalamin (VITAMIN B-12 PO) Take 1,000 mg by mouth daily.     cycloSPORINE (RESTASIS) 0.05 % ophthalmic emulsion Place 1 drop into both eyes 2 (two) times daily.      etanercept (ENBREL SURECLICK) 50 MG/ML injection      losartan (COZAAR) 100 MG tablet Take 1 tablet (100 mg total) by mouth daily. 90 tablet 1   mupirocin ointment (BACTROBAN) 2 % Apply 1 application topically daily. 22 g 0   Naproxen Sodium (ALEVE PO) Take 220 mg by mouth at bedtime.     pantoprazole (PROTONIX) 40 MG tablet Take 1 tablet (40 mg total) by mouth daily. 90 tablet 1   rosuvastatin (CRESTOR) 10 MG tablet TAKE 1 TABLET BY MOUTH EVERY DAY 90 tablet 2   amLODipine (NORVASC) 10 MG tablet Take 1 tablet (10 mg total) by mouth daily. 30 tablet 0   No facility-administered medications prior to visit.    Allergies  Allergen Reactions   Other Nausea And Vomiting and Other (See Comments)    Doesn't do well with pain  Medications, sensitivity to tape   Hydrocodone-Acetaminophen Nausea And Vomiting and Other (See Comments)   Trazodone And Nefazodone Other (See Comments)    Fatigue/hangover effect    Review of Systems As per HPI  PE:    10/15/2022   10:23 AM 10/02/2022   10:07 AM 09/17/2022    1:03 PM  Vitals with BMI  Height 5\' 7"  5\' 7"  5\' 7"   Weight 162 lbs 6 oz 162 lbs 6 oz 158 lbs 6 oz  BMI 25.43 [id number]  Systolic [id number] 0000000 [id number]  Diastolic 55 62 66  Pulse 56 63 54    Physical Exam  Gen: Alert, well appearing.  Patient is oriented to person, place, time, and situation. AFFECT: pleasant, lucid thought and speech. CV: RRR, occ brief pause.   no m/r/g.   LUNGS: CTA bilat, nonlabored resps, good aeration in all lung fields. Back: Mild focal tenderness to palpation over the medial border of the right scapula.  LABS:  Last metabolic panel Lab Results  Component Value Date   GLUCOSE 83 09/17/2022   NA 137 09/17/2022   K 4.7 09/17/2022   CL 99 09/17/2022   CO2 32 09/17/2022   BUN 16 09/17/2022   CREATININE 1.14 09/17/2022   GFRNONAA 59 (L) 08/19/2021   CALCIUM 9.5 09/17/2022   PROT 7.1 08/26/2022   ALBUMIN 4.5 02/23/2022   LABGLOB 3.0 08/19/2021   AGRATIO 1.3  08/19/2021   BILITOT 0.4 08/26/2022   ALKPHOS 95 02/23/2022   AST 20 08/26/2022   ALT 13 08/26/2022   ANIONGAP 10 08/19/2021   IMPRESSION AND PLAN:  #1 hypertension in the setting of chronic renal insufficiency stage III. Systolic control not perfect per home measurements.  Very good blood pressure here today. I am hesitant to put her on a third blood pressure medicine today.  She  agrees with holding off on any new medication at this time. Continue amlodipine 10 mg a day and losartan 100 mg a day.  #2 focal right scapular pain.  Myofascial. Reassured.  Okay to use Tylenol twice a day as needed. Signs/symptoms to call or return for were reviewed and pt expressed understanding.  No labs needed today.  An After Visit Summary was printed and given to the patient.  FOLLOW UP: Return in about 4 months (around 02/14/2023) for annual CPE (fasting).  Signed:  Crissie Sickles, MD           10/15/2022

## 2022-11-06 ENCOUNTER — Telehealth: Payer: Self-pay | Admitting: Family Medicine

## 2022-11-06 NOTE — Telephone Encounter (Signed)
Pt states that for about a week now she has noticed swelling in her hands and ankles since starting Amlodipine. She reports that she did not take it today and her blood pressure was 135/63.  She declined scheduling an appointment at the time of the call. She would like to know if she should continue to take the medication. Please advise patient on what to do.

## 2022-11-06 NOTE — Telephone Encounter (Signed)
Pt advised of recommendations, she is scheduled for Mon 4/14 @ 4pm.

## 2022-11-06 NOTE — Telephone Encounter (Signed)
Please review and advise.

## 2022-11-06 NOTE — Telephone Encounter (Signed)
Reassure her that this is a common side effect on amlodipine.  It is not dangerous and she should continue the medication. If she is hesitant to continue it then she needs to come in and let me examine her.--thx

## 2022-11-09 ENCOUNTER — Encounter: Payer: Self-pay | Admitting: Family Medicine

## 2022-11-09 ENCOUNTER — Ambulatory Visit (INDEPENDENT_AMBULATORY_CARE_PROVIDER_SITE_OTHER): Payer: Medicare HMO | Admitting: Family Medicine

## 2022-11-09 VITALS — BP 128/66 | HR 67 | Ht 67.0 in | Wt 163.8 lb

## 2022-11-09 DIAGNOSIS — N3001 Acute cystitis with hematuria: Secondary | ICD-10-CM

## 2022-11-09 DIAGNOSIS — R300 Dysuria: Secondary | ICD-10-CM

## 2022-11-09 DIAGNOSIS — T50905A Adverse effect of unspecified drugs, medicaments and biological substances, initial encounter: Secondary | ICD-10-CM | POA: Diagnosis not present

## 2022-11-09 DIAGNOSIS — I10 Essential (primary) hypertension: Secondary | ICD-10-CM | POA: Diagnosis not present

## 2022-11-09 LAB — POC URINALSYSI DIPSTICK (AUTOMATED)
Bilirubin, UA: NEGATIVE
Glucose, UA: NEGATIVE
Ketones, UA: NEGATIVE
Nitrite, UA: NEGATIVE
Protein, UA: NEGATIVE
Spec Grav, UA: 1.015 (ref 1.010–1.025)
Urobilinogen, UA: 1 E.U./dL
pH, UA: 6 (ref 5.0–8.0)

## 2022-11-09 MED ORDER — SULFAMETHOXAZOLE-TRIMETHOPRIM 800-160 MG PO TABS: 1.0000 | ORAL_TABLET | Freq: Two times a day (BID) | ORAL | 0 refills | Status: AC

## 2022-11-09 NOTE — Progress Notes (Signed)
OFFICE VISIT  11/09/2022  CC:  Chief Complaint  Patient presents with   Follow-up    Hasn't taken amlopdpine in "a couple of days" Possible UTI onset 11/08/2022 frequency Eye infection-has eye drops     Patient is a 82 y.o. female who presents for legs swelling.  HPI: She stopped her amlodipine a couple of days ago due to feeling tight in her hands and some swelling in ankles and feet.  Since stopping it she has noted complete resolution of the symptoms. Home blood pressure range with 120-149 systolic over 55-67 diastolic.  Heart rate average 60.  For the last 2 days she has had significant burning with urination, urinary urgency, and urinary frequency.  Yesterday she took an Azo tab and she took 1 this morning about 8 hours ago.  No flank pain, blood in urine, abdominal pain, nausea, or fever. No headaches, no chest pain, no dizziness, no vision abnormalities.  Past Medical History:  Diagnosis Date   Anxiety    Bradycardia    Sinus bradycardia with right bundle but block and LAFB-->avoid BBs and CCBs   Chronic renal insufficiency, stage 3 (moderate)    GFR 40s   Emphysema lung    noted on CT scanning   GERD (gastroesophageal reflux disease)    Hypercholesterolemia    Hypertension    Insomnia    Osteopenia 09/2017   T score -2.1 FRAX 23% and 8.3% (has RA).   Pulmonary nodule    followed by Dr. Craige Cotta   Rheumatoid arthritis(714.0)    hands and feet   Right knee pain    ortho->inj summer 2023 helpful   Thrombocytopenia    mild, chronic    Past Surgical History:  Procedure Laterality Date   ABDOMINAL HYSTERECTOMY  1991   TAH/BSO   BARTHOLIN GLAND CYST EXCISION  1980   RIGHT   BREAST SURGERY  1988   BREAST CYST   COLONOSCOPY N/A 08/24/2013   Procedure: COLONOSCOPY;  Surgeon: Malissa Hippo, MD;  [facility]: [facility];  Service: Endoscopy;  Laterality: N/A;  100   COLONOSCOPY WITH PROPOFOL N/A 09/09/2020   diverticulosis and hemorrhoids, o/w normal.  Procedure:  COLONOSCOPY WITH PROPOFOL;  Surgeon: Toledo, Boykin Nearing, MD;  Location: [facility];  Service: Gastroenterology;  Laterality: N/A;   CT CORONARY CA SCORING  07/2020   29th%tile   ESOPHAGOGASTRODUODENOSCOPY (EGD) WITH PROPOFOL N/A 09/09/2020   gastritis and erythematous duodenopathy. Procedure: ESOPHAGOGASTRODUODENOSCOPY (EGD) WITH PROPOFOL;  Surgeon: Toledo, Boykin Nearing, MD;  Location: [facility];  Service: Gastroenterology;  Laterality: N/A;   FOOT SURGERY     2 TIMES   FOOT SURGERY Left    HAND SURGERY     HEMORRHOID SURGERY     been over 20 years ago   OLECRANON BURSECTOMY Right 11/29/2013   Procedure: EXCISION OLECRANON BURSA RIGHT ;  Surgeon: Nicki Reaper, MD;  Location: [facility];  Service: Orthopedics;  Laterality: Right;   OOPHORECTOMY     BSO   TENOSYNOVECTOMY Right 11/29/2013   Procedure: TENOSYNOVECTOMY FLEXOR TENDON RIGHT WRIST ;  Surgeon: Nicki Reaper, MD;  Location: [facility];  Service: Orthopedics;  Laterality: Right;   WEIL OSTEOTOMY Right 08/10/2018   Procedure: WEIL OSTEOTOMY 2, 3,4 METATARSAL RIGHT FOOT, PROXIMAL INTERPHALANGEAL RESECTION TOES 2, 3, 4;  Surgeon: Nadara Mustard, MD;  Location: [facility];  Service: Orthopedics;  Laterality: Right;    Outpatient Medications Prior to Visit  Medication Sig Dispense Refill   ALPRAZolam (  XANAX) 0.5 MG tablet 1 tab po qhs 90 tablet 1   Ascorbic Acid (VITAMIN C PO) Take 500 mg by mouth daily.     aspirin EC 81 MG tablet Take 1 tablet (81 mg total) by mouth daily. Swallow whole. 90 tablet 3   Cholecalciferol (VITAMIN D3) 25 MCG (1000 UT) CAPS Take 1,000 Units by mouth daily.     ciclopirox (LOPROX) 0.77 % cream as needed.     Cyanocobalamin (VITAMIN B-12 PO) Take 1,000 mg by mouth daily.     cycloSPORINE (RESTASIS) 0.05 % ophthalmic emulsion Place 1 drop into both eyes 2 (two) times daily.     etanercept (ENBREL SURECLICK) 50 MG/ML injection      losartan (COZAAR) 100 MG tablet Take 1 tablet  (100 mg total) by mouth daily. 90 tablet 1   mupirocin ointment (BACTROBAN) 2 % Apply 1 application topically daily. 22 g 0   Naproxen Sodium (ALEVE PO) Take 220 mg by mouth at bedtime.     pantoprazole (PROTONIX) 40 MG tablet Take 1 tablet (40 mg total) by mouth daily. 90 tablet 1   rosuvastatin (CRESTOR) 10 MG tablet TAKE 1 TABLET BY MOUTH EVERY DAY 90 tablet 2   amLODipine (NORVASC) 10 MG tablet Take 1 tablet (10 mg total) by mouth daily. (Patient not taking: Reported on 11/09/2022) 90 tablet 1   No facility-administered medications prior to visit.    Allergies  Allergen Reactions   Other Nausea And Vomiting and Other (See Comments)    Doesn't do well with pain  Medications, sensitivity to tape   Hydrocodone-Acetaminophen Nausea And Vomiting and Other (See Comments)   Trazodone And Nefazodone Other (See Comments)    Fatigue/hangover effect    Review of Systems  As per HPI  PE:    11/09/2022    3:38 PM 10/15/2022   10:23 AM 10/02/2022   10:07 AM  Vitals with BMI  Height 5\' 7"  5\' 7"  5\' 7"   Weight 163 lbs 13 oz 162 lbs 6 oz 162 lbs 6 oz  BMI 25.65 25.43 25.43  Systolic 128 111 827  Diastolic 66 55 62  Pulse 67 56 63   Physical Exam  Gen: Alert, well appearing.  Patient is oriented to person, place, time, and situation. AFFECT: pleasant, lucid thought and speech. Hands and feet without any swelling/edema.  LABS:  Last metabolic panel Lab Results  Component Value Date   GLUCOSE 83 09/17/2022   NA 137 09/17/2022   K 4.7 09/17/2022   CL 99 09/17/2022   CO2 32 09/17/2022   BUN 16 09/17/2022   CREATININE 1.14 09/17/2022   GFRNONAA 59 (L) 08/19/2021   CALCIUM 9.5 09/17/2022   PROT 7.1 08/26/2022   ALBUMIN 4.5 02/23/2022   LABGLOB 3.0 08/19/2021   AGRATIO 1.3 08/19/2021   BILITOT 0.4 08/26/2022   ALKPHOS 95 02/23/2022   AST 20 08/26/2022   ALT 13 08/26/2022   ANIONGAP 10 08/19/2021   IMPRESSION AND PLAN:  #1 hypertension.  She has had some measurements at home  well within normal and is normal today off amlodipine.  I would leave things alone right now--continue only losartan 100 mg a day.  #2 medication side effects--hands and feet swelling on amlodipine.  Resolved with discontinuation of this medication.  #3 UTI. UA today: Trace blood, 2+ LEU, o/w normal (light yellow, no odor). Clx sent. Bactrim double strength, 1 twice daily x 3 days.  An After Visit Summary was printed and given to the patient.  FOLLOW UP: Return for keep appt already set for this July. Appt set for 02/16/23  Signed:  Santiago Bumpers, MD           11/09/2022

## 2022-11-11 LAB — URINE CULTURE
MICRO NUMBER:: 14825274
SPECIMEN QUALITY:: ADEQUATE

## 2023-02-05 ENCOUNTER — Other Ambulatory Visit: Payer: Self-pay | Admitting: Physician Assistant

## 2023-02-05 NOTE — Telephone Encounter (Signed)
Patient of Dr. Skains. Please review for refill. Thank you!  

## 2023-02-16 ENCOUNTER — Ambulatory Visit (INDEPENDENT_AMBULATORY_CARE_PROVIDER_SITE_OTHER): Payer: Medicare HMO | Admitting: Family Medicine

## 2023-02-16 ENCOUNTER — Encounter: Payer: Self-pay | Admitting: Family Medicine

## 2023-02-16 VITALS — BP 132/82 | HR 54 | Ht 67.0 in | Wt 158.2 lb

## 2023-02-16 DIAGNOSIS — Z79899 Other long term (current) drug therapy: Secondary | ICD-10-CM

## 2023-02-16 DIAGNOSIS — I10 Essential (primary) hypertension: Secondary | ICD-10-CM

## 2023-02-16 DIAGNOSIS — E7800 Pure hypercholesterolemia, unspecified: Secondary | ICD-10-CM

## 2023-02-16 DIAGNOSIS — Z0000 Encounter for general adult medical examination without abnormal findings: Secondary | ICD-10-CM

## 2023-02-16 DIAGNOSIS — F5101 Primary insomnia: Secondary | ICD-10-CM | POA: Diagnosis not present

## 2023-02-16 DIAGNOSIS — N1831 Chronic kidney disease, stage 3a: Secondary | ICD-10-CM

## 2023-02-16 LAB — CBC
Hemoglobin: 13.9 g/dL (ref 11.7–15.5)
MCH: 27.9 pg (ref 27.0–33.0)

## 2023-02-16 MED ORDER — LOSARTAN POTASSIUM 100 MG PO TABS: 100.0000 mg | ORAL_TABLET | Freq: Every day | ORAL | 1 refills | Status: DC

## 2023-02-16 MED ORDER — ALPRAZOLAM 0.5 MG PO TABS: ORAL_TABLET | ORAL | 1 refills | Status: DC

## 2023-02-16 MED ORDER — PANTOPRAZOLE SODIUM 40 MG PO TBEC: 40.0000 mg | DELAYED_RELEASE_TABLET | Freq: Every day | ORAL | 1 refills | Status: DC

## 2023-02-16 NOTE — Patient Instructions (Signed)

## 2023-02-16 NOTE — Progress Notes (Signed)
Office Note 02/16/2023  CC:  Chief Complaint  Patient presents with   Annual Exam    Pt is fasting.    Patient is a 82 y.o. female who is here for annual health maintenance exam and 46-month follow-up hypertension, hyperlipidemia, and chronic insomnia. A/P as of last visit: "#1 hypertension.  She has had some measurements at home well within normal and is normal today off amlodipine.  I would leave things alone right now--continue only losartan 100 mg a day.   #2 medication side effects--hands and feet swelling on amlodipine.  Resolved with discontinuation of this medication.   #3 UTI. UA today: Trace blood, 2+ LEU, o/w normal (light yellow, no odor). Clx sent. Bactrim double strength, 1 twice daily x 3 days.  INTERIM HX: Feeling well.  Home bp's 130/80 avg, HR 60s  She remains on embrel weekly. Rarely requires tylenol. No nsaids.  Taking 1 xanax nightly and this helps well with insomnia. PMP AWARE reviewed today: most recent rx for alprazolam 0.5 mg was filled 12/14/2022, # 90, rx by me. No red flags.   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   Pulmonary nodule    followed by Dr. Craige Cotta   Rheumatoid arthritis(714.0)    hands and feet   Right knee pain    ortho->inj summer 2023 helpful   Thrombocytopenia (HCC)    mild, chronic    Past Surgical History:  Procedure Laterality Date   ABDOMINAL HYSTERECTOMY  1991   TAH/BSO   BARTHOLIN GLAND CYST EXCISION  1980   RIGHT   BREAST SURGERY  1988   BREAST CYST   COLONOSCOPY N/A 08/24/2013   Procedure: COLONOSCOPY;  Surgeon: Malissa Hippo,  MD;  [facility]: [facility];  Service: Endoscopy;  Laterality: N/A;  100   COLONOSCOPY WITH PROPOFOL N/A 09/09/2020   diverticulosis and hemorrhoids, o/w normal.  Procedure: COLONOSCOPY WITH PROPOFOL;  Surgeon: Toledo, Boykin Nearing, MD;  Location: [facility];  Service: Gastroenterology;  Laterality: N/A;   CT CORONARY CA SCORING  07/2020   29th%tile   ESOPHAGOGASTRODUODENOSCOPY (EGD) WITH PROPOFOL N/A 09/09/2020   gastritis and erythematous duodenopathy. Procedure: ESOPHAGOGASTRODUODENOSCOPY (EGD) WITH PROPOFOL;  Surgeon: Toledo, Boykin Nearing, MD;  Location: [facility];  Service: Gastroenterology;  Laterality: N/A;   FOOT SURGERY     2 TIMES   FOOT SURGERY Left    HAND SURGERY     HEMORRHOID SURGERY     been over 20 years ago   OLECRANON BURSECTOMY Right 11/29/2013   Procedure: EXCISION OLECRANON BURSA RIGHT ;  Surgeon: Nicki Reaper, MD;  Location: [facility];  Service: Orthopedics;  Laterality: Right;   OOPHORECTOMY     BSO   TENOSYNOVECTOMY Right 11/29/2013   Procedure: TENOSYNOVECTOMY FLEXOR TENDON RIGHT WRIST ;  Surgeon: Nicki Reaper, MD;  Location: [facility];  Service: Orthopedics;  Laterality: Right;   WEIL OSTEOTOMY Right 08/10/2018   Procedure: WEIL OSTEOTOMY 2, 3,4 METATARSAL RIGHT FOOT, PROXIMAL INTERPHALANGEAL RESECTION TOES 2, 3, 4;  Surgeon:  Nadara Mustard, MD;  Location: [facility];  Service: Orthopedics;  Laterality: Right;    Family History  Problem Relation Age of Onset   Cancer Mother        LUNG   Hypertension Sister    Cancer Sister        LUNG, THYROID, UTERINE   Cancer Brother        LUNG   Cancer Sister        Lung cancer   Colon cancer Neg Hx     Social History   Socioeconomic History   Marital status: Married    Spouse name: Not on file   Number of children: Not on file   Years of education: Not on file   Highest education level: Not on file  Occupational History   Not on file  Tobacco Use   Smoking status: Former     Current packs/day: 0.00    Average packs/day: 0.8 packs/day for 30.0 years (24.0 ttl pk-yrs)    Types: Cigarettes    Start date: 07/27/1974    Quit date: 07/27/2004    Years since quitting: 18.5   Smokeless tobacco: Never  Vaping Use   Vaping status: Never Used  Substance and Sexual Activity   Alcohol use: No    Alcohol/week: 0.0 standard drinks of alcohol   Drug use: No   Sexual activity: Not Currently    Birth control/protection: Surgical    Comment: 1st intercourse 82 yo-Fewer than 5 partners  Other Topics Concern   Not on file  Social History Narrative   Married   Educ: HS   Occup:   24 pack-yr smoking hx, quit approx 2005   No alc   Social Determinants of Health   Financial Resource Strain: Not on file  Food Insecurity: Not on file  Transportation Needs: Not on file  Physical Activity: Not on file  Stress: Not on file  Social Connections: Not on file  Intimate Partner Violence: Not on file    Outpatient Medications Prior to Visit  Medication Sig Dispense Refill   Ascorbic Acid (VITAMIN C PO) Take 500 mg by mouth daily.     aspirin EC 81 MG tablet Take 1 tablet (81 mg total) by mouth daily. Swallow whole. 90 tablet 3   Cholecalciferol (VITAMIN D3) 25 MCG (1000 UT) CAPS Take 1,000 Units by mouth daily.     ciclopirox (LOPROX) 0.77 % cream as needed.     Cyanocobalamin (VITAMIN B-12 PO) Take 1,000 mg by mouth daily.     cycloSPORINE (RESTASIS) 0.05 % ophthalmic emulsion Place 1 drop into both eyes 2 (two) times daily.     etanercept (ENBREL SURECLICK) 50 MG/ML injection      mupirocin ointment (BACTROBAN) 2 % Apply 1 application topically daily. 22 g 0   Naproxen Sodium (ALEVE PO) Take 220 mg by mouth at bedtime.     rosuvastatin (CRESTOR) 10 MG tablet TAKE 1 TABLET BY MOUTH EVERY DAY 30 tablet 0   ALPRAZolam (XANAX) 0.5 MG tablet 1 tab po qhs 90 tablet 1   losartan (COZAAR) 100 MG tablet Take 1 tablet (100 mg total) by mouth daily. 90 tablet 1   pantoprazole  (PROTONIX) 40 MG tablet Take 1 tablet (40 mg total) by mouth daily. 90 tablet 1   No facility-administered medications prior to visit.    Allergies  Allergen Reactions   Other Nausea And Vomiting and Other (See Comments)    Doesn't do well with pain  Medications, sensitivity  to tape   Hydrocodone-Acetaminophen Nausea And Vomiting and Other (See Comments)   Trazodone And Nefazodone Other (See Comments)    Fatigue/hangover effect    Review of Systems  Constitutional:  Negative for appetite change, chills, fatigue and fever.  HENT:  Negative for congestion, dental problem, ear pain and sore throat.   Eyes:  Negative for discharge, redness and visual disturbance.  Respiratory:  Negative for cough, chest tightness, shortness of breath and wheezing.   Cardiovascular:  Negative for chest pain, palpitations and leg swelling.  Gastrointestinal:  Negative for abdominal pain, blood in stool, diarrhea, nausea and vomiting.  Genitourinary:  Negative for difficulty urinating, dysuria, flank pain, frequency, hematuria and urgency.  Musculoskeletal:  Negative for arthralgias, back pain, joint swelling, myalgias and neck stiffness.  Skin:  Negative for pallor and rash.  Neurological:  Negative for dizziness, speech difficulty, weakness and headaches.  Hematological:  Negative for adenopathy. Does not bruise/bleed easily.  Psychiatric/Behavioral:  Negative for confusion and sleep disturbance. The patient is not nervous/anxious.     PE;    02/16/2023    8:52 AM 02/16/2023    8:44 AM 11/09/2022    3:38 PM  Vitals with BMI  Height  5\' 7"  5\' 7"   Weight  158 lbs 3 oz 163 lbs 13 oz  BMI  24.77 25.65  Systolic 132 149 191  Diastolic 82 81 66  Pulse  54 67   Gen: Alert, well appearing.  Patient is oriented to person, place, time, and situation. AFFECT: pleasant, lucid thought and speech. ENT: Ears: EACs clear, normal epithelium.  TMs with good light reflex and landmarks bilaterally.  Eyes: no  injection, icteris, swelling, or exudate.  EOMI, PERRLA. Nose: no drainage or turbinate edema/swelling.  No injection or focal lesion.  Mouth: lips without lesion/swelling.  Oral mucosa pink and moist.  Dentition intact and without obvious caries or gingival swelling.  Oropharynx without erythema, exudate, or swelling.  Neck: supple/nontender.  No LAD, mass, or TM.  Carotid pulses 2+ bilaterally, without bruits. CV: RRR, no m/r/g.   LUNGS: CTA bilat, nonlabored resps, good aeration in all lung fields. ABD: soft, NT, ND, BS normal.  No hepatospenomegaly or mass.  No bruits. EXT: no clubbing, cyanosis, or edema.  Musculoskeletal: no joint swelling, erythema, warmth, or tenderness.  ROM of all joints intact. Skin - no sores or suspicious lesions or rashes or color changes  Pertinent labs:  Lab Results  Component Value Date   TSH 2.128 06/26/2020   Lab Results  Component Value Date   WBC 4.5 02/23/2022   HGB 13.6 02/23/2022   HCT 42.4 02/23/2022   MCV 90.0 02/23/2022   PLT 111.0 (L) 02/23/2022   Lab Results  Component Value Date   CREATININE 1.14 09/17/2022   BUN 16 09/17/2022   NA 137 09/17/2022   K 4.7 09/17/2022   CL 99 09/17/2022   CO2 32 09/17/2022   Lab Results  Component Value Date   ALT 13 08/26/2022   AST 20 08/26/2022   ALKPHOS 95 02/23/2022   BILITOT 0.4 08/26/2022   Lab Results  Component Value Date   CHOL 122 08/26/2022   Lab Results  Component Value Date   HDL 54 08/26/2022   Lab Results  Component Value Date   LDLCALC 51 08/26/2022   Lab Results  Component Value Date   TRIG 85 08/26/2022   Lab Results  Component Value Date   CHOLHDL 2.3 08/26/2022   ASSESSMENT AND PLAN:  1) Health maintenance exam: Reviewed age and gender appropriate health maintenance issues (prudent diet, regular exercise, health risks of tobacco and excessive alcohol, use of seatbelts, fire alarms in home, use of sunscreen).  Also reviewed age and gender appropriate health  screening as well as vaccine recommendations. Vaccines: ALL UTD Labs: cbc, cmet, flp Cervical ca screening: History of hysterectomy for benign diagnosis. Breast ca screening: Next mammography due 07/2023. Colon ca screening: Colonoscopy 09/09/2022, no polyps.  No further screening indicated.  2) HTN, well controlled on losartan 100 every day. Lytes/cr today.  #3 anxiety related insomnia. Will with long-term nightly use of alprazolam 0.5 mg. #90, 1 refill today. Urine drug screen today.  Controlled substance contract up-to-date.  #4 hyperlipidemia, doing well on rosuvastatin 10 mg a day. Lipid panel today.  #5 CRI III. She avoids NSAIDs. Lytes/cr today.  An After Visit Summary was printed and given to the patient.  FOLLOW UP:  Return in about 3 months (around 05/19/2023) for routine chronic illness f/u.  Signed:  Santiago Bumpers, MD           02/16/2023

## 2023-02-17 LAB — LIPID PANEL
Cholesterol: 132 mg/dL (ref <200)
HDL: 59 mg/dL (ref >=50)
LDL Cholesterol (Calc): 59 mg/dL (calc)
Non-HDL Cholesterol (Calc): 73 mg/dL (calc) (ref <130)
Total CHOL/HDL Ratio: 2.2 (calc) (ref <5.0)
Triglycerides: 64 mg/dL (ref <150)

## 2023-02-17 LAB — COMPREHENSIVE METABOLIC PANEL
AG Ratio: 1.5 (calc) (ref 1.0–2.5)
ALT: 13 U/L (ref 6–29)
AST: 20 U/L (ref 10–35)
Albumin: 4.4 g/dL (ref 3.6–5.1)
Alkaline phosphatase (APISO): 84 U/L (ref 37–153)
BUN/Creatinine Ratio: 13 (calc) (ref 6–22)
BUN: 14 mg/dL (ref 7–25)
CO2: 28 mmol/L (ref 20–32)
Calcium: 9.3 mg/dL (ref 8.6–10.4)
Chloride: 101 mmol/L (ref 98–110)
Creat: 1.12 mg/dL — ABNORMAL HIGH (ref 0.60–1.00)
Globulin: 3 g/dL (calc) (ref 1.9–3.7)
Glucose, Bld: 87 mg/dL (ref 65–99)
Potassium: 4.8 mmol/L (ref 3.5–5.3)
Sodium: 139 mmol/L (ref 135–146)
Total Bilirubin: 0.5 mg/dL (ref 0.2–1.2)
Total Protein: 7.4 g/dL (ref 6.1–8.1)

## 2023-02-17 LAB — CBC
HCT: 43.3 % (ref 35.0–45.0)
MCHC: 32.1 g/dL (ref 32.0–36.0)
MCV: 86.9 fL (ref 80.0–100.0)
MPV: 12.1 fL (ref 7.5–12.5)
Platelets: 141 10*3/uL (ref 140–400)
RBC: 4.98 10*6/uL (ref 3.80–5.10)
RDW: 12.1 % (ref 11.0–15.0)
WBC: 6 10*3/uL (ref 3.8–10.8)

## 2023-03-08 ENCOUNTER — Ambulatory Visit: Payer: Medicare HMO | Attending: Cardiology | Admitting: Cardiology

## 2023-03-08 ENCOUNTER — Encounter: Payer: Self-pay | Admitting: Cardiology

## 2023-03-08 VITALS — BP 138/72 | HR 64 | Ht 67.0 in | Wt 158.2 lb

## 2023-03-08 DIAGNOSIS — I2510 Atherosclerotic heart disease of native coronary artery without angina pectoris: Secondary | ICD-10-CM

## 2023-03-08 DIAGNOSIS — I4510 Unspecified right bundle-branch block: Secondary | ICD-10-CM | POA: Diagnosis not present

## 2023-03-08 DIAGNOSIS — I444 Left anterior fascicular block: Secondary | ICD-10-CM | POA: Diagnosis not present

## 2023-03-08 NOTE — Progress Notes (Signed)
  Cardiology Office Note:  .   Date:  03/08/2023  ID:  Bethany Grant, DOB 01/04/1941, MRN [id number] PCP: Jeoffrey Massed, MD  [facility] Providers Cardiologist:  Donato Schultz, MD    History of Present Illness: .   Bethany Grant is a 82 y.o. female Discussed the use of AI scribe software for clinical note transcription with the patient, who gave verbal consent to proceed.  History of Present Illness   Has nonobstructive coronary artery disease, hypertension, hyperlipidemia, and rheumatoid arthritis, presents for a routine follow-up. She reports no chest pain, shortness of breath, fainting, or bleeding. Her only complaint is arthritis, which has been a long-standing issue. She is currently on Enbrel for rheumatoid arthritis management, which she finds indispensable. She also mentions that her blood pressure medication, losartan, was recently increased from 50mg  to 100mg  due to a sudden increase in blood pressure. The patient maintains an active lifestyle, working in her garden, and follows a predominantly vegetable-based diet.        Studies Reviewed: Marland Kitchen   EKG Interpretation Date/Time:  Monday March 08 2023 09:00:13 EDT Ventricular Rate:  64 PR Interval:  154 QRS Duration:  132 QT Interval:  438 QTC Calculation: 451 R Axis:   -84  Text Interpretation: Normal sinus rhythm Right bundle branch block Left anterior fascicular block Bifascicular block  When compared with ECG of 26-Jun-2020 12:05, No significant change since last tracing Confirmed by Donato Schultz ([id number]) on 03/08/2023 9:13:05 AM     LABS LDL: 59 (02/16/2023) Creatinine: 1.1 Hemoglobin: 13.9  RADIOLOGY Coronary CT: Calcium score of 12.3, 29th percentile, small focal proximal LAD plaque of 0-24%  DIAGNOSTIC EKG: Bifascicular block (03/08/2023) Risk Assessment/Calculations:            Physical Exam:   VS:  BP 138/72   Pulse 64   Ht 5\' 7"  (1.702 m)   Wt 158 lb 3.2 oz (71.8 kg)   LMP  (LMP Unknown)   SpO2  98%   BMI 24.78 kg/m    Wt Readings from Last 3 Encounters:  03/08/23 158 lb 3.2 oz (71.8 kg)  02/16/23 158 lb 3.2 oz (71.8 kg)  11/09/22 163 lb 12.8 oz (74.3 kg)    GEN: Well nourished, well developed in no acute distress NECK: No JVD; No carotid bruits CARDIAC: IRRR, no murmurs, rubs, gallops RESPIRATORY:  Clear to auscultation without rales, wheezing or rhonchi  ABDOMEN: Soft, non-tender, non-distended EXTREMITIES:  No edema; No deformity   ASSESSMENT AND PLAN: .   Assessment and Plan    Nonobstructive Coronary Artery Disease No chest pain or shortness of breath. Cardiac CT showed minimal plaque in the proximal LAD. -Continue Aspirin 81mg  daily.  Hypertension Blood pressure controlled on Losartan 100mg  daily. -Continue Losartan 100mg  daily.  Hyperlipidemia LDL at goal (59 on 02/16/2023) on Crestor 10mg  daily. -Continue Crestor 10mg  daily.  Rheumatoid Arthritis Chronic condition managed with Enbrel. -Continue Enbrel as prescribed.  Bifascicular Block No symptoms. EKG shows stable bifascicular block. -No changes to current management.  Follow-up in 1 year. Contact office if any difficulties arise.              Signed, Donato Schultz, MD

## 2023-03-08 NOTE — Patient Instructions (Signed)

## 2023-03-10 ENCOUNTER — Telehealth: Payer: Medicare HMO | Admitting: Nurse Practitioner

## 2023-03-10 ENCOUNTER — Encounter: Payer: Self-pay | Admitting: Nurse Practitioner

## 2023-03-10 VITALS — Ht 67.0 in | Wt 150.0 lb

## 2023-03-10 DIAGNOSIS — U071 COVID-19: Secondary | ICD-10-CM | POA: Diagnosis not present

## 2023-03-10 MED ORDER — NIRMATRELVIR/RITONAVIR (PAXLOVID) TABLET (RENAL DOSING): 2.0000 | ORAL_TABLET | Freq: Two times a day (BID) | ORAL | 0 refills | Status: AC

## 2023-03-10 NOTE — Patient Instructions (Signed)
It was great to see you!  Start Paxlovid 2 capsules twice a day for 5 days.  Drink plenty of fluids and get rest.  You can take Tylenol as needed for pain or fever.  Wear a mask if you need to go out in public for 10 days, however try to limit that as much as possible  Let's follow-up if your symptoms worsen or don't improve.   Take care,  Rodman Pickle, NP

## 2023-03-10 NOTE — Assessment & Plan Note (Signed)
Symptoms started today.  Will treat with renal dosing of Paxlovid 2 capsules twice a day since her GFR is 51.  Discussed possible side effects.  Encouraged rest, fluids.  Can continue Tylenol as needed for pain or fever.  Reviewed home care instructions for COVID. Advised self-isolation at home for at least 5 days. After 5 days, if improved and fever resolved, can be in public, but should wear a mask around others for an additional 5 days. If symptoms, esp, dyspnea develops/worsens, recommend in-person evaluation at either an urgent care or the emergency room.

## 2023-03-10 NOTE — Progress Notes (Addendum)
[facility] [address] [place] [address] Dept: [phone] Dept Fax: [phone]  Virtual Video Visit  I connected with Bethany Grant on 03/10/23 at  4:20 PM EDT by a video enabled telemedicine application and verified that I am speaking with the correct person using two identifiers.  Location patient: Home Location provider: Clinic Persons participating in the virtual visit: Patient; Rodman Pickle, NP; Malena Peer, CMA  I discussed the limitations of evaluation and management by telemedicine and the availability of in person appointments. The patient expressed understanding and agreed to proceed.  Chief Complaint  Patient presents with   Covid Positive    Tested positive form home test, exposed by spouse, coughing    SUBJECTIVE:  HPI: Bethany Grant is a 82 y.o. female who presents with cough and positive home covid-19 test. Symptoms started this morning.  UPPER RESPIRATORY TRACT INFECTION  Fever: no Cough: yes Shortness of breath: no Wheezing: no Chest pain: no Chest tightness: no Chest congestion: no Nasal congestion: yes Runny nose: no Post nasal drip: no Sneezing: no Sore throat:  scratchy Swollen glands: no Sinus pressure: no Headache: no Face pain: no Toothache: no Ear pain: no bilateral Ear pressure: no bilateral Eyes red/itching:no Eye drainage/crusting: no  Vomiting: no Rash: no Fatigue: yes Sick contacts: yes Strep contacts: no  Context: stable Recurrent sinusitis: no Relief with OTC cold/cough medications: yes  Treatments attempted: tylenol    Patient Active Problem List   Diagnosis Date Noted   COVID-19 03/10/2023   Lymphopenia 05/06/2021   Leukopenia 05/06/2021   Healthcare maintenance 02/05/2021   CAD (coronary artery disease) 08/08/2020   Thoracic aortic atherosclerosis (HCC) 08/08/2020   Primary insomnia 08/08/2020   Chronic pruritus 07/08/2020   Claw toe,  acquired, right    Cough 04/21/2018   Nasal vestibulitis 04/21/2018   TMJ pain dysfunction syndrome 04/07/2018   Anosmia 03/04/2017   Abnormal EKG 12/20/2014   Other fatigue 12/20/2014   RBBB 12/20/2013   LAFB (left anterior fascicular block) 12/20/2013   Bifascicular block 12/20/2013   RA (rheumatoid arthritis) (HCC) 12/20/2013   Rheumatoid lung disease (HCC) 10/12/2013   Osteopenia 06/14/2013   Hypertension    RHINITIS 02/25/2009   ARTHRITIS, RHEUMATOID 05/03/2008    Past Surgical History:  Procedure Laterality Date   ABDOMINAL HYSTERECTOMY  1991   TAH/BSO   BARTHOLIN GLAND CYST EXCISION  1980   RIGHT   BREAST SURGERY  1988   BREAST CYST   COLONOSCOPY N/A 08/24/2013   Procedure: COLONOSCOPY;  Surgeon: Malissa Hippo, MD;  [facility];  Service: Endoscopy;  Laterality: N/A;  100   COLONOSCOPY WITH PROPOFOL N/A 09/09/2020   diverticulosis and hemorrhoids, o/w normal.  Procedure: COLONOSCOPY WITH PROPOFOL;  Surgeon: Toledo, Boykin Nearing, MD;  Location: [facility];  Service: Gastroenterology;  Laterality: N/A;   CT CORONARY CA SCORING  07/2020   29th%tile   ESOPHAGOGASTRODUODENOSCOPY (EGD) WITH PROPOFOL N/A 09/09/2020   gastritis and erythematous duodenopathy. Procedure: ESOPHAGOGASTRODUODENOSCOPY (EGD) WITH PROPOFOL;  Surgeon: Toledo, Boykin Nearing, MD;  Location: [facility];  Service: Gastroenterology;  Laterality: N/A;   FOOT SURGERY     2 TIMES   FOOT SURGERY Left    HAND SURGERY     HEMORRHOID SURGERY     been over 20 years ago   OLECRANON BURSECTOMY Right 11/29/2013   Procedure: EXCISION OLECRANON BURSA RIGHT ;  Surgeon: Nicki Reaper, MD;  Location: [facility];  Service:  Orthopedics;  Laterality: Right;   OOPHORECTOMY     BSO   TENOSYNOVECTOMY Right 11/29/2013   Procedure: TENOSYNOVECTOMY FLEXOR TENDON RIGHT WRIST ;  Surgeon: Nicki Reaper, MD;  Location: [facility];  Service: Orthopedics;  Laterality: Right;   WEIL  OSTEOTOMY Right 08/10/2018   Procedure: WEIL OSTEOTOMY 2, 3,4 METATARSAL RIGHT FOOT, PROXIMAL INTERPHALANGEAL RESECTION TOES 2, 3, 4;  Surgeon: Nadara Mustard, MD;  [facility];  Service: Orthopedics;  Laterality: Right;    Family History  Problem Relation Age of Onset   Cancer Mother        LUNG   Hypertension Sister    Cancer Sister        LUNG, THYROID, UTERINE   Cancer Brother        LUNG   Cancer Sister        Lung cancer   Colon cancer Neg Hx     Social History   Tobacco Use   Smoking status: Former    Current packs/day: 0.00    Average packs/day: 0.8 packs/day for 30.0 years (24.0 ttl pk-yrs)    Types: Cigarettes    Start date: 07/27/1974    Quit date: 07/27/2004    Years since quitting: 18.6   Smokeless tobacco: Never  Vaping Use   Vaping status: Never Used  Substance Use Topics   Alcohol use: No    Alcohol/week: 0.0 standard drinks of alcohol   Drug use: No     Current Outpatient Medications:    Acetaminophen (TYLENOL PO), Take by mouth as needed., Disp: , Rfl:    ALPRAZolam (XANAX) 0.5 MG tablet, 1 tab po qhs, Disp: 90 tablet, Rfl: 1   Ascorbic Acid (VITAMIN C PO), Take 500 mg by mouth daily., Disp: , Rfl:    aspirin EC 81 MG tablet, Take 1 tablet (81 mg total) by mouth daily. Swallow whole., Disp: 90 tablet, Rfl: 3   Cholecalciferol (VITAMIN D3) 25 MCG (1000 UT) CAPS, Take 1,000 Units by mouth daily., Disp: , Rfl:    Cyanocobalamin (VITAMIN B-12 PO), Take 1,000 mg by mouth daily., Disp: , Rfl:    cycloSPORINE (RESTASIS) 0.05 % ophthalmic emulsion, Place 1 drop into both eyes 2 (two) times daily., Disp: , Rfl:    etanercept (ENBREL SURECLICK) 50 MG/ML injection, , Disp: , Rfl:    losartan (COZAAR) 100 MG tablet, Take 1 tablet (100 mg total) by mouth daily., Disp: 90 tablet, Rfl: 1   nirmatrelvir/ritonavir, renal dosing, (PAXLOVID) 10 x 150 MG & 10 x 100MG  TABS, Take 2 tablets by mouth 2 (two) times daily for 5 days. (Take nirmatrelvir 150 mg one tablet twice  daily for 5 days and ritonavir 100 mg one tablet twice daily for 5 days) Patient GFR is 51, Disp: 20 tablet, Rfl: 0   pantoprazole (PROTONIX) 40 MG tablet, Take 1 tablet (40 mg total) by mouth daily., Disp: 90 tablet, Rfl: 1   rosuvastatin (CRESTOR) 10 MG tablet, TAKE 1 TABLET BY MOUTH EVERY DAY, Disp: 30 tablet, Rfl: 0   ciclopirox (LOPROX) 0.77 % cream, as needed. (Patient not taking: Reported on 03/10/2023), Disp: , Rfl:    mupirocin ointment (BACTROBAN) 2 %, Apply 1 application topically daily. (Patient not taking: Reported on 03/10/2023), Disp: 22 g, Rfl: 0   Naproxen Sodium (ALEVE PO), Take 220 mg by mouth at bedtime. (Patient not taking: Reported on 03/10/2023), Disp: , Rfl:   Allergies  Allergen Reactions   Other Nausea And Vomiting and Other (See  Comments)    Doesn't do well with pain  Medications, sensitivity to tape   Hydrocodone-Acetaminophen Nausea And Vomiting and Other (See Comments)   Trazodone And Nefazodone Other (See Comments)    Fatigue/hangover effect    ROS: See pertinent positives and negatives per HPI.  OBSERVATIONS/OBJECTIVE:  VITALS per patient if applicable: Today's Vitals   03/10/23 1624  Weight: 150 lb (68 kg)  Height: 5\' 7"  (1.702 m)   Body mass index is 23.49 kg/m.    GENERAL: Alert and oriented. Appears well and in no acute distress.  HEENT: Atraumatic. Conjunctiva clear. No obvious abnormalities on inspection of external nose and ears.  NECK: Normal movements of the head and neck.  LUNGS: On inspection, no signs of respiratory distress. Breathing rate appears normal. No obvious gross SOB, gasping or wheezing, and no conversational dyspnea.  CV: No obvious cyanosis.  MS: Moves all visible extremities without noticeable abnormality.  PSYCH/NEURO: Pleasant and cooperative. No obvious depression or anxiety. Speech and thought processing grossly intact.  ASSESSMENT AND PLAN:  Problem List Items Addressed This Visit       Other   COVID-19 -  Primary    Symptoms started today.  Will treat with renal dosing of Paxlovid 2 capsules twice a day since her GFR is 51.  Discussed possible side effects.  Encouraged rest, fluids.  Can continue Tylenol as needed for pain or fever.  Reviewed home care instructions for COVID. Advised self-isolation at home for at least 5 days. After 5 days, if improved and fever resolved, can be in public, but should wear a mask around others for an additional 5 days. If symptoms, esp, dyspnea develops/worsens, recommend in-person evaluation at either an urgent care or the emergency room.       Relevant Medications   nirmatrelvir/ritonavir, renal dosing, (PAXLOVID) 10 x 150 MG & 10 x 100MG  TABS     I discussed the assessment and treatment plan with the patient. The patient was provided an opportunity to ask questions and all were answered. The patient agreed with the plan and demonstrated an understanding of the instructions.   The patient was advised to call back or seek an in-person evaluation if the symptoms worsen or if the condition fails to improve as anticipated.   Gerre Scull, NP

## 2023-04-12 ENCOUNTER — Ambulatory Visit (HOSPITAL_BASED_OUTPATIENT_CLINIC_OR_DEPARTMENT_OTHER)
Admission: RE | Admit: 2023-04-12 | Discharge: 2023-04-12 | Disposition: A | Discharge status: Home / Self Care | Payer: Medicare HMO | Source: Ambulatory Visit | Attending: Pulmonary Disease | Admitting: Pulmonary Disease

## 2023-04-12 DIAGNOSIS — R911 Solitary pulmonary nodule: Secondary | ICD-10-CM | POA: Diagnosis present

## 2023-04-15 ENCOUNTER — Other Ambulatory Visit: Payer: Self-pay | Admitting: Cardiology

## 2023-04-15 ENCOUNTER — Encounter (HOSPITAL_BASED_OUTPATIENT_CLINIC_OR_DEPARTMENT_OTHER): Payer: Self-pay | Admitting: Pulmonary Disease

## 2023-04-15 ENCOUNTER — Ambulatory Visit (HOSPITAL_BASED_OUTPATIENT_CLINIC_OR_DEPARTMENT_OTHER): Payer: Medicare HMO | Admitting: Pulmonary Disease

## 2023-04-15 VITALS — BP 134/70 | HR 52 | Ht 67.0 in | Wt 155.0 lb

## 2023-04-15 DIAGNOSIS — R911 Solitary pulmonary nodule: Secondary | ICD-10-CM

## 2023-04-15 DIAGNOSIS — J342 Deviated nasal septum: Secondary | ICD-10-CM | POA: Diagnosis not present

## 2023-04-15 DIAGNOSIS — J310 Chronic rhinitis: Secondary | ICD-10-CM

## 2023-04-15 MED ORDER — FLUTICASONE PROPIONATE 50 MCG/ACT NA SUSP: 1.0000 | Freq: Every day | NASAL | 2 refills | Status: AC

## 2023-04-15 NOTE — Progress Notes (Signed)
[facility]  Chief Complaint  Patient presents with   Interstitial Lung Disease    Constitutional:  BP 134/70   Pulse (!) 52   Ht 5\' 7"  (1.702 m)   Wt 155 lb (70.3 kg)   LMP  (LMP Unknown)   SpO2 91%   BMI 24.28 kg/m   Past Medical History:  Endometriosis, HTN, HLD, RA, RBBB, Anxiety  Past Surgical History:  She  has a past surgical history that includes Abdominal hysterectomy (1991); Hemorrhoid surgery; Hand surgery; Foot surgery; Breast surgery (1988); Bartholin gland cyst excision (1980); Oophorectomy; Colonoscopy (N/A, 08/24/2013); Olecranon bursectomy (Right, 11/29/2013); Tenosynovectomy (Right, 11/29/2013); Foot surgery (Left); Weil osteotomy (Right, 08/10/2018); Colonoscopy with propofol (N/A, 09/09/2020); Esophagogastroduodenoscopy (egd) with propofol (N/A, 09/09/2020); and CT CORONARY CA SCORING (07/2020).  Brief Summary:  Bethany Grant is a 82 y.o. female former smoker with lung nodules, family history of lung cancer, and history of rheumatoid arthritis.      Subjective:   My review of CT chest looks stable.  Official report pending.  She has congestion and drainage from her left sinuses.  Has been going on for weeks.  She gags and coughs on drainage.  Has more trouble with smelling things and tasting things.  No fever.  No having pain with chewing or ear pain.  She walks about 1 mile several days per week when the weather is good.  Physical Exam:   Appearance - well kempt   ENMT - deviated septum, TM clear, no oral exudated  Respiratory - equal breath sounds bilaterally, no wheezing or rales  CV - s1s2 regular rate and rhythm, no murmurs  Ext - changes of RA in her hands  Skin - no rashes  Psych - normal mood and affect   Pulmonary testing:  Bronchoscopy Oct 2009>>Pneumatocyte atypia suggestive of obstructive pneumonitis, can be seen with RA in the lungs PFT 06/19/08 >> FEV1 2.58(107%), FEV1% 71, TLC  6.07(111%), DLCO 96%, no BD  Chest Imaging:  CT chest 01/25/08 >> 4 mm Lt lower lobe nodule, hazy reticular infiltrate LLL CT chest 04/18/08 >> increase in LLL reticular infiltrate CT chest 05/23/09 >> 3.8 x 3.6 cm ascending aorta, scoliosis, decreased size LLL nodule, reticular changes LLL decreased Coronary CT chest 08/05/20 >> bullous disease in Lt lung base, b/l mild peripheral GGO CT angio chest 04/24/21 >> atherosclerosis, mild emphysema, stable RUL nodule since 2010, bullous scar LLL CT chest 04/10/22 >> moderate centrilobular and paraseptal emphysema, apical scarring, 1.3 x 0.8 cm RUL nodule stable, 5 mm RUL nodule stable, 1.0 x 0.7 cm LLL nodule stable  Cardiac Tests:  Echo 08/06/20 >> EF 60 to 65%  Social History:  She  reports that she quit smoking about 18 years ago. Her smoking use included cigarettes. She started smoking about 48 years ago. She has a 24 pack-year smoking history. She has never used smokeless tobacco. She reports that she does not drink alcohol and does not use drugs.  Family History:  Her family history includes Cancer in her brother, mother, sister, and sister; Hypertension in her sister.     Assessment/Plan:   History of tobacco abuse, and family history of lung cancer with prior imaging studies showing reticulonodular infiltrates in setting of rheumatoid arthritis, and emphysema. - will call to update her about results of CT chest once official report available - follow up CT chest without contrast in September 2025 - defer follow up PFT or inhaler therapy unless she  develops respiratory symptoms  Chronic rhinitis with deviated nasal septum. - don't think she needs ABx at this time - will have her try nasal irrigation and flonase - if this persists, then she might need ENT assessment  Rheumatoid arthritis.  - she will follow up with Dr. Dierdre Forth with rheumatology  Time Spent Involved in Patient Care on Day of Examination:  35 minutes  Follow up:    Patient Instructions  Will call with official report results from your CT chest scan  Flonase and nasal irrigation nightly for the next one week, then as needed  Follow up in 1 year  Medication List:   Allergies as of 04/15/2023       Reactions   Other Nausea And Vomiting, Other (See Comments)   Doesn't do well with pain  Medications, sensitivity to tape   Hydrocodone-acetaminophen Nausea And Vomiting, Other (See Comments)   Trazodone And Nefazodone Other (See Comments)   Fatigue/hangover effect        Medication List        Accurate as of April 15, 2023  9:28 AM. If you have any questions, ask your nurse or doctor.          STOP taking these medications    ALEVE PO Stopped by: Coralyn Helling   mupirocin ointment 2 % Commonly known as: Bactroban Stopped by: Coralyn Helling       TAKE these medications    ALPRAZolam 0.5 MG tablet Commonly known as: XANAX 1 tab po qhs   aspirin EC 81 MG tablet Take 1 tablet (81 mg total) by mouth daily. Swallow whole.   ciclopirox 0.77 % cream Commonly known as: LOPROX as needed.   cycloSPORINE 0.05 % ophthalmic emulsion Commonly known as: RESTASIS Place 1 drop into both eyes 2 (two) times daily.   Enbrel SureClick 50 MG/ML injection Generic drug: etanercept   fluticasone 50 MCG/ACT nasal spray Commonly known as: FLONASE Place 1 spray into both nostrils daily. Started by: Coralyn Helling   losartan 100 MG tablet Commonly known as: COZAAR Take 1 tablet (100 mg total) by mouth daily.   pantoprazole 40 MG tablet Commonly known as: PROTONIX Take 1 tablet (40 mg total) by mouth daily.   rosuvastatin 10 MG tablet Commonly known as: CRESTOR TAKE 1 TABLET BY MOUTH EVERY DAY   TYLENOL PO Take by mouth as needed.   VITAMIN B-12 PO Take 1,000 mg by mouth daily.   VITAMIN C PO Take 500 mg by mouth daily.   Vitamin D3 25 MCG (1000 UT) Caps Take 1,000 Units by mouth daily.        Signature:  Coralyn Helling,  MD [facility] Pager - [phone] 04/15/2023, 9:28 AM

## 2023-04-15 NOTE — Patient Instructions (Signed)
Will call with official report results from your CT chest scan  Flonase and nasal irrigation nightly for the next one week, then as needed  Follow up in 1 year

## 2023-04-22 ENCOUNTER — Telehealth: Payer: Self-pay | Admitting: Pulmonary Disease

## 2023-04-22 NOTE — Telephone Encounter (Signed)
PT calling for  CT results. Not in yet. She is concerned since Dr. Craige Cotta is leaving she will not get a call from [place]. Please call w/results.

## 2023-04-26 NOTE — Telephone Encounter (Signed)
Pt aware of CT findings. Please see previous question about RSV vaccine.

## 2023-04-26 NOTE — Telephone Encounter (Signed)
She is a candidate for RSV vaccine.

## 2023-04-26 NOTE — Telephone Encounter (Signed)
Pt would like to know if she would benefit from RSV vaccine?

## 2023-04-27 NOTE — Telephone Encounter (Signed)
Informed patient's spouse per VS recommendations about RSV.

## 2023-05-03 ENCOUNTER — Other Ambulatory Visit: Payer: Self-pay | Admitting: Pulmonary Disease

## 2023-05-03 DIAGNOSIS — R918 Other nonspecific abnormal finding of lung field: Secondary | ICD-10-CM

## 2023-05-03 DIAGNOSIS — M0510 Rheumatoid lung disease with rheumatoid arthritis of unspecified site: Secondary | ICD-10-CM

## 2023-05-19 ENCOUNTER — Encounter: Payer: Self-pay | Admitting: Family Medicine

## 2023-05-19 ENCOUNTER — Ambulatory Visit: Payer: Medicare HMO | Admitting: Family Medicine

## 2023-05-19 VITALS — BP 124/68 | HR 50 | Wt 159.8 lb

## 2023-05-19 DIAGNOSIS — N2889 Other specified disorders of kidney and ureter: Secondary | ICD-10-CM

## 2023-05-19 DIAGNOSIS — R6889 Other general symptoms and signs: Secondary | ICD-10-CM | POA: Diagnosis not present

## 2023-05-19 DIAGNOSIS — E7800 Pure hypercholesterolemia, unspecified: Secondary | ICD-10-CM

## 2023-05-19 DIAGNOSIS — L299 Pruritus, unspecified: Secondary | ICD-10-CM

## 2023-05-19 DIAGNOSIS — N1831 Chronic kidney disease, stage 3a: Secondary | ICD-10-CM

## 2023-05-19 DIAGNOSIS — F411 Generalized anxiety disorder: Secondary | ICD-10-CM

## 2023-05-19 DIAGNOSIS — I10 Essential (primary) hypertension: Secondary | ICD-10-CM | POA: Diagnosis not present

## 2023-05-19 DIAGNOSIS — G4701 Insomnia due to medical condition: Secondary | ICD-10-CM

## 2023-05-19 NOTE — Progress Notes (Signed)
OFFICE VISIT  05/19/2023  CC:  Chief Complaint  Patient presents with   Medical Management of Chronic Issues    Pt is inquiring about possibly getting her iron checked; pt c/o constantly being cold.     Patient is a 82 y.o. female who presents for 66-month follow-up hypertension, hyperlipidemia, chronic renal insufficiency stage III, and insomnia. A/P as of last visit: "1) HTN, well controlled on losartan 100 every day. Lytes/cr today.   #2 anxiety related insomnia. Will with long-term nightly use of alprazolam 0.5 mg. #90, 1 refill today. Urine drug screen today.  Controlled substance contract up-to-date.   #3 hyperlipidemia, doing well on rosuvastatin 10 mg a day. Lipid panel today.   #4 CRI III. She avoids NSAIDs. Lytes/cr today."  INTERIM HXBonita Grant feels well. She does have some intermittent itching that occurs without any rash.  Its often around the folds of the armpits and on the upper abdomen under her breasts.  Seems to be anytime of year it can get bad.  She moisturizes but admits not that much.  Describes a fairly long history of feeling cold all the time. Whole body, not just hands and feet. She feels like her diet is excellent as far as leafy greens but she does not eat much meat.  Home blood pressures consistently around 130.  Anxiety well-controlled with alprazolam. She avoids all NSAIDs.  PMP AWARE reviewed today: most recent rx for alprazolam 0.5 mg was filled 03/17/2023, # 90, rx by me. No red flags.  ROS as above, plus--> no fevers, no CP, no SOB, no wheezing, no cough, no dizziness, no HAs, no rashes, no melena/hematochezia.  No polyuria or polydipsia.    No focal weakness, paresthesias, or tremors.  No acute vision or hearing abnormalities.  Nocturia every 2 hours.  No dysuria or unusual/new urinary urgency or frequency.  No recent changes in lower legs. No n/v/d or abd pain.  No palpitations.    Past Medical History:  Diagnosis Date   Anxiety     Bradycardia    Sinus bradycardia with right bundle but block and LAFB-->avoid BBs and CCBs   Chronic renal insufficiency, stage 3 (moderate) (HCC)    GFR 40s   Emphysema lung (HCC)    noted on CT scanning   GERD (gastroesophageal reflux disease)    Hypercholesterolemia    Hypertension    Insomnia    Osteopenia 09/2017   T score -2.1 FRAX 23% and 8.3% (has RA).   Pulmonary nodule    followed by Dr. Craige Cotta   Rheumatoid arthritis(714.0)    hands and feet   Right knee pain    ortho->inj summer 2023 helpful   Thrombocytopenia (HCC)    mild, chronic    Past Surgical History:  Procedure Laterality Date   ABDOMINAL HYSTERECTOMY  1991   TAH/BSO   BARTHOLIN GLAND CYST EXCISION  1980   RIGHT   BREAST SURGERY  1988   BREAST CYST   COLONOSCOPY N/A 08/24/2013   Procedure: COLONOSCOPY;  Surgeon: Malissa Hippo, MD;  [facility];  Service: Endoscopy;  Laterality: N/A;  100   COLONOSCOPY WITH PROPOFOL N/A 09/09/2020   diverticulosis and hemorrhoids, o/w normal.  Procedure: COLONOSCOPY WITH PROPOFOL;  Surgeon: Toledo, Boykin Nearing, MD;  Location: [facility];  Service: Gastroenterology;  Laterality: N/A;   CT CORONARY CA SCORING  07/2020   29th%tile   ESOPHAGOGASTRODUODENOSCOPY (EGD) WITH PROPOFOL N/A 09/09/2020   gastritis and erythematous duodenopathy. Procedure: ESOPHAGOGASTRODUODENOSCOPY (EGD) WITH PROPOFOL;  Surgeon: Toledo, Boykin Nearing, MD;  Location: [facility];  Service: Gastroenterology;  Laterality: N/A;   FOOT SURGERY     2 TIMES   FOOT SURGERY Left    HAND SURGERY     HEMORRHOID SURGERY     been over 20 years ago   OLECRANON BURSECTOMY Right 11/29/2013   Procedure: EXCISION OLECRANON BURSA RIGHT ;  Surgeon: Nicki Reaper, MD;  Location: [facility];  Service: Orthopedics;  Laterality: Right;   OOPHORECTOMY     BSO   TENOSYNOVECTOMY Right 11/29/2013   Procedure: TENOSYNOVECTOMY FLEXOR TENDON RIGHT WRIST ;  Surgeon: Nicki Reaper, MD;  Location: [facility];  Service: Orthopedics;  Laterality: Right;   WEIL OSTEOTOMY Right 08/10/2018   Procedure: WEIL OSTEOTOMY 2, 3,4 METATARSAL RIGHT FOOT, PROXIMAL INTERPHALANGEAL RESECTION TOES 2, 3, 4;  Surgeon: Nadara Mustard, MD;  Location: [facility];  Service: Orthopedics;  Laterality: Right;    Outpatient Medications Prior to Visit  Medication Sig Dispense Refill   Acetaminophen (TYLENOL PO) Take by mouth as needed.     ALPRAZolam (XANAX) 0.5 MG tablet 1 tab po qhs 90 tablet 1   Ascorbic Acid (VITAMIN C PO) Take 500 mg by mouth daily.     aspirin EC 81 MG tablet Take 1 tablet (81 mg total) by mouth daily. Swallow whole. 90 tablet 3   Cholecalciferol (VITAMIN D3) 25 MCG (1000 UT) CAPS Take 1,000 Units by mouth daily.     ciclopirox (LOPROX) 0.77 % cream as needed.     Cyanocobalamin (VITAMIN B-12 PO) Take 1,000 mg by mouth daily.     cycloSPORINE (RESTASIS) 0.05 % ophthalmic emulsion Place 1 drop into both eyes 2 (two) times daily.     etanercept (ENBREL SURECLICK) 50 MG/ML injection      fluticasone (FLONASE) 50 MCG/ACT nasal spray Place 1 spray into both nostrils daily. 16 g 2   losartan (COZAAR) 100 MG tablet Take 1 tablet (100 mg total) by mouth daily. 90 tablet 1   pantoprazole (PROTONIX) 40 MG tablet Take 1 tablet (40 mg total) by mouth daily. 90 tablet 1   rosuvastatin (CRESTOR) 10 MG tablet TAKE 1 TABLET BY MOUTH EVERY DAY 90 tablet 3   No facility-administered medications prior to visit.    Allergies  Allergen Reactions   Other Nausea And Vomiting and Other (See Comments)    Doesn't do well with pain  Medications, sensitivity to tape   Hydrocodone-Acetaminophen Nausea And Vomiting and Other (See Comments)   Trazodone And Nefazodone Other (See Comments)    Fatigue/hangover effect    Review of Systems As per HPI  PE:    05/19/2023    9:17 AM 05/19/2023    9:15 AM 04/15/2023    8:52 AM  Vitals with BMI  Height   5\' 7"   Weight  159 lbs 13 oz 155 lbs  BMI   24.27   Systolic 124 149 244  Diastolic 68 78 70  Pulse  50 52     Physical Exam  Gen: Alert, well appearing.  Patient is oriented to person, place, time, and situation. AFFECT: pleasant, lucid thought and speech. CV: RRR, no m/r/g.   LUNGS: CTA bilat, nonlabored resps, good aeration in all lung fields. EXT: no clubbing or cyanosis.  no edema.    LABS:  Last CBC Lab Results  Component Value Date   WBC 6.0 02/16/2023   HGB 13.9 02/16/2023   HCT 43.3 02/16/2023   MCV  86.9 02/16/2023   MCH 27.9 02/16/2023   RDW 12.1 02/16/2023   PLT 141 02/16/2023   Lab Results  Component Value Date   IRON 87 08/19/2021   TIBC 372 08/19/2021   FERRITIN 25 08/19/2021   Last metabolic panel Lab Results  Component Value Date   GLUCOSE 87 02/16/2023   NA 139 02/16/2023   K 4.8 02/16/2023   CL 101 02/16/2023   CO2 28 02/16/2023   BUN 14 02/16/2023   CREATININE 1.12 (H) 02/16/2023   GFR 45.86 (L) 09/17/2022   CALCIUM 9.3 02/16/2023   PROT 7.4 02/16/2023   ALBUMIN 4.5 02/23/2022   LABGLOB 3.0 08/19/2021   AGRATIO 1.3 08/19/2021   BILITOT 0.5 02/16/2023   ALKPHOS 95 02/23/2022   AST 20 02/16/2023   ALT 13 02/16/2023   ANIONGAP 10 08/19/2021   Last lipids Lab Results  Component Value Date   CHOL 132 02/16/2023   HDL 59 02/16/2023   LDLCALC 59 02/16/2023   TRIG 64 02/16/2023   CHOLHDL 2.2 02/16/2023   Last thyroid functions Lab Results  Component Value Date   TSH 2.128 06/26/2020   Last vitamin D Lab Results  Component Value Date   VD25OH 46 07/01/2015   Last vitamin B12 and Folate Lab Results  Component Value Date   VITAMINB12 543 08/19/2021   FOLATE 66.0 08/19/2021   IMPRESSION AND PLAN:  1) HTN, well controlled on losartan 100 every day. Lytes/cr today.   #2 anxiety related insomnia. Will with long-term nightly use of alprazolam 0.5 mg. No refills needed today. Controlled substance contract and urine drug screen are up-to-date.   #3 hyperlipidemia, doing well  on rosuvastatin 10 mg a day. LDL was 59 about 3 months ago. Plan repeat lipid panel 3 months.   #4 CRI III. She avoids NSAIDs. Lytes/cr today.  #5 cold intolerance, chronic. Check TSH and iron panel.  6.  Pruritus without rash. Encouraged good skin moisturization. Okay to use nonsedating antihistamine as needed.  An After Visit Summary was printed and given to the patient.  FOLLOW UP: Return in about 3 months (around 08/19/2023) for routine chronic illness f/u. Next CPE 01/2024 Signed:  Santiago Bumpers, MD           05/19/2023'

## 2023-05-21 LAB — T4: T4, Total: 7.2 ug/dL (ref 5.1–11.9)

## 2023-05-21 LAB — CBC
HCT: 40.4 % (ref 35.0–45.0)
Hemoglobin: 12.7 g/dL (ref 11.7–15.5)
MCH: 27.8 pg (ref 27.0–33.0)
MCHC: 31.4 g/dL — ABNORMAL LOW (ref 32.0–36.0)
MCV: 88.4 fL (ref 80.0–100.0)
MPV: 12 fL (ref 7.5–12.5)
Platelets: 154 10*3/uL (ref 140–400)
RBC: 4.57 10*6/uL (ref 3.80–5.10)
RDW: 12.6 % (ref 11.0–15.0)
WBC: 5.8 10*3/uL (ref 3.8–10.8)

## 2023-05-21 LAB — BASIC METABOLIC PANEL
BUN/Creatinine Ratio: 12 (calc) (ref 6–22)
BUN: 14 mg/dL (ref 7–25)
CO2: 30 mmol/L (ref 20–32)
Calcium: 9.3 mg/dL (ref 8.6–10.4)
Chloride: 100 mmol/L (ref 98–110)
Creat: 1.17 mg/dL — ABNORMAL HIGH (ref 0.60–1.00)
Glucose, Bld: 89 mg/dL (ref 65–99)
Potassium: 4.4 mmol/L (ref 3.5–5.3)
Sodium: 137 mmol/L (ref 135–146)

## 2023-05-21 LAB — IRON,TIBC AND FERRITIN PANEL
%SAT: 26 % (ref 16–45)
Ferritin: 19 ng/mL (ref 16–288)
Iron: 82 ug/dL (ref 45–160)
TIBC: 311 ug/dL (ref 250–450)

## 2023-05-21 LAB — T3: T3, Total: 98 ng/dL (ref 76–181)

## 2023-05-21 LAB — TSH: TSH: 7.67 m[IU]/L — ABNORMAL HIGH (ref 0.40–4.50)

## 2023-06-15 ENCOUNTER — Encounter: Payer: Self-pay | Admitting: Family Medicine

## 2023-06-15 ENCOUNTER — Ambulatory Visit (INDEPENDENT_AMBULATORY_CARE_PROVIDER_SITE_OTHER): Payer: Medicare HMO | Admitting: Family Medicine

## 2023-06-15 VITALS — BP 144/75 | HR 62 | Temp 97.8°F | Wt 159.4 lb

## 2023-06-15 DIAGNOSIS — R590 Localized enlarged lymph nodes: Secondary | ICD-10-CM | POA: Diagnosis not present

## 2023-06-15 DIAGNOSIS — D849 Immunodeficiency, unspecified: Secondary | ICD-10-CM | POA: Diagnosis not present

## 2023-06-15 DIAGNOSIS — J029 Acute pharyngitis, unspecified: Secondary | ICD-10-CM

## 2023-06-15 DIAGNOSIS — B349 Viral infection, unspecified: Secondary | ICD-10-CM

## 2023-06-15 LAB — CBC WITH DIFFERENTIAL/PLATELET
Basophils Absolute: 0.1 10*3/uL (ref 0.0–0.1)
Basophils Relative: 1.2 % (ref 0.0–3.0)
Eosinophils Absolute: 0.3 10*3/uL (ref 0.0–0.7)
Eosinophils Relative: 6.1 % — ABNORMAL HIGH (ref 0.0–5.0)
HCT: 41.8 % (ref 36.0–46.0)
Hemoglobin: 13 g/dL (ref 12.0–15.0)
Lymphocytes Relative: 24.2 % (ref 12.0–46.0)
Lymphs Abs: 1.3 10*3/uL (ref 0.7–4.0)
MCHC: 31.1 g/dL (ref 30.0–36.0)
MCV: 89 fL (ref 78.0–100.0)
Monocytes Absolute: 0.7 10*3/uL (ref 0.1–1.0)
Monocytes Relative: 13.6 % — ABNORMAL HIGH (ref 3.0–12.0)
Neutro Abs: 2.9 10*3/uL (ref 1.4–7.7)
Neutrophils Relative %: 54.9 % (ref 43.0–77.0)
Platelets: 150 10*3/uL (ref 150.0–400.0)
RBC: 4.7 Mil/uL (ref 3.87–5.11)
RDW: 14.4 % (ref 11.5–15.5)
WBC: 5.3 10*3/uL (ref 4.0–10.5)

## 2023-06-15 LAB — POC COVID19 BINAXNOW: SARS Coronavirus 2 Ag: NEGATIVE

## 2023-06-15 LAB — POCT RAPID STREP A (OFFICE): Rapid Strep A Screen: NEGATIVE

## 2023-06-15 MED ORDER — AMOXICILLIN 875 MG PO TABS: 875.0000 mg | ORAL_TABLET | Freq: Two times a day (BID) | ORAL | 0 refills | Status: AC

## 2023-06-15 NOTE — Progress Notes (Signed)
OFFICE VISIT  06/15/2023  CC:  Chief Complaint  Patient presents with   Sore Throat    Few days; sore throat, trouble swallowing, no sense of taste. Denies congestion, nausea. Pt has not taken anything to relieve symptoms other than tylenol.     Patient is a 82 y.o. female who presents for sore throat.  HPI: Bethany Grant has felt fatigued for the last 10 to 14 days.  She began to ache in her hands.  She felt like she might be having a flare of rheumatoid arthritis but rheumatologist did not think so.  Over the last several days she has developed a sore throat/sore anterior neck on the right side, hurts to swallow.  She has impaired sense of taste lately as well. No fevers but she has felt a few chills.  No nasal congestion/sinus pressure, no cough. She had a bit of a rash on the upper chest recently and was reassured by the dermatologist.  No abdominal pain, no nausea or vomiting, no diarrhea. Her last Enbrel treatment was 6 days ago.  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   Pulmonary nodule    followed by Dr. Craige Cotta   Rheumatoid arthritis(714.0)    hands and feet   Right knee pain    ortho->inj summer 2023 helpful   Thrombocytopenia (HCC)    mild, chronic    Past Surgical History:  Procedure Laterality Date   ABDOMINAL HYSTERECTOMY  1991   TAH/BSO   BARTHOLIN GLAND CYST EXCISION  1980   RIGHT   BREAST SURGERY  1988   BREAST CYST   COLONOSCOPY N/A 08/24/2013   Procedure: COLONOSCOPY;  Surgeon: Malissa Hippo, MD;  Location: [facility];  Service: Endoscopy;  Laterality: N/A;  100   COLONOSCOPY WITH  PROPOFOL N/A 09/09/2020   diverticulosis and hemorrhoids, o/w normal.  Procedure: COLONOSCOPY WITH PROPOFOL;  Surgeon: Toledo, Boykin Nearing, MD;  Location: [facility];  Service: Gastroenterology;  Laterality: N/A;   CT CORONARY CA SCORING  07/2020   29th%tile   ESOPHAGOGASTRODUODENOSCOPY (EGD) WITH PROPOFOL N/A 09/09/2020   gastritis and erythematous duodenopathy. Procedure: ESOPHAGOGASTRODUODENOSCOPY (EGD) WITH PROPOFOL;  Surgeon: Toledo, Boykin Nearing, MD;  Location: [facility];  Service: Gastroenterology;  Laterality: N/A;   FOOT SURGERY     2 TIMES   FOOT SURGERY Left    HAND SURGERY     HEMORRHOID SURGERY     been over 20 years ago   OLECRANON BURSECTOMY Right 11/29/2013   Procedure: EXCISION OLECRANON BURSA RIGHT ;  Surgeon: Nicki Reaper, MD;  Location: [facility];  Service: Orthopedics;  Laterality: Right;   OOPHORECTOMY     BSO   TENOSYNOVECTOMY Right 11/29/2013   Procedure: TENOSYNOVECTOMY FLEXOR TENDON RIGHT WRIST ;  Surgeon: Nicki Reaper, MD;  Location: [facility];  Service: Orthopedics;  Laterality: Right;   WEIL OSTEOTOMY Right 08/10/2018   Procedure: WEIL OSTEOTOMY 2, 3,4 METATARSAL RIGHT FOOT, PROXIMAL INTERPHALANGEAL RESECTION TOES 2, 3, 4;  Surgeon: Nadara Mustard, MD;  Location: [facility];  Service: Orthopedics;  Laterality: Right;    Outpatient Medications Prior to Visit  Medication Sig Dispense Refill   Acetaminophen (TYLENOL PO) Take by mouth as needed.     ALPRAZolam (XANAX) 0.5 MG tablet 1 tab po qhs 90 tablet 1   Ascorbic Acid (VITAMIN C PO) Take 500 mg by mouth daily.     aspirin EC 81 MG tablet Take 1 tablet (81 mg total) by mouth daily. Swallow whole. 90 tablet 3   Cholecalciferol (VITAMIN D3) 25 MCG (1000 UT) CAPS Take 1,000 Units by mouth daily.     ciclopirox (LOPROX) 0.77 % cream as needed.     Cyanocobalamin (VITAMIN B-12 PO) Take 1,000 mg by mouth daily.     cycloSPORINE (RESTASIS) 0.05 % ophthalmic emulsion Place 1 drop into  both eyes 2 (two) times daily.     etanercept (ENBREL SURECLICK) 50 MG/ML injection      fluticasone (FLONASE) 50 MCG/ACT nasal spray Place 1 spray into both nostrils daily. 16 g 2   losartan (COZAAR) 100 MG tablet Take 1 tablet (100 mg total) by mouth daily. 90 tablet 1   pantoprazole (PROTONIX) 40 MG tablet Take 1 tablet (40 mg total) by mouth daily. 90 tablet 1   rosuvastatin (CRESTOR) 10 MG tablet TAKE 1 TABLET BY MOUTH EVERY DAY 90 tablet 3   No facility-administered medications prior to visit.    Allergies  Allergen Reactions   Other Nausea And Vomiting and Other (See Comments)    Doesn't do well with pain  Medications, sensitivity to tape   Hydrocodone-Acetaminophen Nausea And Vomiting and Other (See Comments)   Trazodone And Nefazodone Other (See Comments)    Fatigue/hangover effect    Review of Systems  As per HPI  PE:    06/15/2023    8:30 AM 05/19/2023    9:17 AM 05/19/2023    9:15 AM  Vitals with BMI  Weight 159 lbs 6 oz  159 lbs 13 oz  Systolic 144 124 782  Diastolic 75 68 78  Pulse 62  50     Physical Exam  Gen: Alert, well appearing.  Patient is oriented to person, place, time, and situation. AFFECT: pleasant, lucid thought and speech. ENT: Ears: EACs clear, normal epithelium.  TMs with good light reflex and landmarks bilaterally.  Eyes: no injection, icteris, swelling, or exudate.  EOMI, PERRLA. Nose: no drainage or turbinate edema/swelling.  No injection or focal lesion.  Mouth: lips without lesion/swelling.  Oral mucosa pink and moist.  Dentition intact and without obvious caries or gingival swelling.  Oropharynx without erythema, exudate, or swelling.  Neck: small, tender LN in jugulodigastric area on R side.  LABS:  Last CBC Lab Results  Component Value Date   WBC 5.8 05/19/2023   HGB 12.7 05/19/2023   HCT 40.4 05/19/2023   MCV 88.4 05/19/2023   MCH 27.8 05/19/2023   RDW 12.6 05/19/2023   PLT 154 05/19/2023   Lab Results  Component Value  Date   IRON 82 05/19/2023   TIBC 311 05/19/2023   FERRITIN 19 05/19/2023   Last metabolic panel Lab Results  Component Value Date   GLUCOSE 89 05/19/2023   NA 137 05/19/2023   K 4.4 05/19/2023   CL 100 05/19/2023   CO2 30 05/19/2023   BUN 14 05/19/2023   CREATININE 1.17 (H) 05/19/2023   GFR 45.86 (L) 09/17/2022   CALCIUM 9.3 05/19/2023   PROT 7.4 02/16/2023   ALBUMIN 4.5 02/23/2022  LABGLOB 3.0 08/19/2021   AGRATIO 1.3 08/19/2021   BILITOT 0.5 02/16/2023   ALKPHOS 95 02/23/2022   AST 20 02/16/2023   ALT 13 02/16/2023   ANIONGAP 10 08/19/2021   Last thyroid functions Lab Results  Component Value Date   TSH 7.67 (H) 05/19/2023   T3TOTAL 98 05/19/2023   T4TOTAL 7.2 05/19/2023   IMPRESSION AND PLAN:  #1 sore throat, right jugulodigastric reactive node, general malaise. Immunocompromised patient due to Enbrel treatment for her RA. Rapid covid and rapid strep here today NEG. Out of the utmost caution we will start amoxicillin 875 twice daily x 7 days. Check CBC today.  An After Visit Summary was printed and given to the patient.  FOLLOW UP: Return in about 1 week (around 06/22/2023) for f/u ST/cerv LAD.  Signed:  Santiago Bumpers, MD           06/15/2023

## 2023-06-22 ENCOUNTER — Ambulatory Visit: Payer: Medicare HMO | Admitting: Family Medicine

## 2023-07-08 ENCOUNTER — Emergency Department (HOSPITAL_COMMUNITY): Payer: Medicare HMO

## 2023-07-08 ENCOUNTER — Telehealth: Payer: Self-pay | Admitting: Urgent Care

## 2023-07-08 ENCOUNTER — Ambulatory Visit: Payer: Medicare HMO | Admitting: Urgent Care

## 2023-07-08 ENCOUNTER — Encounter (HOSPITAL_COMMUNITY): Payer: Self-pay

## 2023-07-08 ENCOUNTER — Encounter: Payer: Self-pay | Admitting: Urgent Care

## 2023-07-08 ENCOUNTER — Emergency Department (HOSPITAL_COMMUNITY)
Admission: EM | Admit: 2023-07-08 | Discharge: 2023-07-08 | Disposition: A | Discharge status: Home / Self Care | Payer: Medicare HMO | Attending: Emergency Medicine | Admitting: Emergency Medicine

## 2023-07-08 VITALS — BP 101/65 | HR 74 | Temp 98.2°F | Wt 158.0 lb

## 2023-07-08 DIAGNOSIS — R8290 Unspecified abnormal findings in urine: Secondary | ICD-10-CM | POA: Diagnosis not present

## 2023-07-08 DIAGNOSIS — R531 Weakness: Secondary | ICD-10-CM | POA: Insufficient documentation

## 2023-07-08 DIAGNOSIS — Z7982 Long term (current) use of aspirin: Secondary | ICD-10-CM | POA: Diagnosis not present

## 2023-07-08 DIAGNOSIS — N390 Urinary tract infection, site not specified: Secondary | ICD-10-CM

## 2023-07-08 DIAGNOSIS — R946 Abnormal results of thyroid function studies: Secondary | ICD-10-CM

## 2023-07-08 DIAGNOSIS — R300 Dysuria: Secondary | ICD-10-CM | POA: Diagnosis present

## 2023-07-08 LAB — BASIC METABOLIC PANEL
Anion gap: 11 (ref 5–15)
BUN: 27 mg/dL — ABNORMAL HIGH (ref 8–23)
CO2: 23 mmol/L (ref 22–32)
Calcium: 8.4 mg/dL — ABNORMAL LOW (ref 8.9–10.3)
Chloride: 95 mmol/L — ABNORMAL LOW (ref 98–111)
Creatinine, Ser: 1.63 mg/dL — ABNORMAL HIGH (ref 0.44–1.00)
GFR, Estimated: 32 mL/min — ABNORMAL LOW (ref >60)
Glucose, Bld: 149 mg/dL — ABNORMAL HIGH (ref 70–99)
Potassium: 4 mmol/L (ref 3.5–5.1)
Sodium: 129 mmol/L — ABNORMAL LOW (ref 135–145)

## 2023-07-08 LAB — COMPREHENSIVE METABOLIC PANEL
ALT: 14 U/L (ref 0–35)
AST: 24 U/L (ref 0–37)
Albumin: 3.6 g/dL (ref 3.5–5.2)
Alkaline Phosphatase: 64 U/L (ref 39–117)
BUN: 24 mg/dL — ABNORMAL HIGH (ref 6–23)
CO2: 27 meq/L (ref 19–32)
Calcium: 8.2 mg/dL — ABNORMAL LOW (ref 8.4–10.5)
Chloride: 97 meq/L (ref 96–112)
Creatinine, Ser: 1.65 mg/dL — ABNORMAL HIGH (ref 0.40–1.20)
GFR: 29.26 mL/min — ABNORMAL LOW (ref >60.00)
Glucose, Bld: 96 mg/dL (ref 70–99)
Potassium: 4.2 meq/L (ref 3.5–5.1)
Sodium: 133 meq/L — ABNORMAL LOW (ref 135–145)
Total Bilirubin: 0.5 mg/dL (ref 0.2–1.2)
Total Protein: 6.5 g/dL (ref 6.0–8.3)

## 2023-07-08 LAB — URINALYSIS, MICROSCOPIC ONLY

## 2023-07-08 LAB — URINALYSIS, ROUTINE W REFLEX MICROSCOPIC
Bilirubin Urine: NEGATIVE
Glucose, UA: NEGATIVE mg/dL
Ketones, ur: NEGATIVE mg/dL
Nitrite: NEGATIVE
Protein, ur: 30 mg/dL — AB
Specific Gravity, Urine: 1.015 (ref 1.005–1.030)
WBC, UA: >50 WBC/hpf (ref 0–5)
pH: 5 (ref 5.0–8.0)

## 2023-07-08 LAB — CBC WITH DIFFERENTIAL/PLATELET
Basophils Absolute: 0 10*3/uL (ref 0.0–0.1)
Basophils Relative: 0.3 % (ref 0.0–3.0)
Eosinophils Absolute: 0.1 10*3/uL (ref 0.0–0.7)
Eosinophils Relative: 0.9 % (ref 0.0–5.0)
HCT: 36.9 % (ref 36.0–46.0)
Hemoglobin: 11.9 g/dL — ABNORMAL LOW (ref 12.0–15.0)
Lymphocytes Relative: 10.2 % — ABNORMAL LOW (ref 12.0–46.0)
Lymphs Abs: 0.7 10*3/uL (ref 0.7–4.0)
MCHC: 32.4 g/dL (ref 30.0–36.0)
MCV: 86.3 fL (ref 78.0–100.0)
Monocytes Absolute: 1.6 10*3/uL — ABNORMAL HIGH (ref 0.1–1.0)
Monocytes Relative: 24.9 % — ABNORMAL HIGH (ref 3.0–12.0)
Neutro Abs: 4.2 10*3/uL (ref 1.4–7.7)
Neutrophils Relative %: 63.7 % (ref 43.0–77.0)
Platelets: 153 10*3/uL (ref 150.0–400.0)
RBC: 4.28 Mil/uL (ref 3.87–5.11)
RDW: 13.1 % (ref 11.5–15.5)
WBC: 6.6 10*3/uL (ref 4.0–10.5)

## 2023-07-08 LAB — POCT URINALYSIS DIPSTICK
Bilirubin, UA: NEGATIVE
Glucose, UA: NEGATIVE
Ketones, UA: POSITIVE
Nitrite, UA: NEGATIVE
Protein, UA: POSITIVE — AB
Spec Grav, UA: 1.015 (ref 1.010–1.025)
Urobilinogen, UA: 0.2 U/dL
pH, UA: 5.5 (ref 5.0–8.0)

## 2023-07-08 LAB — CBC
HCT: 36 % (ref 36.0–46.0)
Hemoglobin: 11.6 g/dL — ABNORMAL LOW (ref 12.0–15.0)
MCH: 28 pg (ref 26.0–34.0)
MCHC: 32.2 g/dL (ref 30.0–36.0)
MCV: 87 fL (ref 80.0–100.0)
Platelets: 163 10*3/uL (ref 150–400)
RBC: 4.14 MIL/uL (ref 3.87–5.11)
RDW: 12.7 % (ref 11.5–15.5)
WBC: 7 10*3/uL (ref 4.0–10.5)
nRBC: 0 % (ref 0.0–0.2)

## 2023-07-08 LAB — TSH: TSH: 5.74 u[IU]/mL — ABNORMAL HIGH (ref 0.35–5.50)

## 2023-07-08 LAB — CBG MONITORING, ED: Glucose-Capillary: 93 mg/dL (ref 70–99)

## 2023-07-08 MED ORDER — CEFTRIAXONE SODIUM 1 G IJ SOLR
1.0000 g | Freq: Once | INTRAMUSCULAR | Status: AC
Start: 2023-07-08 — End: 2023-07-08
  Administered 2023-07-08: 1 g via INTRAMUSCULAR
  Filled 2023-07-08: qty 10

## 2023-07-08 MED ORDER — CEPHALEXIN 500 MG PO CAPS: 500.0000 mg | ORAL_CAPSULE | Freq: Four times a day (QID) | ORAL | 0 refills | Status: DC

## 2023-07-08 MED ORDER — STERILE WATER FOR INJECTION IJ SOLN
INTRAMUSCULAR | Status: AC
  Filled 2023-07-08: qty 10

## 2023-07-08 NOTE — Progress Notes (Signed)
Established Patient Office Visit  Subjective:  Patient ID: Bethany Grant, female    DOB: Jun 01, 1941  Age: 82 y.o. MRN: [id number]  Chief Complaint  Patient presents with   Fatigue    Pt has been very tired and has had no appetite the past 4 days. She has complained of neck and shoulder pain. She denies cold symptoms.    [name] with a history of hyperlipidemia, hypertension, and gastroesophageal reflux disease, presents with a four to five-day history of generalized weakness and anorexia. She is accompanied by her husband. She reports being bedridden due to profound weakness, with difficulty walking across the room. She denies any associated pain, muscle or joint aches. She has a decreased appetite, but denies nausea or vomiting. She has been forcing herself to eat, but intake is minimal. She also reports episodes of night sweats, with drenching sweats to the point of wetting her head. She has experienced chills during the day and night. She denies any recent changes in medication, exposure to sick contacts, or any other physical symptoms such as shortness of breath, headache, sore throat, ear pressure, runny nose, or sinus drainage. She also denies any recent exertion or travel. She denies any abnormal heartbeats or changes to her fingernails.  The patient had a rash on her chest, underarms, and back, which was evaluated by a dermatologist three weeks prior to the onset of the current symptoms. The rash was deemed to be a normal rash and the patient has been using CeraVe itch relief for symptom management. The rash resolved 3 weeks ago and has not returned. Marland Kitchen  Her fluid intake has been minimal, with approximately one and a half 16-ounce bottles of water and half a 12-ounce can of Coke in the past 24 hours. She denies any urinary symptoms or changes in urine color. She also denies any changes in skin color. She reports feeling wobbly. She has been sleeping excessively for the past  four days.  The patient has been on a stable medication regimen including rosuvastatin for hyperlipidemia, pantoprazole for reflux, losartan for hypertension, Flonase nasal spray, Enbrel, Restasis eye drops, vitamin B12, ciclopirox cream as needed, vitamin D3, baby aspirin, vitamin C, and Xanax as needed. She denies taking any additional medications and no changes to her chronic medications.  She was noted to have an elevated TSH level in October, this has not yet been rechecked.      Patient Active Problem List   Diagnosis Date Noted   COVID-19 03/10/2023   Lymphopenia 05/06/2021   Leukopenia 05/06/2021   Healthcare maintenance 02/05/2021   CAD (coronary artery disease) 08/08/2020   Thoracic aortic atherosclerosis (HCC) 08/08/2020   Primary insomnia 08/08/2020   Chronic pruritus 07/08/2020   Claw toe, acquired, right    Cough 04/21/2018   Nasal vestibulitis 04/21/2018   TMJ pain dysfunction syndrome 04/07/2018   Anosmia 03/04/2017   Abnormal EKG 12/20/2014   Other fatigue 12/20/2014   RBBB 12/20/2013   LAFB (left anterior fascicular block) 12/20/2013   Bifascicular block 12/20/2013   RA (rheumatoid arthritis) (HCC) 12/20/2013   Rheumatoid lung disease (HCC) 10/12/2013   Osteopenia 06/14/2013   Hypertension    RHINITIS 02/25/2009   ARTHRITIS, RHEUMATOID 05/03/2008   Past Medical History:  Diagnosis Date   Anxiety    Bradycardia    Sinus bradycardia with right bundle but block and LAFB-->avoid BBs and CCBs   Chronic renal insufficiency, stage 3 (moderate) (HCC)    GFR 40s  Emphysema lung (HCC)    noted on CT scanning   GERD (gastroesophageal reflux disease)    Hypercholesterolemia    Hypertension    Insomnia    Osteopenia 09/2017   T score -2.1 FRAX 23% and 8.3% (has RA).   Pulmonary nodule    followed by Dr. Craige Cotta   Rheumatoid arthritis(714.0)    hands and feet   Right knee pain    ortho->inj summer 2023 helpful   Thrombocytopenia (HCC)    mild, chronic    Past Surgical History:  Procedure Laterality Date   ABDOMINAL HYSTERECTOMY  1991   TAH/BSO   BARTHOLIN GLAND CYST EXCISION  1980   RIGHT   BREAST SURGERY  1988   BREAST CYST   COLONOSCOPY N/A 08/24/2013   Procedure: COLONOSCOPY;  Surgeon: Malissa Hippo, MD;  Location: [facility];  Service: Endoscopy;  Laterality: N/A;  100   COLONOSCOPY WITH PROPOFOL N/A 09/09/2020   diverticulosis and hemorrhoids, o/w normal.  Procedure: COLONOSCOPY WITH PROPOFOL;  Surgeon: Toledo, Boykin Nearing, MD;  Location: [facility];  Service: Gastroenterology;  Laterality: N/A;   CT CORONARY CA SCORING  07/2020   29th%tile   ESOPHAGOGASTRODUODENOSCOPY (EGD) WITH PROPOFOL N/A 09/09/2020   gastritis and erythematous duodenopathy. Procedure: ESOPHAGOGASTRODUODENOSCOPY (EGD) WITH PROPOFOL;  Surgeon: Toledo, Boykin Nearing, MD;  Location: [facility];  Service: Gastroenterology;  Laterality: N/A;   FOOT SURGERY     2 TIMES   FOOT SURGERY Left    HAND SURGERY     HEMORRHOID SURGERY     been over 20 years ago   OLECRANON BURSECTOMY Right 11/29/2013   Procedure: EXCISION OLECRANON BURSA RIGHT ;  Surgeon: Nicki Reaper, MD;  Location: [facility];  Service: Orthopedics;  Laterality: Right;   OOPHORECTOMY     BSO   TENOSYNOVECTOMY Right 11/29/2013   Procedure: TENOSYNOVECTOMY FLEXOR TENDON RIGHT WRIST ;  Surgeon: Nicki Reaper, MD;  Location: [facility];  Service: Orthopedics;  Laterality: Right;   WEIL OSTEOTOMY Right 08/10/2018   Procedure: WEIL OSTEOTOMY 2, 3,4 METATARSAL RIGHT FOOT, PROXIMAL INTERPHALANGEAL RESECTION TOES 2, 3, 4;  Surgeon: Nadara Mustard, MD;  Location: [facility];  Service: Orthopedics;  Laterality: Right;   Social History   Tobacco Use   Smoking status: Former    Current packs/day: 0.00    Average packs/day: 0.8 packs/day for 30.0 years (24.0 ttl pk-yrs)    Types: Cigarettes    Start date: 07/27/1974    Quit date: 07/27/2004    Years since quitting: 18.9    Smokeless tobacco: Never  Vaping Use   Vaping status: Never Used  Substance Use Topics   Alcohol use: No    Alcohol/week: 0.0 standard drinks of alcohol   Drug use: No      ROS: as noted in HPI  Objective:     BP 101/65 (BP Location: Right Arm, Cuff Size: Normal)   Pulse 74   Temp 98.2 F (36.8 C) (Oral)   Wt 158 lb (71.7 kg)   LMP  (LMP Unknown)   SpO2 97%   BMI 24.75 kg/m  BP Readings from Last 3 Encounters:  07/08/23 101/65  06/15/23 (!) 144/75  05/19/23 124/68   Wt Readings from Last 3 Encounters:  07/08/23 158 lb (71.7 kg)  06/15/23 159 lb 6.4 oz (72.3 kg)  05/19/23 159 lb 12.8 oz (72.5 kg)      Physical Exam Vitals and nursing note reviewed. Exam conducted with a chaperone present (husband).  Constitutional:      General: She is not in acute distress.    Appearance: Normal appearance. She is not ill-appearing, toxic-appearing or diaphoretic.  HENT:     Head: Normocephalic and atraumatic.     Right Ear: Tympanic membrane, ear canal and external ear normal. There is no impacted cerumen.     Left Ear: Tympanic membrane, ear canal and external ear normal. There is no impacted cerumen.     Nose: Nose normal.     Mouth/Throat:     Mouth: Mucous membranes are moist.     Pharynx: Oropharynx is clear. No oropharyngeal exudate or posterior oropharyngeal erythema.  Eyes:     General: No scleral icterus.       Right eye: No discharge.        Left eye: No discharge.     Extraocular Movements: Extraocular movements intact.     Pupils: Pupils are equal, round, and reactive to light.  Neck:     Thyroid: No thyroid mass, thyromegaly or thyroid tenderness.   Cardiovascular:     Rate and Rhythm: Normal rate and regular rhythm.     Pulses: Normal pulses.     Heart sounds: No murmur heard. Pulmonary:     Effort: Pulmonary effort is normal. No respiratory distress.     Breath sounds: Normal breath sounds. No stridor. No wheezing or rhonchi.  Abdominal:     General:  Abdomen is flat. Bowel sounds are normal. There is no distension.     Palpations: Abdomen is soft. There is no mass.     Tenderness: There is no abdominal tenderness. There is no guarding.  Musculoskeletal:        General: Deformity (chronic RA deformity noted to B hands) present.     Cervical back: Normal range of motion and neck supple. No rigidity or tenderness.     Right lower leg: No edema.     Left lower leg: No edema.  Lymphadenopathy:     Cervical: No cervical adenopathy.  Skin:    General: Skin is warm and dry.     Coloration: Skin is not jaundiced.     Findings: No bruising, erythema or rash.  Neurological:     General: No focal deficit present.     Mental Status: She is alert and oriented to person, place, and time.     Sensory: No sensory deficit.     Motor: No weakness.  Psychiatric:        Mood and Affect: Mood normal.        Behavior: Behavior normal.      Results for orders placed or performed in visit on 07/08/23  POCT urinalysis dipstick  Result Value Ref Range   Color, UA yellow    Clarity, UA cloudy    Glucose, UA Negative Negative   Bilirubin, UA negative    Ketones, UA positive    Spec Grav, UA 1.015 1.010 - 1.025   Blood, UA 1+    pH, UA 5.5 5.0 - 8.0   Protein, UA Positive (A) Negative   Urobilinogen, UA 0.2 0.2 or 1.0 E.U./dL   Nitrite, UA negative    Leukocytes, UA Moderate (2+) (A) Negative   Appearance     Odor      Last CBC Lab Results  Component Value Date   WBC 5.3 06/15/2023   HGB 13.0 06/15/2023   HCT 41.8 06/15/2023   MCV 89.0 06/15/2023   MCH 27.8 05/19/2023   RDW 14.4 06/15/2023  PLT 150.0 06/15/2023   Last metabolic panel Lab Results  Component Value Date   GLUCOSE 89 05/19/2023   NA 137 05/19/2023   K 4.4 05/19/2023   CL 100 05/19/2023   CO2 30 05/19/2023   BUN 14 05/19/2023   CREATININE 1.17 (H) 05/19/2023   GFR 45.86 (L) 09/17/2022   CALCIUM 9.3 05/19/2023   PROT 7.4 02/16/2023   ALBUMIN 4.5 02/23/2022    LABGLOB 3.0 08/19/2021   AGRATIO 1.3 08/19/2021   BILITOT 0.5 02/16/2023   ALKPHOS 95 02/23/2022   AST 20 02/16/2023   ALT 13 02/16/2023   ANIONGAP 10 08/19/2021   Last thyroid functions Lab Results  Component Value Date   TSH 7.67 (H) 05/19/2023   T3TOTAL 98 05/19/2023   T4TOTAL 7.2 05/19/2023      The ASCVD Risk score (Arnett DK, et al., 2019) failed to calculate for the following reasons:   The 2019 ASCVD risk score is only valid for ages 47 to 55  Assessment & Plan:  Generalized weakness -     POCT urinalysis dipstick -     CBC with Differential/Platelet -     TSH -     Urine Culture -     Urine Microscopic -     Comprehensive metabolic panel  Abnormal thyroid function test -     CBC with Differential/Platelet -     TSH -     Comprehensive metabolic panel  Abnormal urinalysis -     Urine Culture -     Urine Microscopic  Generalized Weakness Acute onset over the past 4-5 days, associated with anorexia, night sweats, and chills. No associated pain, respiratory symptoms, or urinary symptoms. Recent history of a rash, treated by a dermatologist with topical itch relief. No recent changes in medications or exposure to sick contacts. -Order stat blood work including CBC, CMP, and thyroid function tests to evaluate for potential causes including infection, electrolyte abnormalities, and thyroid dysfunction. Lactic acid desired, however do not have appropriate tubes for collection. -Urine sample collected with moderate leuks and blood; STAT urine culture and microscopic to determine if this is contamination vs bacteruria. -Encourage increased fluid intake to prevent acute renal failure due to decreased oral intake.  Ddx for generalized weakness: ARF, dehydration, metabolic abnormality, sepsis, anemia, hypothyroidism. Although ACS can present with generalized weakness, BP stable, last lipids WNL, low index of suspicion for this. May need to rule out occult infection with CXR.  Pending results, possible ER presentation for full workup.   Abnormal thyroid function test: Elevated TSH levels (7.4) noted in October, normal free hormones.  -Recheck thyroid function tests  Keep follow up appointment on 08/19/23 with Dr. Milinda Cave as previously scheduled, sooner as needed.  Maretta Bees, PA

## 2023-07-08 NOTE — Telephone Encounter (Signed)
Called pt regarding lab results, two patient identifiers obtained. Given pt's lab results, I instructed her to go to the ER for a further workup. Concern for possible urosepsis along with ARF. Pt aware that ER workup needed and will head to either [facility] or [facility] ER. Husband will drive. All questions/ concerns addressed.

## 2023-07-08 NOTE — ED Triage Notes (Signed)
Pt arrives via POV. Pt states she was sent to ED by her PCP for possible renal failure and a UTI. Pt reports generalized weakness, fatigue, and dysuria for about 1 week.

## 2023-07-08 NOTE — Discharge Instructions (Addendum)
Stop taking your Cozaar.  This is could be contributing to your decreased kidney function.  Call your doctor tomorrow to schedule a follow-up visit as well as to get a prescription for a different high blood pressure medication.  Your blood pressure today was stable.  Return here for fever, vomiting, severe flank pain

## 2023-07-08 NOTE — ED Provider Notes (Signed)
[place], [place] EMERGENCY DEPARTMENT AT [facility] Provider Note   CSN: [id number] Arrival date & time: 07/08/23  1425     History  Chief Complaint  Patient presents with   Weakness   Dysuria    Bethany Grant is a 82 y.o. female.  82 year old female presents with dysuria for about 5 days.  Denies any fever, flank pain, severe abdominal discomfort.  History of UTI in the past.  No current vaginal bleeding or discharge.  States that she has had some weakness associated with this.  She is however is able to do her normal activities.  No treatment use for this prior to arrival       Home Medications Prior to Admission medications   Medication Sig Start Date End Date Taking? Authorizing Provider  Acetaminophen (TYLENOL PO) Take by mouth as needed.    [provider]  ALPRAZolam Prudy Feeler) 0.5 MG tablet 1 tab po qhs 02/16/23   McGowen, Maryjean Morn, MD  Ascorbic Acid (VITAMIN C PO) Take 500 mg by mouth daily.    [provider]  aspirin EC 81 MG tablet Take 1 tablet (81 mg total) by mouth daily. Swallow whole. 08/08/20   Jake Bathe, MD  Cholecalciferol (VITAMIN D3) 25 MCG (1000 UT) CAPS Take 1,000 Units by mouth daily.    [provider]  ciclopirox (LOPROX) 0.77 % cream as needed. 03/31/19   [provider]  Cyanocobalamin (VITAMIN B-12 PO) Take 1,000 mg by mouth daily.    [provider]  cycloSPORINE (RESTASIS) 0.05 % ophthalmic emulsion Place 1 drop into both eyes 2 (two) times daily.    [provider]  etanercept (ENBREL SURECLICK) 50 MG/ML injection  09/17/21   [provider]  fluticasone (FLONASE) 50 MCG/ACT nasal spray Place 1 spray into both nostrils daily. 04/15/23   Coralyn Helling, MD  losartan (COZAAR) 100 MG tablet Take 1 tablet (100 mg total) by mouth daily. 02/16/23   McGowen, Maryjean Morn, MD  pantoprazole (PROTONIX) 40 MG tablet Take 1 tablet (40 mg total) by mouth daily. 02/16/23   McGowen, Maryjean Morn, MD   rosuvastatin (CRESTOR) 10 MG tablet TAKE 1 TABLET BY MOUTH EVERY DAY 04/15/23   Jake Bathe, MD      Allergies    Other, Hydrocodone-acetaminophen, and Trazodone and nefazodone    Review of Systems   Review of Systems  All other systems reviewed and are negative.   Physical Exam Updated Vital Signs BP 126/76 (BP Location: Left Arm)   Pulse 81   Temp 98.1 F (36.7 C) (Oral)   Resp 18   Ht 1.702 m (5\' 7" )   Wt 71.7 kg   LMP  (LMP Unknown)   SpO2 98%   BMI 24.75 kg/m  Physical Exam Vitals and nursing note reviewed.  Constitutional:      General: She is not in acute distress.    Appearance: Normal appearance. She is well-developed. She is not toxic-appearing.  HENT:     Head: Normocephalic and atraumatic.  Eyes:     General: Lids are normal.     Conjunctiva/sclera: Conjunctivae normal.     Pupils: Pupils are equal, round, and reactive to light.  Neck:     Thyroid: No thyroid mass.     Trachea: No tracheal deviation.  Cardiovascular:     Rate and Rhythm: Normal rate and regular rhythm.     Heart sounds: Normal heart sounds. No murmur heard.    No gallop.  Pulmonary:     Effort: Pulmonary effort is normal. No respiratory distress.     Breath sounds: Normal breath sounds. No stridor. No decreased breath sounds, wheezing, rhonchi or rales.  Abdominal:     General: There is no distension.     Palpations: Abdomen is soft.     Tenderness: There is no abdominal tenderness. There is no rebound.  Musculoskeletal:        General: No tenderness. Normal range of motion.     Cervical back: Normal range of motion and neck supple.  Skin:    General: Skin is warm and dry.     Findings: No abrasion or rash.  Neurological:     Mental Status: She is alert and oriented to person, place, and time. Mental status is at baseline.     GCS: GCS eye subscore is 4. GCS verbal subscore is 5. GCS motor subscore is 6.     Cranial Nerves: No cranial nerve deficit.     Sensory: No sensory  deficit.     Motor: Motor function is intact.  Psychiatric:        Attention and Perception: Attention normal.        Speech: Speech normal.        Behavior: Behavior normal.     ED Results / Procedures / Treatments   Labs (all labs ordered are listed, but only abnormal results are displayed) Labs Reviewed  CBC - Abnormal; Notable for the following components:      Result Value   Hemoglobin 11.6 (*)    All other components within normal limits  URINALYSIS, ROUTINE W REFLEX MICROSCOPIC - Abnormal; Notable for the following components:   APPearance CLOUDY (*)    Hgb urine dipstick MODERATE (*)    Protein, ur 30 (*)    Leukocytes,Ua LARGE (*)    Bacteria, UA MANY (*)    All other components within normal limits  URINE CULTURE  BASIC METABOLIC PANEL  CBG MONITORING, ED    EKG EKG Interpretation Date/Time:  Thursday July 08 2023 15:35:14 EST Ventricular Rate:  71 PR Interval:  146 QRS Duration:  152 QT Interval:  406 QTC Calculation: 441 R Axis:   145  Text Interpretation: Normal sinus rhythm Right bundle branch block Left posterior fascicular block Bifascicular block Abnormal ECG When compared with ECG of 08-Mar-2023 09:00, PREVIOUS ECG IS PRESENT No significant change since last tracing Confirmed by Lorre Nick ([id number]) on 07/08/2023 9:17:55 PM  Radiology DG Chest Portable 1 View Result Date: 07/08/2023 CLINICAL DATA:  Generalized weakness. EXAM: PORTABLE CHEST 1 VIEW COMPARISON:  September 08, 2022. FINDINGS: The heart size and mediastinal contours are within normal limits. Both lungs are clear. The visualized skeletal structures are unremarkable. IMPRESSION: No active disease. Electronically Signed   By: Lupita Raider M.D.   On: 07/08/2023 16:59    Procedures Procedures    Medications Ordered in ED Medications - No data to display  ED Course/ Medical Decision Making/ A&P                                 Medical Decision Making Amount and/or Complexity  of Data Reviewed Labs: ordered.  Risk Prescription drug management.   Patient is EKG shows normal sinus rhythm with a right bundle branch block.  Unchanged from prior.  Her urinalysis is consistent with infection.  She has no leukocytosis on her CBC.  Low suspicion for  sepsis.  Does have an increase in her creatinine.  Going back to her old creatinines she has had some chronic kidney disease before in the past.  Potassium is stable.  Patient given Rocephin 1 g IM.  Discussed with patient possibly of admission due to patient's worsening renal function versus outpatient management.  Patient is on losartan which could affect her kidney function.  Will have her hold this..  Will prescribe Keflex and patient to follow-up with her doctor tomorrow to arrange follow-up for her renal function as well as UTI.  Return precautions given  CRITICAL CARE Performed by: Toy Baker Total critical care time: 45 minutes Critical care time was exclusive of separately billable procedures and treating other patients. Critical care was necessary to treat or prevent imminent or life-threatening deterioration. Critical care was time spent personally by me on the following activities: development of treatment plan with patient and/or surrogate as well as nursing, discussions with consultants, evaluation of patient's response to treatment, examination of patient, obtaining history from patient or surrogate, ordering and performing treatments and interventions, ordering and review of laboratory studies, ordering and review of radiographic studies, pulse oximetry and re-evaluation of patient's condition.         Final Clinical Impression(s) / ED Diagnoses Final diagnoses:  None    Rx / DC Orders ED Discharge Orders     None         Lorre Nick, MD 07/08/23 2219

## 2023-07-08 NOTE — Patient Instructions (Addendum)
STAT labs and urine evaluation ordered to further assess the cause of your weakness. Pending results, we will notify you with further instructions.  Please drink small sips of water throughout the day, with a goal of drinking four 16-oz bottles daily.  If any new or worsening changes, please head to the emergency room.

## 2023-07-08 NOTE — ED Provider Triage Note (Signed)
Emergency Medicine Provider Triage Evaluation Note  Bethany Grant , a 82 y.o. female  was evaluated in triage.  Pt complains of weakness and dysuria.  Patient sent by primary care provider for concerns for possible AKI and UTI.  Patient reports she has had generalized weakness, fatigue and dysuria for the last week or so.  Does also endorse some subjective fever, chills and bodyaches at home.  Denies any vomiting or diarrhea.  No significant abdominal tenderness.  Review of Systems  Positive: As above Negative: As above  Physical Exam  BP (!) 137/57   Pulse 72   Temp 98.9 F (37.2 C)   Resp 18   Ht 5\' 7"  (1.702 m)   Wt 71.7 kg   LMP  (LMP Unknown)   SpO2 99%   BMI 24.75 kg/m  Gen:   Awake, no distress   Resp:  Normal effort  MSK:   Moves extremities without difficulty  Other:  No CVA tenderness, or focal abdominal tenderness  Medical Decision Making  Medically screening exam initiated at 4:22 PM.  Appropriate orders placed.  Shawna Orleans was informed that the remainder of the evaluation will be completed by another provider, this initial triage assessment does not replace that evaluation, and the importance of remaining in the ED until their evaluation is complete.     Smitty Knudsen, PA-C 07/08/23 1623

## 2023-07-09 ENCOUNTER — Telehealth: Payer: Self-pay | Admitting: Family Medicine

## 2023-07-09 MED ORDER — ROSUVASTATIN CALCIUM 10 MG PO TABS: 10.0000 mg | ORAL_TABLET | Freq: Every day | ORAL | 1 refills | Status: DC

## 2023-07-09 MED ORDER — PANTOPRAZOLE SODIUM 40 MG PO TBEC: 40.0000 mg | DELAYED_RELEASE_TABLET | Freq: Every day | ORAL | 1 refills | Status: DC

## 2023-07-09 MED ORDER — ALPRAZOLAM 0.5 MG PO TABS: ORAL_TABLET | ORAL | 0 refills | Status: DC

## 2023-07-09 NOTE — Telephone Encounter (Signed)
I will refill her medication, all but the ACE. I DOUBT this is the cause of her current symptoms, but holding it is appropriate for now.  PLEASE have pt come back and see me on Monday 07/12/23. I want to recheck everything to ensure the appropriate treatment was provided in the ER. She had a pretty significant hyponatremia in the ER which was not addressed. This must be rechecked. Please notify pt that over the weekend if she continues to have extreme fatigue, or if she develops dizziness, confusion, or seizure-like activity, must return to ER. Again, please schedule a follow up visit with me on Monday 07/12/23 for recheck. Thank you!

## 2023-07-09 NOTE — Telephone Encounter (Signed)
Patient was seen in office  on 12/12 with Whitney. She was instructed to go to the ED and the physician at the ED instructed her to stop taking Losartan because this is what is causing her kidney function to be what it is according to the patient. She says the ED physician suggest that Dr. Milinda Cave switch her to something else. She is also requesting medication refills to be sent to [facility] in [place]. The medications needed for refill are   rosuvastatin (CRESTOR) 10 MG tablet  pantoprazole (PROTONIX) 40 MG tablet  ALPRAZolam (XANAX) 0.5 MG tablet  losartan (COZAAR) 100 MG tablet (Paused)  Or the replacement for this

## 2023-07-09 NOTE — Telephone Encounter (Signed)
Left message with pts husband for pt to call the office and schedule appt for monday

## 2023-07-09 NOTE — Telephone Encounter (Signed)
Sending this to both providers. Please advise

## 2023-07-09 NOTE — Telephone Encounter (Signed)
Noted. I spoke with Bethany Grant and she's following this up. Holding losartan and having pt return in 3d for recheck.

## 2023-07-10 LAB — URINE CULTURE
Culture: >=100000 — AB
MICRO NUMBER:: 15844319
SPECIMEN QUALITY:: ADEQUATE
Special Requests: NORMAL

## 2023-07-11 ENCOUNTER — Telehealth (HOSPITAL_BASED_OUTPATIENT_CLINIC_OR_DEPARTMENT_OTHER): Payer: Self-pay | Admitting: *Deleted

## 2023-07-11 NOTE — Telephone Encounter (Signed)
Post ED Visit - Positive Culture Follow-up  Culture report reviewed by antimicrobial stewardship pharmacist: [facility] []  Enzo Bi, Pharm.D. []  Celedonio Miyamoto, Pharm.D., BCPS AQ-ID []  Garvin Fila, Pharm.D., BCPS []  Georgina Pillion, Pharm.D., BCPS []  [place], [address].D., BCPS, AAHIVP []  Estella Husk, Pharm.D., BCPS, AAHIVP []  Lysle Pearl, PharmD, BCPS []  Phillips Climes, PharmD, BCPS []  Agapito Games, PharmD, BCPS []  Verlan Friends, PharmD []  Mervyn Gay, PharmD, BCPS []  Vinnie Level, PharmD  [facility] []  Len Childs, PharmD []  Greer Pickerel, PharmD []  Adalberto Cole, PharmD []  Perlie Gold, Rph []  Lonell Face) Jean Rosenthal, PharmD []  Earl Many, PharmD []  Junita Push, PharmD []  Dorna Leitz, PharmD []  Terrilee Files, PharmD []  Lynann Beaver, PharmD []  Keturah Barre, PharmD []  Loralee Pacas, PharmD [x]  Laureen Ochs, PharmD   Positive urine culture Treated with Cephalexin, organism sensitive to the same and no further patient follow-up is required at this time.  Patsey Berthold 07/11/2023, 8:51 AM

## 2023-07-12 ENCOUNTER — Ambulatory Visit (INDEPENDENT_AMBULATORY_CARE_PROVIDER_SITE_OTHER): Payer: Medicare HMO | Admitting: Urgent Care

## 2023-07-12 ENCOUNTER — Encounter: Payer: Self-pay | Admitting: Urgent Care

## 2023-07-12 VITALS — BP 126/72 | HR 62 | Temp 97.9°F | Wt 158.0 lb

## 2023-07-12 DIAGNOSIS — R531 Weakness: Secondary | ICD-10-CM | POA: Diagnosis not present

## 2023-07-12 DIAGNOSIS — E871 Hypo-osmolality and hyponatremia: Secondary | ICD-10-CM

## 2023-07-12 DIAGNOSIS — N390 Urinary tract infection, site not specified: Secondary | ICD-10-CM

## 2023-07-12 DIAGNOSIS — R944 Abnormal results of kidney function studies: Secondary | ICD-10-CM

## 2023-07-12 LAB — CBC WITH DIFFERENTIAL/PLATELET
Basophils Absolute: 0 10*3/uL (ref 0.0–0.1)
Basophils Relative: 0.6 % (ref 0.0–3.0)
Eosinophils Absolute: 0.3 10*3/uL (ref 0.0–0.7)
Eosinophils Relative: 4.6 % (ref 0.0–5.0)
HCT: 35.1 % — ABNORMAL LOW (ref 36.0–46.0)
Hemoglobin: 11.4 g/dL — ABNORMAL LOW (ref 12.0–15.0)
Lymphocytes Relative: 14.6 % (ref 12.0–46.0)
Lymphs Abs: 1 10*3/uL (ref 0.7–4.0)
MCHC: 32.5 g/dL (ref 30.0–36.0)
MCV: 85.6 fL (ref 78.0–100.0)
Monocytes Absolute: 0.7 10*3/uL (ref 0.1–1.0)
Monocytes Relative: 10.9 % (ref 3.0–12.0)
Neutro Abs: 4.7 10*3/uL (ref 1.4–7.7)
Neutrophils Relative %: 69.3 % (ref 43.0–77.0)
Platelets: 236 10*3/uL (ref 150.0–400.0)
RBC: 4.1 Mil/uL (ref 3.87–5.11)
RDW: 13.2 % (ref 11.5–15.5)
WBC: 6.8 10*3/uL (ref 4.0–10.5)

## 2023-07-12 LAB — POC URINALSYSI DIPSTICK (AUTOMATED)
Bilirubin, UA: NEGATIVE
Blood, UA: NEGATIVE
Glucose, UA: NEGATIVE
Ketones, UA: NEGATIVE
Nitrite, UA: NEGATIVE
Protein, UA: NEGATIVE
Spec Grav, UA: 1.015 (ref 1.010–1.025)
Urobilinogen, UA: 0.2 U/dL
pH, UA: 6 (ref 5.0–8.0)

## 2023-07-12 LAB — BRAIN NATRIURETIC PEPTIDE: Pro B Natriuretic peptide (BNP): 88 pg/mL (ref 0.0–100.0)

## 2023-07-12 NOTE — Progress Notes (Signed)
Established Patient Office Visit  Subjective:  Patient ID: Bethany Grant, female    DOB: 1941/07/16  Age: 82 y.o. MRN: [id number]  Chief Complaint  Patient presents with   Follow-up    1 week follow up. She has not taken lipitor since ER visit and she does not feel any better.    Discussed the use of AI software for clinical note transcription with the patient who gave verbal consent to proceed.   82yo female presents today with her husband for a follow up visit. She has a history of rheumatoid arthritis, presented with a week-long history of generalized weakness, lethargy, and eye discomfort. She reported feeling 'drained' and 'not quite lucid' at times, with no specific muscle aches or pains. She also reported a decrease in urination frequency over the past week. The patient denied any burning sensation during urination or abnormal odor to the urine.  The patient had been recently treated in the emergency room for what was suspected to be acute renal failure and was given an injection of an antibiotic and sent home on Keflex. However, she reported not receiving any IV fluids during the visit. The patient's creatinine levels had jumped from 1.17 to 1.65, and her sodium levels had dropped to 129.  The patient also reported a history of a 'skip' in her heart rhythm and had undergone a CT of the chest in September. She is currently under the care of a Dr. Craige Cotta, pulmonologist.  In addition, the patient reported a recent episode of cervical lymphadenopathy, for which she was prescribed amoxicillin. She reported a recurrence of dry throat and pain on swallowing on the same side as the previous lymph node swelling.  Despite the ongoing treatment for a urinary tract infection, the patient reported no significant improvement in her symptoms. She reported a lack of appetite but denied any abdominal pain or nausea. She also reported a feeling of 'sickness' in her eyes but denied any pain or blurred or  double vision.  The patient's fluid intake had reportedly improved slightly over the weekend, mainly through drinking water, but her food intake remained low. She denied avoiding food due to pain.    Patient Active Problem List   Diagnosis Date Noted   COVID-19 03/10/2023   Lymphopenia 05/06/2021   Leukopenia 05/06/2021   Healthcare maintenance 02/05/2021   CAD (coronary artery disease) 08/08/2020   Thoracic aortic atherosclerosis (HCC) 08/08/2020   Primary insomnia 08/08/2020   Chronic pruritus 07/08/2020   Claw toe, acquired, right    Cough 04/21/2018   Nasal vestibulitis 04/21/2018   TMJ pain dysfunction syndrome 04/07/2018   Anosmia 03/04/2017   Abnormal EKG 12/20/2014   Other fatigue 12/20/2014   RBBB 12/20/2013   LAFB (left anterior fascicular block) 12/20/2013   Bifascicular block 12/20/2013   RA (rheumatoid arthritis) (HCC) 12/20/2013   Rheumatoid lung disease (HCC) 10/12/2013   Osteopenia 06/14/2013   Hypertension    RHINITIS 02/25/2009   ARTHRITIS, RHEUMATOID 05/03/2008   Past Medical History:  Diagnosis Date   Anxiety    Bradycardia    Sinus bradycardia with right bundle but block and LAFB-->avoid BBs and CCBs   Chronic renal insufficiency, stage 3 (moderate) (HCC)    GFR 40s   Emphysema lung (HCC)    noted on CT scanning   GERD (gastroesophageal reflux disease)    Hypercholesterolemia    Hypertension    Insomnia    Osteopenia 09/2017   T score -2.1 FRAX 23% and 8.3% (has RA).  Pulmonary nodule    followed by Dr. Craige Cotta   Rheumatoid arthritis(714.0)    hands and feet   Right knee pain    ortho->inj summer 2023 helpful   Thrombocytopenia (HCC)    mild, chronic   Social History   Tobacco Use   Smoking status: Former    Current packs/day: 0.00    Average packs/day: 0.8 packs/day for 30.0 years (24.0 ttl pk-yrs)    Types: Cigarettes    Start date: 07/27/1974    Quit date: 07/27/2004    Years since quitting: 18.9   Smokeless tobacco: Never  Vaping  Use   Vaping status: Never Used  Substance Use Topics   Alcohol use: No    Alcohol/week: 0.0 standard drinks of alcohol   Drug use: No      ROS: as noted in HPI  Objective:     BP 126/72   Pulse 62   Temp 97.9 F (36.6 C) (Oral)   Wt 158 lb (71.7 kg)   LMP  (LMP Unknown)   SpO2 100%   BMI 24.75 kg/m  BP Readings from Last 3 Encounters:  07/12/23 126/72  07/08/23 124/80  07/08/23 101/65   Wt Readings from Last 3 Encounters:  07/12/23 158 lb (71.7 kg)  07/08/23 158 lb (71.7 kg)  07/08/23 158 lb (71.7 kg)      Physical Exam Vitals and nursing note reviewed. Exam conducted with a chaperone present (husband).  Constitutional:      General: She is not in acute distress.    Appearance: Normal appearance. She is not ill-appearing, toxic-appearing or diaphoretic.  HENT:     Head: Normocephalic and atraumatic.     Right Ear: Tympanic membrane, ear canal and external ear normal. There is no impacted cerumen.     Left Ear: Tympanic membrane, ear canal and external ear normal. There is no impacted cerumen.     Nose: Nose normal.     Mouth/Throat:     Mouth: Mucous membranes are moist.     Pharynx: Oropharynx is clear. No oropharyngeal exudate or posterior oropharyngeal erythema.  Eyes:     General: No scleral icterus.       Right eye: No discharge.        Left eye: No discharge.     Extraocular Movements: Extraocular movements intact.     Pupils: Pupils are equal, round, and reactive to light.  Neck:     Thyroid: No thyroid mass, thyromegaly or thyroid tenderness.  Cardiovascular:     Rate and Rhythm: Normal rate and regular rhythm.     Pulses: Normal pulses.  Pulmonary:     Effort: Pulmonary effort is normal. No respiratory distress.     Breath sounds: Normal breath sounds. No stridor. No wheezing or rhonchi.  Musculoskeletal:        General: Deformity (chronic RA deformity noted to B hands) present.     Cervical back: Normal range of motion and neck supple. No  rigidity or tenderness.     Right lower leg: No edema.     Left lower leg: No edema.  Lymphadenopathy:     Cervical: No cervical adenopathy.     Right cervical: No superficial, deep or posterior cervical adenopathy.    Left cervical: No superficial, deep or posterior cervical adenopathy.  Skin:    General: Skin is warm and dry.     Coloration: Skin is not jaundiced.     Findings: No bruising, erythema or rash.  Neurological:  General: No focal deficit present.     Mental Status: She is alert and oriented to person, place, and time.     Sensory: No sensory deficit.     Motor: No weakness.  Psychiatric:        Mood and Affect: Mood normal.        Behavior: Behavior normal.      Results for orders placed or performed in visit on 07/12/23  POCT Urinalysis Dipstick (Automated)  Result Value Ref Range   Color, UA yellow    Clarity, UA Slighlty cloudy    Glucose, UA Negative Negative   Bilirubin, UA Negative    Ketones, UA Negative    Spec Grav, UA 1.015 1.010 - 1.025   Blood, UA negative    pH, UA 6.0 5.0 - 8.0   Protein, UA Negative Negative   Urobilinogen, UA 0.2 0.2 or 1.0 E.U./dL   Nitrite, UA negative    Leukocytes, UA Trace (A) Negative    Last CBC Lab Results  Component Value Date   WBC 7.0 07/08/2023   HGB 11.6 (L) 07/08/2023   HCT 36.0 07/08/2023   MCV 87.0 07/08/2023   MCH 28.0 07/08/2023   RDW 12.7 07/08/2023   PLT 163 07/08/2023   Last metabolic panel Lab Results  Component Value Date   GLUCOSE 149 (H) 07/08/2023   NA 129 (L) 07/08/2023   K 4.0 07/08/2023   CL 95 (L) 07/08/2023   CO2 23 07/08/2023   BUN 27 (H) 07/08/2023   CREATININE 1.63 (H) 07/08/2023   GFRNONAA 32 (L) 07/08/2023   CALCIUM 8.4 (L) 07/08/2023   PROT 6.5 07/08/2023   ALBUMIN 3.6 07/08/2023   LABGLOB 3.0 08/19/2021   AGRATIO 1.3 08/19/2021   BILITOT 0.5 07/08/2023   ALKPHOS 64 07/08/2023   AST 24 07/08/2023   ALT 14 07/08/2023   ANIONGAP 11 07/08/2023   Last thyroid  functions Lab Results  Component Value Date   TSH 5.74 (H) 07/08/2023   T3TOTAL 98 05/19/2023   T4TOTAL 7.2 05/19/2023   Last vitamin D Lab Results  Component Value Date   VD25OH 46 07/01/2015   Last vitamin B12 and Folate Lab Results  Component Value Date   VITAMINB12 543 08/19/2021   FOLATE 66.0 08/19/2021      The ASCVD Risk score (Arnett DK, et al., 2019) failed to calculate for the following reasons:   The 2019 ASCVD risk score is only valid for ages 35 to 59  Assessment & Plan:  Recurrent UTI -     POCT Urinalysis Dipstick (Automated)  Generalized weakness -     BASIC METABOLIC PANEL WITH eGFR -     Brain natriuretic peptide -     CBC with Differential/Platelet -     Lactic acid, plasma -     Osmolality  Hyponatremia -     BASIC METABOLIC PANEL WITH eGFR -     Brain natriuretic peptide -     CBC with Differential/Platelet -     Lactic acid, plasma -     Osmolality  Abnormal renal function test   Assessment & Plan Acute Renal Failure Creatinine increased from baseline 1.17 to 1.65. Likely due to dehydration. ER visit confirmed diagnosis but did not administer IV fluids. -Order labs to recheck kidney function and sodium levels given pt's reported increased PO intake.  Hyponatremia Sodium dropped to 129 in ER, cause unknown. Recheck level today, workup pending results. -Order labs to recheck sodium levels.  Urinary Tract  Infection IM rocephin given in ER. Improvement noted with Keflex 4 times daily. Urine culture sensitive to prescribed antibiotic. UA in office reassuring. -Continue Keflex as prescribed and finish course.  Generalized Weakness/Lethargy Ongoing symptoms of feeling weak and lethargic. No muscle aches or pain reported. Possible causes include recent UTI, acute renal failure, and hyponatremia. -Order labs to assess for systemic infection (lactic acid level).  Dry Throat and Difficulty Swallowing New symptom reported on the same side as  previous cervical lymphadenopathy. No visible swelling or abnormalities noted on examination. -Monitor symptoms.  Follow-up Pending lab results to be ordered as stat. Follow-up appointment to be scheduled based on lab results.  No follow-ups on file.   Maretta Bees, PA

## 2023-07-12 NOTE — Patient Instructions (Signed)
Continue to hold your cozaar. Please monitor your blood pressure at home.  Finish taking your keflex as prescribed by the ER.  We will call with the results of your labs to make any adjustments to your care plan as needed. Continue to increase your water intake.

## 2023-07-13 ENCOUNTER — Telehealth: Payer: Self-pay | Admitting: Family Medicine

## 2023-07-13 NOTE — Telephone Encounter (Signed)
Sheena just returned patients call based upon bloodwork results and will come back to see me next week. Thanks

## 2023-07-13 NOTE — Telephone Encounter (Signed)
Patient ask that PA Whitney giver her a call to review and go over her blood work.

## 2023-07-14 LAB — BASIC METABOLIC PANEL WITH GFR
BUN/Creatinine Ratio: 12 (calc) (ref 6–22)
BUN: 15 mg/dL (ref 7–25)
CO2: 28 mmol/L (ref 20–32)
Calcium: 8.6 mg/dL (ref 8.6–10.4)
Chloride: 101 mmol/L (ref 98–110)
Creat: 1.21 mg/dL — ABNORMAL HIGH (ref 0.60–0.95)
Glucose, Bld: 98 mg/dL (ref 65–99)
Potassium: 4.6 mmol/L (ref 3.5–5.3)
Sodium: 138 mmol/L (ref 135–146)
eGFR: 45 mL/min/{1.73_m2} — ABNORMAL LOW (ref >=60)

## 2023-07-14 LAB — OSMOLALITY: Osmolality: 284 mosm/kg (ref 278–305)

## 2023-07-14 LAB — LACTIC ACID, PLASMA: LACTIC ACID: 0.7 mmol/L (ref 0.4–1.8)

## 2023-07-19 ENCOUNTER — Ambulatory Visit: Payer: Medicare HMO | Admitting: Urgent Care

## 2023-07-19 ENCOUNTER — Encounter: Payer: Self-pay | Admitting: Urgent Care

## 2023-07-19 VITALS — BP 147/77 | HR 61 | Wt 158.0 lb

## 2023-07-19 DIAGNOSIS — I10 Essential (primary) hypertension: Secondary | ICD-10-CM

## 2023-07-19 DIAGNOSIS — D649 Anemia, unspecified: Secondary | ICD-10-CM | POA: Diagnosis not present

## 2023-07-19 DIAGNOSIS — E871 Hypo-osmolality and hyponatremia: Secondary | ICD-10-CM

## 2023-07-19 DIAGNOSIS — R195 Other fecal abnormalities: Secondary | ICD-10-CM

## 2023-07-19 MED ORDER — VALSARTAN 40 MG PO TABS: 40.0000 mg | ORAL_TABLET | Freq: Every day | ORAL | 3 refills | Status: DC

## 2023-07-19 MED ORDER — AMLODIPINE BESYLATE 2.5 MG PO TABS: 2.5000 mg | ORAL_TABLET | Freq: Every day | ORAL | 2 refills | Status: DC

## 2023-07-19 NOTE — Patient Instructions (Addendum)
Please stop your losartan  Instead, I would like to you to Valsartan 40mg . These medications are similar, but valsartan has a longer half life which means it stays active in the system longer.   Please also start taking amlodipine 2.5mg  daily.  Take both valsartan and amlodipine once daily in the morning. Monitor your blood pressure at least one hour after taking. Goal <140/90.  Do not take iron unless indicated by your labs.  Keep your office visit in January with Dr. Milinda Cave, sooner pending response to your blood pressure changes.

## 2023-07-19 NOTE — Progress Notes (Unsigned)
Established Patient Office Visit  Subjective:  Patient ID: Bethany Grant, female    DOB: [birth year]  Age: 82 y.o. MRN: [id number]  Chief Complaint  Patient presents with   Follow-up    1 week follow up. Wants to see if she needs to re start Losartan. She has started iron supplements which has been making her dizzy.    Timaya presents today for follow up due to concerns of continued weakness and dizziness, although she states sx have improved dramatically. She reports a recent decrease in appetite, but has since returned to normal eating habits. The patient has been experiencing a gradual decrease in hemoglobin levels, which she attempted to address by self-medicating with over-the-counter iron supplements. However, she has since discontinued this due to potential risks associated with excessive iron intake.  The patient has a history of hemorrhoids and has undergone hemorrhoid surgery in the past. However, the patient denies any visible blood in her stool. She brought in three fecal occult stool cards today, which were positive x 3.  The patient's strength has been gradually improving, but she is not yet back to her baseline. She continues to experience dizziness, particularly when standing. She denies lightheadedness, syncope or palpitations.  The patient has been monitoring her blood pressure at home, with readings varying from normal to high. She was instructed to stay off her losartan at her recent ER visit on 07/08/23. She just resumed taking it again on 07/16/23 and is concerned because she is still having elevated readings despite the medication.  The patient's renal function was previously off due to dehydration, but has since normalized. She was previously diagnosed with a urinary infection, but this has resolved and she is not currently experiencing any urinary symptoms.    Patient Active Problem List   Diagnosis Date Noted   Anemia 07/20/2023   Hyponatremia 07/20/2023   Fecal  occult blood test positive 07/20/2023   COVID-19 03/10/2023   Lymphopenia 05/06/2021   Leukopenia 05/06/2021   Healthcare maintenance 02/05/2021   CAD (coronary artery disease) 08/08/2020   Thoracic aortic atherosclerosis (HCC) 08/08/2020   Primary insomnia 08/08/2020   Chronic pruritus 07/08/2020   Claw toe, acquired, right    Cough 04/21/2018   Nasal vestibulitis 04/21/2018   TMJ pain dysfunction syndrome 04/07/2018   Anosmia 03/04/2017   Abnormal EKG 12/20/2014   Other fatigue 12/20/2014   RBBB 12/20/2013   LAFB (left anterior fascicular block) 12/20/2013   Bifascicular block 12/20/2013   RA (rheumatoid arthritis) (HCC) 12/20/2013   Rheumatoid lung disease (HCC) 10/12/2013   Osteopenia 06/14/2013   Essential hypertension    RHINITIS 02/25/2009   ARTHRITIS, RHEUMATOID 05/03/2008   Past Medical History:  Diagnosis Date   Anxiety    Bradycardia    Sinus bradycardia with right bundle but block and LAFB-->avoid BBs and CCBs   Chronic renal insufficiency, stage 3 (moderate) (HCC)    GFR 40s   Emphysema lung (HCC)    noted on CT scanning   GERD (gastroesophageal reflux disease)    Hypercholesterolemia    Hypertension    Insomnia    Osteopenia 09/2017   T score -2.1 FRAX 23% and 8.3% (has RA).   Pulmonary nodule    followed by Dr. Craige Cotta   Rheumatoid arthritis(714.0)    hands and feet   Right knee pain    ortho->inj summer 2023 helpful   Thrombocytopenia (HCC)    mild, chronic   Social History   Tobacco Use   Smoking status:  Former    Current packs/day: 0.00    Average packs/day: 0.8 packs/day for 30.0 years (24.0 ttl pk-yrs)    Types: Cigarettes    Start date: 07/27/1974    Quit date: 07/27/2004    Years since quitting: 18.9   Smokeless tobacco: Never  Vaping Use   Vaping status: Never Used  Substance Use Topics   Alcohol use: No    Alcohol/week: 0.0 standard drinks of alcohol   Drug use: No      ROS: as noted in HPI  Objective:     BP (!) 147/77    Pulse 61   Wt 158 lb (71.7 kg)   LMP  (LMP Unknown)   SpO2 100%   BMI 24.75 kg/m  BP Readings from Last 3 Encounters:  07/19/23 (!) 147/77  07/12/23 126/72  07/08/23 124/80   Wt Readings from Last 3 Encounters:  07/19/23 158 lb (71.7 kg)  07/12/23 158 lb (71.7 kg)  07/08/23 158 lb (71.7 kg)      Physical Exam Vitals and nursing note reviewed. Chaperone present: husband.  Constitutional:      General: She is not in acute distress.    Appearance: Normal appearance. She is not ill-appearing, toxic-appearing or diaphoretic.  HENT:     Head: Normocephalic and atraumatic.     Right Ear: Tympanic membrane, ear canal and external ear normal. There is no impacted cerumen.     Left Ear: Tympanic membrane, ear canal and external ear normal. There is no impacted cerumen.     Nose: Nose normal.     Mouth/Throat:     Mouth: Mucous membranes are moist.     Pharynx: Oropharynx is clear. No oropharyngeal exudate or posterior oropharyngeal erythema.  Eyes:     General: No scleral icterus.       Right eye: No discharge.        Left eye: No discharge.     Extraocular Movements: Extraocular movements intact.     Pupils: Pupils are equal, round, and reactive to light.  Neck:     Thyroid: No thyroid mass, thyromegaly or thyroid tenderness.  Cardiovascular:     Rate and Rhythm: Normal rate and regular rhythm.     Pulses: Normal pulses.  Pulmonary:     Effort: Pulmonary effort is normal. No respiratory distress.     Breath sounds: Normal breath sounds. No stridor. No wheezing or rhonchi.  Musculoskeletal:        General: Deformity (chronic RA deformity noted to B hands) present.     Cervical back: Normal range of motion and neck supple. No rigidity or tenderness.     Right lower leg: No edema.     Left lower leg: No edema.  Lymphadenopathy:     Cervical: No cervical adenopathy.     Right cervical: No superficial, deep or posterior cervical adenopathy.    Left cervical: No superficial,  deep or posterior cervical adenopathy.  Skin:    General: Skin is warm and dry.     Coloration: Skin is not jaundiced.     Findings: No bruising, erythema or rash.  Neurological:     General: No focal deficit present.     Mental Status: She is alert and oriented to person, place, and time. Mental status is at baseline.     Cranial Nerves: Cranial nerves 2-12 are intact. No cranial nerve deficit, dysarthria or facial asymmetry.     Sensory: Sensation is intact. No sensory deficit.     Motor:  Motor function is intact. No weakness or pronator drift.     Coordination: Coordination is intact. Romberg sign negative. Coordination normal.  Psychiatric:        Mood and Affect: Mood normal.        Behavior: Behavior normal.       The ASCVD Risk score (Arnett DK, et al., 2019) failed to calculate for the following reasons:   The 2019 ASCVD risk score is only valid for ages 6 to 7  Assessment & Plan:  Anemia, unspecified type Assessment & Plan: Anemia Gradual decrease in hemoglobin noted recently. Positive fecal occult blood test, but patient has a history of hemorrhoids which could cause a false positive. Patient self-started iron supplementation without confirmation of iron deficiency. -Discontinue over-the-counter iron until further testing. -Repeat CBC and check iron levels. -workup pending results of CBC today.  Orders: -     CBC with Differential/Platelet -     Iron, TIBC and Ferritin Panel -     Basic metabolic panel  Hyponatremia Assessment & Plan: Recheck today to ensure stability. Was 129 in ER, 138 upon recheck  Orders: -     Basic metabolic panel  Essential hypertension Assessment & Plan: Patient has inconsistent blood pressure readings, currently on Losartan. Many readings are still in the 140-150 systolic range. -Start Amlodipine 2.5mg  daily -Stop losartan - switch to valsartan 40mg  due to longer half-life. -continue monitoring BP at home, goal readings closer to  130/80; notify office if any consistent elevations noted.  Orders: -     amLODIPine Besylate; Take 1 tablet (2.5 mg total) by mouth daily.  Dispense: 30 tablet; Refill: 2 -     Valsartan; Take 1 tablet (40 mg total) by mouth daily.  Dispense: 90 tablet; Refill: 3 -     CBC with Differential/Platelet -     Basic metabolic panel  Fecal occult blood test positive Assessment & Plan: Question if related to known hx of hemorrhoids or if anemia is cause of pt's recent bout of weakness. She does admit to near resolution of this today however...   Orders: -     CBC with Differential/Platelet -     Iron, TIBC and Ferritin Panel   Suspect pt's previous bout of weakness was secondary to UTI which has since been resolved. Pt is nearly back to baseline in terms of appetite and energy levels. Recheck CBC today, only discuss/ workup positive fecal occult cards should Hgb continue to trend down and/or if iron abnormal. BP appears to be very labile - will switch to longer half-life medication, add low dose norvasc. F/U in one month, sooner as needed.  No follow-ups on file.   Maretta Bees, PA

## 2023-07-20 DIAGNOSIS — R195 Other fecal abnormalities: Secondary | ICD-10-CM | POA: Insufficient documentation

## 2023-07-20 DIAGNOSIS — E871 Hypo-osmolality and hyponatremia: Secondary | ICD-10-CM | POA: Insufficient documentation

## 2023-07-20 DIAGNOSIS — D649 Anemia, unspecified: Secondary | ICD-10-CM | POA: Insufficient documentation

## 2023-07-20 LAB — CBC WITH DIFFERENTIAL/PLATELET
Basophils Absolute: 0.1 10*3/uL (ref 0.0–0.1)
Basophils Relative: 1.3 % (ref 0.0–3.0)
Eosinophils Absolute: 0.3 10*3/uL (ref 0.0–0.7)
Eosinophils Relative: 5.3 % — ABNORMAL HIGH (ref 0.0–5.0)
HCT: 38.4 % (ref 36.0–46.0)
Hemoglobin: 12.2 g/dL (ref 12.0–15.0)
Lymphocytes Relative: 23.2 % (ref 12.0–46.0)
Lymphs Abs: 1.5 10*3/uL (ref 0.7–4.0)
MCHC: 31.8 g/dL (ref 30.0–36.0)
MCV: 86.6 fL (ref 78.0–100.0)
Monocytes Absolute: 0.6 10*3/uL (ref 0.1–1.0)
Monocytes Relative: 9.2 % (ref 3.0–12.0)
Neutro Abs: 3.9 10*3/uL (ref 1.4–7.7)
Neutrophils Relative %: 61 % (ref 43.0–77.0)
Platelets: 335 10*3/uL (ref 150.0–400.0)
RBC: 4.43 Mil/uL (ref 3.87–5.11)
RDW: 13.6 % (ref 11.5–15.5)
WBC: 6.4 10*3/uL (ref 4.0–10.5)

## 2023-07-20 LAB — IRON,TIBC AND FERRITIN PANEL
%SAT: 35 % (ref 16–45)
Ferritin: 29 ng/mL (ref 16–288)
Iron: 106 ug/dL (ref 45–160)
TIBC: 305 ug/dL (ref 250–450)

## 2023-07-20 LAB — BASIC METABOLIC PANEL
BUN: 16 mg/dL (ref 6–23)
CO2: 31 meq/L (ref 19–32)
Calcium: 9.2 mg/dL (ref 8.4–10.5)
Chloride: 97 meq/L (ref 96–112)
Creatinine, Ser: 1.31 mg/dL — ABNORMAL HIGH (ref 0.40–1.20)
GFR: 38.59 mL/min — ABNORMAL LOW (ref >60.00)
Glucose, Bld: 91 mg/dL (ref 70–99)
Potassium: 5.5 meq/L — ABNORMAL HIGH (ref 3.5–5.1)
Sodium: 135 meq/L (ref 135–145)

## 2023-07-20 NOTE — Assessment & Plan Note (Signed)
Question if related to known hx of hemorrhoids or if anemia is cause of pt's recent bout of weakness. She does admit to near resolution of this today however.Marland KitchenMarland Kitchen

## 2023-07-20 NOTE — Assessment & Plan Note (Signed)
Recheck today to ensure stability. Was 129 in ER, 138 upon recheck

## 2023-07-20 NOTE — Assessment & Plan Note (Signed)
Anemia Gradual decrease in hemoglobin noted recently. Positive fecal occult blood test, but patient has a history of hemorrhoids which could cause a false positive. Patient self-started iron supplementation without confirmation of iron deficiency. -Discontinue over-the-counter iron until further testing. -Repeat CBC and check iron levels. -workup pending results of CBC today.

## 2023-07-20 NOTE — Assessment & Plan Note (Signed)
Patient has inconsistent blood pressure readings, currently on Losartan. Many readings are still in the 140-150 systolic range. -Start Amlodipine 2.5mg  daily -Stop losartan - switch to valsartan 40mg  due to longer half-life. -continue monitoring BP at home, goal readings closer to 130/80; notify office if any consistent elevations noted.

## 2023-08-04 ENCOUNTER — Encounter: Payer: Self-pay | Admitting: Urgent Care

## 2023-08-04 ENCOUNTER — Ambulatory Visit: Payer: Medicare HMO | Admitting: Urgent Care

## 2023-08-04 VITALS — BP 128/56 | HR 53 | Temp 98.1°F | Wt 160.8 lb

## 2023-08-04 DIAGNOSIS — L282 Other prurigo: Secondary | ICD-10-CM

## 2023-08-04 DIAGNOSIS — R300 Dysuria: Secondary | ICD-10-CM

## 2023-08-04 DIAGNOSIS — K5900 Constipation, unspecified: Secondary | ICD-10-CM | POA: Diagnosis not present

## 2023-08-04 DIAGNOSIS — I10 Essential (primary) hypertension: Secondary | ICD-10-CM | POA: Diagnosis not present

## 2023-08-04 LAB — POCT URINALYSIS DIPSTICK
Bilirubin, UA: NEGATIVE
Blood, UA: POSITIVE
Glucose, UA: NEGATIVE
Ketones, UA: NEGATIVE
Leukocytes, UA: NEGATIVE
Nitrite, UA: NEGATIVE
Protein, UA: NEGATIVE
Spec Grav, UA: 1.01 (ref 1.010–1.025)
Urobilinogen, UA: 0.2 U/dL
pH, UA: 6 (ref 5.0–8.0)

## 2023-08-04 MED ORDER — BETAMETHASONE DIPROPIONATE 0.05 % EX CREA: TOPICAL_CREAM | Freq: Two times a day (BID) | CUTANEOUS | 0 refills | Status: DC

## 2023-08-04 NOTE — Assessment & Plan Note (Signed)
 Constipation Patient reports constipation, currently using Magnesium Citrate PRN. -Recommend daily use of MiraLAX, starting with one cap and adjusting as needed.

## 2023-08-04 NOTE — Assessment & Plan Note (Signed)
 Persistent rash in the armpits and legs, unresponsive to previous topical treatments. Possible allergic reaction. -Prescribe Betamethasone cream for 14 days. -If no improvement, consider punch biopsy.

## 2023-08-04 NOTE — Progress Notes (Signed)
 Established Patient Office Visit  Subjective:  Patient ID: Bethany Grant, female    DOB: 1940-08-28  Age: 83 y.o. MRN: [id number]  Chief Complaint  Patient presents with   Follow-up    2 week follow up on BP. She states she has been having itching all over her body for about 3 months now and she has been having constipation.   Discussed the use of AI software for clinical note transcription with the patient who gave verbal consent to proceed.   45 female with a history of hypertension and recent systemic infection, presents for a follow up regarding her persistent fatigue and a rash. She reports that the fatigue is almost nearly resolved, and was likely linked to her recent UTI in Dec. She is back to her normal activity level and eating/ drinking appropriately. The patient has been adherent to her antihypertensive medications, including Diovan  and amlodipine , and her blood pressure has been well-controlled. She has monitored it daily at home with all but one reading WNL.  The patient also reports a rash that has been present for several months. Despite attempts to manage it with CeraVe and a topical steroid, the rash has not improved. The rash is itchy and has been causing significant discomfort.  In addition to these symptoms, the patient has been experiencing constipation. She has tried various remedies, including eating apples and prunes, but these have not been effective. She has also been using magnesium citrate, which she believes may not be beneficial for her.  Lastly, the patient mentions occasional sensitivity during urination, although this is not consistent. She denies any pain or other urinary symptoms. She states she has increased her water  intake to roughly 48oz daily    Patient Active Problem List   Diagnosis Date Noted   Pruritic rash 08/04/2023   Constipation 08/04/2023   Dysuria 08/04/2023   Anemia 07/20/2023   Hyponatremia 07/20/2023   Fecal occult blood test  positive 07/20/2023   COVID-19 03/10/2023   Lymphopenia 05/06/2021   Leukopenia 05/06/2021   Healthcare maintenance 02/05/2021   CAD (coronary artery disease) 08/08/2020   Thoracic aortic atherosclerosis (HCC) 08/08/2020   Primary insomnia 08/08/2020   Chronic pruritus 07/08/2020   Claw toe, acquired, right    Cough 04/21/2018   Nasal vestibulitis 04/21/2018   TMJ pain dysfunction syndrome 04/07/2018   Anosmia 03/04/2017   Abnormal EKG 12/20/2014   Other fatigue 12/20/2014   RBBB 12/20/2013   LAFB (left anterior fascicular block) 12/20/2013   Bifascicular block 12/20/2013   RA (rheumatoid arthritis) (HCC) 12/20/2013   Rheumatoid lung disease (HCC) 10/12/2013   Osteopenia 06/14/2013   Essential hypertension    RHINITIS 02/25/2009   ARTHRITIS, RHEUMATOID 05/03/2008   Past Medical History:  Diagnosis Date   Anxiety    Bradycardia    Sinus bradycardia with right bundle but block and LAFB-->avoid BBs and CCBs   Chronic renal insufficiency, stage 3 (moderate) (HCC)    GFR 40s   Emphysema lung (HCC)    noted on CT scanning   GERD (gastroesophageal reflux disease)    Hypercholesterolemia    Hypertension    Insomnia    Osteopenia 09/2017   T score -2.1 FRAX 23% and 8.3% (has RA).   Pulmonary nodule    followed by Dr. Shellia   Rheumatoid arthritis(714.0)    hands and feet   Right knee pain    ortho->inj summer 2023 helpful   Thrombocytopenia (HCC)    mild, chronic   Past Surgical History:  Procedure Laterality Date   ABDOMINAL HYSTERECTOMY  1991   TAH/BSO   BARTHOLIN GLAND CYST EXCISION  1980   RIGHT   BREAST SURGERY  1988   BREAST CYST   COLONOSCOPY N/A 08/24/2013   Procedure: COLONOSCOPY;  Surgeon: Claudis RAYMOND Rivet, MD;  Location: [facility];  Service: Endoscopy;  Laterality: N/A;  100   COLONOSCOPY WITH PROPOFOL  N/A 09/09/2020   diverticulosis and hemorrhoids, o/w normal.  Procedure: COLONOSCOPY WITH PROPOFOL ;  Surgeon: Toledo, Ladell POUR, MD;  Location: [facility]  END[facility]COPY;  Service: Gastroenterology;  Laterality: N/A;   CT CORONARY CA SCORING  07/2020   29th%tile   ESOPHAGOGASTRODUODENOSCOPY (EGD) WITH PROPOFOL  N/A 09/09/2020   gastritis and erythematous duodenopathy. Procedure: ESOPHAGOGASTRODUODENOSCOPY (EGD) WITH PROPOFOL ;  Surgeon: Toledo, Ladell POUR, MD;  Location: [facility];  Service: Gastroenterology;  Laterality: N/A;   FOOT SURGERY     2 TIMES   FOOT SURGERY Left    HAND SURGERY     HEMORRHOID SURGERY     been over 20 years ago   OLECRANON BURSECTOMY Right 11/29/2013   Procedure: EXCISION OLECRANON BURSA RIGHT ;  Surgeon: Arley JONELLE Curia, MD;  Location: [facility];  Service: Orthopedics;  Laterality: Right;   OOPHORECTOMY     BSO   TENOSYNOVECTOMY Right 11/29/2013   Procedure: TENOSYNOVECTOMY FLEXOR TENDON RIGHT WRIST ;  Surgeon: Arley JONELLE Curia, MD;  Location:  SURGERY [facility]ENTER;  Service: Orthopedics;  Laterality: Right;   WEIL OSTEOTOMY Right 08/10/2018   Procedure: WEIL OSTEOTOMY 2, 3,4 METATARSAL RIGHT FOOT, PROXIMAL INTERPHALANGEAL RESECTION TOES 2, 3, 4;  Surgeon: Harden Jerona GAILS, MD;  Location: [facility];  Service: Orthopedics;  Laterality: Right;   Social History   Tobacco Use   Smoking status: Former    Current packs/day: 0.00    Average packs/day: 0.8 packs/day for 30.0 years (24.0 ttl pk-yrs)    Types: Cigarettes    Start date: 07/27/1974    Quit date: 07/27/2004    Years since quitting: 19.0   Smokeless tobacco: Never  Vaping Use   Vaping status: Never Used  Substance Use Topics   Alcohol use: No    Alcohol/week: 0.0 standard drinks of alcohol   Drug use: No      ROS: as noted in HPI  Objective:     BP (!) 128/56   Pulse (!) 53   Temp 98.1 F (36.7 C) (Oral)   Wt 160 lb 12.8 oz (72.9 kg)   LMP  (LMP Unknown)   SpO2 97%   BMI 25.18 kg/m  BP Readings from Last 3 Encounters:  08/04/23 (!) 128/56  07/19/23 (!) 147/77  07/12/23 126/72   Wt Readings from Last 3 Encounters:  08/04/23 160  lb 12.8 oz (72.9 kg)  07/19/23 158 lb (71.7 kg)  07/12/23 158 lb (71.7 kg)      Physical Exam Vitals and nursing note reviewed.  Constitutional:      General: She is not in acute distress.    Appearance: Normal appearance. She is not ill-appearing, toxic-appearing or diaphoretic.  HENT:     Head: Normocephalic and atraumatic.     Mouth/Throat:     Mouth: Mucous membranes are moist.     Pharynx: No oropharyngeal exudate or posterior oropharyngeal erythema.  Eyes:     General: No scleral icterus.       Right eye: No discharge.        Left eye: No discharge.     Extraocular Movements: Extraocular movements intact.  Pupils: Pupils are equal, round, and reactive to light.  Cardiovascular:     Rate and Rhythm: Normal rate and regular rhythm.  Pulmonary:     Effort: Pulmonary effort is normal. No respiratory distress.     Breath sounds: Normal breath sounds. No stridor. No wheezing or rhonchi.  Musculoskeletal:     Cervical back: Normal range of motion. No rigidity or tenderness.  Skin:    General: Skin is warm and dry.     Coloration: Skin is not jaundiced.     Findings: Rash (see photo) present. No bruising or erythema.  Neurological:     General: No focal deficit present.     Mental Status: She is alert and oriented to person, place, and time.     Gait: Gait normal.  Psychiatric:        Mood and Affect: Mood normal.        Behavior: Behavior normal.       Results for orders placed or performed in visit on 08/04/23  POCT urinalysis dipstick  Result Value Ref Range   Color, UA yellow    Clarity, UA clear    Glucose, UA Negative Negative   Bilirubin, UA negative    Ketones, UA negative    Spec Grav, UA 1.010 1.010 - 1.025   Blood, UA positive    pH, UA 6.0 5.0 - 8.0   Protein, UA Negative Negative   Urobilinogen, UA 0.2 0.2 or 1.0 E.U./dL   Nitrite, UA negative    Leukocytes, UA Negative Negative   Appearance     Odor      Last CBC Lab Results  Component  Value Date   WBC 6.4 07/19/2023   HGB 12.2 07/19/2023   HCT 38.4 07/19/2023   MCV 86.6 07/19/2023   MCH 28.0 07/08/2023   RDW 13.6 07/19/2023   PLT 335.0 07/19/2023   Last metabolic panel Lab Results  Component Value Date   GLUCOSE 91 07/19/2023   NA 135 07/19/2023   K 5.5 No hemolysis seen (H) 07/19/2023   CL 97 07/19/2023   CO2 31 07/19/2023   BUN 16 07/19/2023   CREATININE 1.31 (H) 07/19/2023   GFR 38.59 (L) 07/19/2023   CALCIUM  9.2 07/19/2023   PROT 6.5 07/08/2023   ALBUMIN 3.6 07/08/2023   LABGLOB 3.0 08/19/2021   AGRATIO 1.3 08/19/2021   BILITOT 0.5 07/08/2023   ALKPHOS 64 07/08/2023   AST 24 07/08/2023   ALT 14 07/08/2023   ANIONGAP 11 07/08/2023      The ASCVD Risk score (Arnett DK, et al., 2019) failed to calculate for the following reasons:   The 2019 ASCVD risk score is only valid for ages 21 to 15  Assessment & Plan:  Pruritic rash Assessment & Plan: Persistent rash in the armpits and legs, unresponsive to previous topical treatments. Possible allergic reaction. -Prescribe Betamethasone  cream for 14 days. -If no improvement, consider punch biopsy.  Orders: -     Betamethasone  Dipropionate; Apply topically 2 (two) times daily. Do not exceed 14 days consecutive use  Dispense: 30 g; Refill: 0  Essential hypertension Assessment & Plan: Hypertension Well controlled on Valsartan  40mg  and Amlodipine  2.5mg . No changes to current regimen. -Continue Valsartan  and Amlodipine  as prescribed.   Dysuria Assessment & Plan: Urinary Symptoms Patient reports occasional sensitivity during urination. -UA in office negative for leuks/ nitrates -urine culture given recent severe UTI and hx of recurrence  Orders: -     POCT urinalysis dipstick -  Urine Culture  Constipation, unspecified constipation type Assessment & Plan: Constipation Patient reports constipation, currently using Magnesium Citrate PRN. -Recommend daily use of MiraLAX, starting with one cap  and adjusting as needed.     Pt has follow up on 08/19/23 with Dr. McGowen. Will have all labs repeated at that time.  Return in about 15 days (around 08/19/2023).   Benton LITTIE Gave, PA

## 2023-08-04 NOTE — Patient Instructions (Addendum)
 I am glad you are feeling better!  Please continue to drink plenty of water  - four 16oz bottles daily is goal!  We checked your urine today and will contact you if any bacteria is present. Consider taking OTC cranberry caps daily to help with prevention of UTIs   For your bowel movements - please take one capful (17g) of miralax daily.  If after a week this has not improved, you may take two caps.  Start using betamethasone  cream twice daily to the affected skin. Do not use >14 consecutive days. Consider biopsy if rash continues.  Keep your office visit in Feb.

## 2023-08-04 NOTE — Assessment & Plan Note (Signed)
 Urinary Symptoms Patient reports occasional sensitivity during urination. -UA in office negative for leuks/ nitrates -urine culture given recent severe UTI and hx of recurrence

## 2023-08-04 NOTE — Assessment & Plan Note (Signed)
 Hypertension Well controlled on Valsartan 40mg  and Amlodipine 2.5mg . No changes to current regimen. -Continue Valsartan and Amlodipine as prescribed.

## 2023-08-06 ENCOUNTER — Encounter: Payer: Self-pay | Admitting: Urgent Care

## 2023-08-06 ENCOUNTER — Other Ambulatory Visit: Payer: Self-pay | Admitting: Urgent Care

## 2023-08-06 DIAGNOSIS — N390 Urinary tract infection, site not specified: Secondary | ICD-10-CM

## 2023-08-06 LAB — URINE CULTURE
MICRO NUMBER:: 15933898
SPECIMEN QUALITY:: ADEQUATE

## 2023-08-06 MED ORDER — CIPROFLOXACIN HCL 250 MG PO TABS: 250.0000 mg | ORAL_TABLET | Freq: Two times a day (BID) | ORAL | 0 refills | Status: DC

## 2023-08-06 MED ORDER — AMOXICILLIN-POT CLAVULANATE 500-125 MG PO TABS: 1.0000 | ORAL_TABLET | Freq: Two times a day (BID) | ORAL | 0 refills | Status: DC

## 2023-08-06 NOTE — Progress Notes (Signed)
 Pt refused to take cipro due to BBW. This was called in prior to receiving full sensitivity report. Luckily, current E coli strain sensitive to Augmentin, therefore will start this in place of the prior cipro order

## 2023-08-13 ENCOUNTER — Ambulatory Visit (INDEPENDENT_AMBULATORY_CARE_PROVIDER_SITE_OTHER): Payer: Medicare HMO | Admitting: Urgent Care

## 2023-08-13 ENCOUNTER — Encounter: Payer: Self-pay | Admitting: Urgent Care

## 2023-08-13 ENCOUNTER — Other Ambulatory Visit (HOSPITAL_COMMUNITY)
Admission: RE | Admit: 2023-08-13 | Discharge: 2023-08-13 | Disposition: A | Discharge status: Home / Self Care | Payer: Medicare HMO | Source: Ambulatory Visit | Attending: Urgent Care | Admitting: Urgent Care

## 2023-08-13 VITALS — BP 122/77 | HR 56 | Wt 159.6 lb

## 2023-08-13 DIAGNOSIS — N1831 Chronic kidney disease, stage 3a: Secondary | ICD-10-CM | POA: Insufficient documentation

## 2023-08-13 DIAGNOSIS — E875 Hyperkalemia: Secondary | ICD-10-CM

## 2023-08-13 DIAGNOSIS — L282 Other prurigo: Secondary | ICD-10-CM | POA: Insufficient documentation

## 2023-08-13 DIAGNOSIS — I10 Essential (primary) hypertension: Secondary | ICD-10-CM

## 2023-08-13 DIAGNOSIS — K5900 Constipation, unspecified: Secondary | ICD-10-CM

## 2023-08-13 DIAGNOSIS — E7800 Pure hypercholesterolemia, unspecified: Secondary | ICD-10-CM | POA: Diagnosis not present

## 2023-08-13 NOTE — Assessment & Plan Note (Signed)
Punch bx performed today - concern for lichen simplex chronicus vs grovers disease given appearance. Continue PRN topical steroid cream

## 2023-08-13 NOTE — Assessment & Plan Note (Signed)
5.5 on last labs. Recheck today

## 2023-08-13 NOTE — Assessment & Plan Note (Signed)
Has been stable overall - recheck today

## 2023-08-13 NOTE — Assessment & Plan Note (Signed)
Pt recently switched from 100mg  losartan to 40mg  valsartan and 2.5mg  amlodipine. Great control noted. Recent bout of hyerkalemia from labs - must ensure transient. If still elevated, will have to DC ARB. Recheck today.

## 2023-08-13 NOTE — Patient Instructions (Signed)
You had a punch biopsy completed today. We will notify you of the results. Please keep the area covered for the first 48 hours, changing the bandage every 8-12 hours and apply topical bacitracin to the area. Starting the night of 08/16/23, you can leave the area open at night, but keep covered during the day, still applying antibiotic ointment and using a non-stick pad.  We also obtained your routine labs.  Please keep your follow up appointment next week for lab review.

## 2023-08-13 NOTE — Assessment & Plan Note (Signed)
Appears to be resolving with use of bran cereal in the morning and daily prune juice

## 2023-08-13 NOTE — Progress Notes (Signed)
Established Patient Office Visit  Subjective:  Patient ID: Bethany Grant, female    DOB: 1940-08-04  Age: 83 y.o. MRN: [id number]  Chief Complaint  Patient presents with   Procedure    Pt in today for a punch biopsy of the skin    Pt presents today primarily to obtain a punch biopsy. She states she has had a very itchy rash for the past several months. The lesions respond to steroid cream, but then pop up in new locations. She is most bothered by this on the legs, but states it is on the abdomen and arms as well. Prescribed betamethasone last visit which has helped some, but not cured it.  Additionally, pt had been complaining of constipation. She states she is now eating bran cereal and drinking prune juice and denies any need for the miralax. This has caused regulation of bowel movements.  Pt has recently had some BP fluctuations, medications recently changed after a hospitalization due to concerns of her ARB causing her issues at the time. She did restart it, however was switched from 100mg  losartan to valsartan 40mg  and amlodipine 2.5mg , with excellent BP control noted today.  Pt does have CKD which appears stable. Noted to have 5.5 K+ level on last labs - will recheck today. No additional complaints.    Patient Active Problem List   Diagnosis Date Noted   Hyperkalemia 08/13/2023   Chronic renal impairment, stage 3a (HCC) 08/13/2023   Pruritic rash 08/04/2023   Constipation 08/04/2023   Dysuria 08/04/2023   Anemia 07/20/2023   Hyponatremia 07/20/2023   Fecal occult blood test positive 07/20/2023   COVID-19 03/10/2023   Lymphopenia 05/06/2021   Leukopenia 05/06/2021   Healthcare maintenance 02/05/2021   CAD (coronary artery disease) 08/08/2020   Thoracic aortic atherosclerosis (HCC) 08/08/2020   Primary insomnia 08/08/2020   Chronic pruritus 07/08/2020   Claw toe, acquired, right    Cough 04/21/2018   Nasal vestibulitis 04/21/2018   TMJ pain dysfunction syndrome  04/07/2018   Anosmia 03/04/2017   Abnormal EKG 12/20/2014   Other fatigue 12/20/2014   RBBB 12/20/2013   LAFB (left anterior fascicular block) 12/20/2013   Bifascicular block 12/20/2013   RA (rheumatoid arthritis) (HCC) 12/20/2013   Rheumatoid lung disease (HCC) 10/12/2013   Osteopenia 06/14/2013   Essential hypertension    RHINITIS 02/25/2009   ARTHRITIS, RHEUMATOID 05/03/2008   Past Medical History:  Diagnosis Date   Anxiety    Bradycardia    Sinus bradycardia with right bundle but block and LAFB-->avoid BBs and CCBs   Chronic renal insufficiency, stage 3 (moderate) (HCC)    GFR 40s   Emphysema lung (HCC)    noted on CT scanning   GERD (gastroesophageal reflux disease)    Hypercholesterolemia    Hypertension    Insomnia    Osteopenia 09/2017   T score -2.1 FRAX 23% and 8.3% (has RA).   Pulmonary nodule    followed by Dr. Craige Cotta   Rheumatoid arthritis(714.0)    hands and feet   Right knee pain    ortho->inj summer 2023 helpful   Thrombocytopenia (HCC)    mild, chronic   Past Surgical History:  Procedure Laterality Date   ABDOMINAL HYSTERECTOMY  1991   TAH/BSO   BARTHOLIN GLAND CYST EXCISION  1980   RIGHT   BREAST SURGERY  1988   BREAST CYST   COLONOSCOPY N/A 08/24/2013   Procedure: COLONOSCOPY;  Surgeon: Malissa Hippo, MD;  Location: [facility];  Service:  Endoscopy;  Laterality: N/A;  100   COLONOSCOPY WITH PROPOFOL N/A 09/09/2020   diverticulosis and hemorrhoids, o/w normal.  Procedure: COLONOSCOPY WITH PROPOFOL;  Surgeon: Toledo, Boykin Nearing, MD;  Location: [facility];  Service: Gastroenterology;  Laterality: N/A;   CT CORONARY CA SCORING  07/2020   29th%tile   ESOPHAGOGASTRODUODENOSCOPY (EGD) WITH PROPOFOL N/A 09/09/2020   gastritis and erythematous duodenopathy. Procedure: ESOPHAGOGASTRODUODENOSCOPY (EGD) WITH PROPOFOL;  Surgeon: Toledo, Boykin Nearing, MD;  Location: [facility];  Service: Gastroenterology;  Laterality: N/A;   FOOT SURGERY     2 TIMES    FOOT SURGERY Left    HAND SURGERY     HEMORRHOID SURGERY     been over 20 years ago   OLECRANON BURSECTOMY Right 11/29/2013   Procedure: EXCISION OLECRANON BURSA RIGHT ;  Surgeon: Nicki Reaper, MD;  Location: [facility];  Service: Orthopedics;  Laterality: Right;   OOPHORECTOMY     BSO   TENOSYNOVECTOMY Right 11/29/2013   Procedure: TENOSYNOVECTOMY FLEXOR TENDON RIGHT WRIST ;  Surgeon: Nicki Reaper, MD;  Location: [facility];  Service: Orthopedics;  Laterality: Right;   WEIL OSTEOTOMY Right 08/10/2018   Procedure: WEIL OSTEOTOMY 2, 3,4 METATARSAL RIGHT FOOT, PROXIMAL INTERPHALANGEAL RESECTION TOES 2, 3, 4;  Surgeon: Nadara Mustard, MD;  Location: [facility];  Service: Orthopedics;  Laterality: Right;   Social History   Tobacco Use   Smoking status: Former    Current packs/day: 0.00    Average packs/day: 0.8 packs/day for 30.0 years (24.0 ttl pk-yrs)    Types: Cigarettes    Start date: 07/27/1974    Quit date: 07/27/2004    Years since quitting: 19.0   Smokeless tobacco: Never  Vaping Use   Vaping status: Never Used  Substance Use Topics   Alcohol use: No    Alcohol/week: 0.0 standard drinks of alcohol   Drug use: No      ROS: as noted in HPI  Objective:     BP 122/77   Pulse (!) 56   Wt 159 lb 9.6 oz (72.4 kg)   LMP  (LMP Unknown)   SpO2 96%   BMI 25.00 kg/m  BP Readings from Last 3 Encounters:  08/13/23 122/77  08/04/23 (!) 128/56  07/19/23 (!) 147/77   Wt Readings from Last 3 Encounters:  08/13/23 159 lb 9.6 oz (72.4 kg)  08/04/23 160 lb 12.8 oz (72.9 kg)  07/19/23 158 lb (71.7 kg)      Physical Exam Vitals and nursing note reviewed.  Constitutional:      General: She is not in acute distress.    Appearance: Normal appearance. She is not ill-appearing, toxic-appearing or diaphoretic.  HENT:     Head: Normocephalic.  Cardiovascular:     Rate and Rhythm: Normal rate.  Pulmonary:     Effort: Pulmonary effort is normal. No  respiratory distress.  Musculoskeletal:        General: Deformity (ulnar deviation hands) present.  Skin:    General: Skin is warm and dry.     Findings: Rash (generalized maculopapular rash across body, worse on extremities. Some excoriations noted. No ulcerations, pustules or drainage.) present.  Neurological:     General: No focal deficit present.     Mental Status: She is alert and oriented to person, place, and time.      Skin excision  Date/Time: 08/13/2023 2:09 PM  Performed by: Maretta Bees, PA Authorized by: Maretta Bees, PA   Number of  Lesions: 1 Lesion 1:    Body area: lower extremity   Lower extremity location: L lower leg   Initial size (mm): 3   Final defect size (mm): 3   Malignancy: benign lesion     Destruction method comment: 3mm punch biopsy   Repair comments: 3mm punch bx performed. Very shallow, no indication for closure. Pt left with topical abx ointment, telfa pad and coban wrap    No results found for any visits on 08/13/23.  Last CBC Lab Results  Component Value Date   WBC 6.4 07/19/2023   HGB 12.2 07/19/2023   HCT 38.4 07/19/2023   MCV 86.6 07/19/2023   MCH 28.0 07/08/2023   RDW 13.6 07/19/2023   PLT 335.0 07/19/2023   Last metabolic panel Lab Results  Component Value Date   GLUCOSE 91 07/19/2023   NA 135 07/19/2023   K 5.5 No hemolysis seen (H) 07/19/2023   CL 97 07/19/2023   CO2 31 07/19/2023   BUN 16 07/19/2023   CREATININE 1.31 (H) 07/19/2023   GFR 38.59 (L) 07/19/2023   CALCIUM 9.2 07/19/2023   PROT 6.5 07/08/2023   ALBUMIN 3.6 07/08/2023   LABGLOB 3.0 08/19/2021   AGRATIO 1.3 08/19/2021   BILITOT 0.5 07/08/2023   ALKPHOS 64 07/08/2023   AST 24 07/08/2023   ALT 14 07/08/2023   ANIONGAP 11 07/08/2023      The ASCVD Risk score (Arnett DK, et al., 2019) failed to calculate for the following reasons:   The 2019 ASCVD risk score is only valid for ages 70 to 72  Assessment & Plan:  Pruritic rash Assessment &  Plan: Punch bx performed today - concern for lichen simplex chronicus vs grovers disease given appearance. Continue PRN topical steroid cream  Orders: -     Skin biopsy; Future -     Surgical pathology -     Skin excision  Chronic renal impairment, stage 3a (HCC) Assessment & Plan: Has been stable overall - recheck today  Orders: -     CBC with Differential/Platelet -     Comprehensive metabolic panel  Hypercholesterolemia -     Lipid panel  Essential hypertension Assessment & Plan: Pt recently switched from 100mg  losartan to 40mg  valsartan and 2.5mg  amlodipine. Great control noted. Recent bout of hyerkalemia from labs - must ensure transient. If still elevated, will have to DC ARB. Recheck today.  Orders: -     CBC with Differential/Platelet -     Hemoglobin A1c -     TSH  Constipation, unspecified constipation type Assessment & Plan: Appears to be resolving with use of bran cereal in the morning and daily prune juice   Hyperkalemia Assessment & Plan: 5.5 on last labs. Recheck today  Orders: -     Comprehensive metabolic panel     No follow-ups on file.   Maretta Bees, PA

## 2023-08-14 LAB — HEMOGLOBIN A1C
Hgb A1c MFr Bld: 5.8 %{Hb} — ABNORMAL HIGH (ref <5.7)
Mean Plasma Glucose: 120 mg/dL
eAG (mmol/L): 6.6 mmol/L

## 2023-08-14 LAB — COMPREHENSIVE METABOLIC PANEL
AG Ratio: 1.4 (calc) (ref 1.0–2.5)
ALT: 8 U/L (ref 6–29)
AST: 15 U/L (ref 10–35)
Albumin: 4.3 g/dL (ref 3.6–5.1)
Alkaline phosphatase (APISO): 76 U/L (ref 37–153)
BUN/Creatinine Ratio: 18 (calc) (ref 6–22)
BUN: 22 mg/dL (ref 7–25)
CO2: 28 mmol/L (ref 20–32)
Calcium: 9.2 mg/dL (ref 8.6–10.4)
Chloride: 98 mmol/L (ref 98–110)
Creat: 1.23 mg/dL — ABNORMAL HIGH (ref 0.60–0.95)
Globulin: 3.1 g/dL (ref 1.9–3.7)
Glucose, Bld: 91 mg/dL (ref 65–99)
Potassium: 4.9 mmol/L (ref 3.5–5.3)
Sodium: 135 mmol/L (ref 135–146)
Total Bilirubin: 0.3 mg/dL (ref 0.2–1.2)
Total Protein: 7.4 g/dL (ref 6.1–8.1)

## 2023-08-14 LAB — CBC WITH DIFFERENTIAL/PLATELET
Absolute Lymphocytes: 1896 {cells}/uL (ref 850–3900)
Absolute Monocytes: 872 {cells}/uL (ref 200–950)
Basophils Absolute: 72 {cells}/uL (ref 0–200)
Basophils Relative: 0.9 %
Eosinophils Absolute: 680 {cells}/uL — ABNORMAL HIGH (ref 15–500)
Eosinophils Relative: 8.5 %
HCT: 38.4 % (ref 35.0–45.0)
Hemoglobin: 12.5 g/dL (ref 11.7–15.5)
MCH: 27.4 pg (ref 27.0–33.0)
MCHC: 32.6 g/dL (ref 32.0–36.0)
MCV: 84.2 fL (ref 80.0–100.0)
MPV: 12.5 fL (ref 7.5–12.5)
Monocytes Relative: 10.9 %
Neutro Abs: 4480 {cells}/uL (ref 1500–7800)
Neutrophils Relative %: 56 %
Platelets: 163 10*3/uL (ref 140–400)
RBC: 4.56 10*6/uL (ref 3.80–5.10)
RDW: 12.8 % (ref 11.0–15.0)
Total Lymphocyte: 23.7 %
WBC: 8 10*3/uL (ref 3.8–10.8)

## 2023-08-14 LAB — LIPID PANEL
Cholesterol: 123 mg/dL (ref <200)
HDL: 56 mg/dL (ref >=50)
LDL Cholesterol (Calc): 53 mg/dL
Non-HDL Cholesterol (Calc): 67 mg/dL (ref <130)
Total CHOL/HDL Ratio: 2.2 (calc) (ref <5.0)
Triglycerides: 65 mg/dL (ref <150)

## 2023-08-14 LAB — TSH: TSH: 7.33 m[IU]/L — ABNORMAL HIGH (ref 0.40–4.50)

## 2023-08-19 ENCOUNTER — Encounter: Payer: Self-pay | Admitting: Family Medicine

## 2023-08-19 ENCOUNTER — Ambulatory Visit (INDEPENDENT_AMBULATORY_CARE_PROVIDER_SITE_OTHER): Payer: Medicare HMO | Admitting: Family Medicine

## 2023-08-19 VITALS — BP 138/82 | HR 60 | Wt 158.6 lb

## 2023-08-19 DIAGNOSIS — E7800 Pure hypercholesterolemia, unspecified: Secondary | ICD-10-CM

## 2023-08-19 DIAGNOSIS — F5101 Primary insomnia: Secondary | ICD-10-CM | POA: Diagnosis not present

## 2023-08-19 DIAGNOSIS — L299 Pruritus, unspecified: Secondary | ICD-10-CM | POA: Diagnosis not present

## 2023-08-19 DIAGNOSIS — I10 Essential (primary) hypertension: Secondary | ICD-10-CM

## 2023-08-19 DIAGNOSIS — N1831 Chronic kidney disease, stage 3a: Secondary | ICD-10-CM

## 2023-08-19 MED ORDER — ALPRAZOLAM 0.5 MG PO TABS: ORAL_TABLET | ORAL | 1 refills | Status: DC

## 2023-08-19 NOTE — Progress Notes (Signed)
OFFICE VISIT  08/19/2023  CC:  Chief Complaint  Patient presents with   Medical Management of Chronic Issues    Pt is fasting.     Patient is a 83 y.o. female who presents for 6-month follow-up hypertension, insomnia, chronic renal insufficiency stage III, and hyperlipidemia. A/P as of last visit: "1) HTN, well controlled on losartan 100 every day. Lytes/cr today.   #2 anxiety related insomnia. Will with long-term nightly use of alprazolam 0.5 mg. No refills needed today. Controlled substance contract and urine drug screen are up-to-date.   #3 hyperlipidemia, doing well on rosuvastatin 10 mg a day. LDL was 59 about 3 months ago. Plan repeat lipid panel 3 months.   #4 CRI III. She avoids NSAIDs. Lytes/cr today.   #5 cold intolerance, chronic. Check TSH and iron panel.   6.  Pruritus without rash. Encouraged good skin moisturization. Okay to use nonsedating antihistamine as needed."  INTERIM HX: Bethany Grant feels well. She feels like her itchy skin is better lately.  No actual rashes present.  Labs all stable 5 days ago.  Been an adjustment in her blood pressure regimen since I saw her last, possibly due to hyperkalemia detected while she was in the ED for urinary concerns: She is now on valsartan 40mg  every day and amlodipine 2.5mg  every day for her HTN. Most recent potassium 4.9.  Home blood pressures on this regimen consistently normal.  PMP AWARE reviewed today: most recent rx for alprazolam 0.5 mg was filled 06/15/2023, # 90, rx by me. No red flags.  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    Pulmonary nodule    followed by Dr. Craige Cotta   Rheumatoid arthritis(714.0)    hands and feet   Right knee pain    ortho->inj summer 2023 helpful   Thrombocytopenia (HCC)    mild, chronic    Past Surgical History:  Procedure Laterality Date   ABDOMINAL HYSTERECTOMY  1991   TAH/BSO   BARTHOLIN GLAND CYST EXCISION  1980   RIGHT   BREAST SURGERY  1988   BREAST CYST   COLONOSCOPY N/A 08/24/2013   Procedure: COLONOSCOPY;  Surgeon: Malissa Hippo, MD;  [facility];  Service: Endoscopy;  Laterality: N/A;  100   COLONOSCOPY WITH PROPOFOL N/A 09/09/2020   diverticulosis and hemorrhoids, o/w normal.  Procedure: COLONOSCOPY WITH PROPOFOL;  Surgeon: Toledo, Boykin Nearing, MD;  Location: [facility];  Service: Gastroenterology;  Laterality: N/A;   CT CORONARY CA SCORING  07/2020   29th%tile   ESOPHAGOGASTRODUODENOSCOPY (EGD) WITH PROPOFOL N/A 09/09/2020   gastritis and erythematous duodenopathy. Procedure: ESOPHAGOGASTRODUODENOSCOPY (EGD) WITH PROPOFOL;  Surgeon: Toledo, Boykin Nearing, MD;  Location: [facility];  Service: Gastroenterology;  Laterality: N/A;   FOOT SURGERY     2 TIMES   FOOT SURGERY Left    HAND SURGERY     HEMORRHOID SURGERY     been over 20 years ago   OLECRANON BURSECTOMY Right 11/29/2013   Procedure: EXCISION OLECRANON BURSA RIGHT ;  Surgeon: Nicki Reaper,  MD;  Location: [facility];  Service: Orthopedics;  Laterality: Right;   OOPHORECTOMY     BSO   TENOSYNOVECTOMY Right 11/29/2013   Procedure: TENOSYNOVECTOMY FLEXOR TENDON RIGHT WRIST ;  Surgeon: Nicki Reaper, MD;  [facility];  Service: Orthopedics;  Laterality: Right;   WEIL OSTEOTOMY Right 08/10/2018   Procedure: WEIL OSTEOTOMY 2, 3,4 METATARSAL RIGHT FOOT, PROXIMAL INTERPHALANGEAL RESECTION TOES 2, 3, 4;  Surgeon: Nadara Mustard, MD;  Location: [facility];  Service: Orthopedics;  Laterality: Right;    Outpatient Medications Prior to Visit  Medication Sig Dispense  Refill   Acetaminophen (TYLENOL PO) Take by mouth as needed.     amLODipine (NORVASC) 2.5 MG tablet Take 1 tablet (2.5 mg total) by mouth daily. 30 tablet 2   Ascorbic Acid (VITAMIN C PO) Take 500 mg by mouth daily.     aspirin EC 81 MG tablet Take 1 tablet (81 mg total) by mouth daily. Swallow whole. 90 tablet 3   betamethasone dipropionate 0.05 % cream Apply topically 2 (two) times daily. Do not exceed 14 days consecutive use 30 g 0   Cholecalciferol (VITAMIN D3) 25 MCG (1000 UT) CAPS Take 1,000 Units by mouth daily.     ciclopirox (LOPROX) 0.77 % cream as needed.     Cyanocobalamin (VITAMIN B-12 PO) Take 1,000 mg by mouth daily.     cycloSPORINE (RESTASIS) 0.05 % ophthalmic emulsion Place 1 drop into both eyes 2 (two) times daily.     etanercept (ENBREL SURECLICK) 50 MG/ML injection      fluticasone (FLONASE) 50 MCG/ACT nasal spray Place 1 spray into both nostrils daily. 16 g 2   pantoprazole (PROTONIX) 40 MG tablet Take 1 tablet (40 mg total) by mouth daily. 90 tablet 1   rosuvastatin (CRESTOR) 10 MG tablet Take 1 tablet (10 mg total) by mouth daily. 90 tablet 1   valsartan (DIOVAN) 40 MG tablet Take 1 tablet (40 mg total) by mouth daily. 90 tablet 3   ALPRAZolam (XANAX) 0.5 MG tablet 1 tab po qhs 30 tablet 0   No facility-administered medications prior to visit.    Allergies  Allergen Reactions   Other Nausea And Vomiting and Other (See Comments)    Doesn't do well with pain  Medications, sensitivity to tape   Hydrocodone-Acetaminophen Nausea And Vomiting and Other (See Comments)   Trazodone And Nefazodone Other (See Comments)    Fatigue/hangover effect    Review of Systems As per HPI  PE:    08/19/2023   10:01 AM 08/19/2023    9:54 AM 08/13/2023    1:35 PM  Vitals with BMI  Weight  158 lbs 10 oz 159 lbs 10 oz  Systolic 138 151 578  Diastolic 82 70 77  Pulse  60 56     Physical Exam  Gen: Alert, well appearing.  Patient is oriented to person, place, time, and  situation. AFFECT: pleasant, lucid thought and speech. No further exam today  LABS:  Last CBC Lab Results  Component Value Date   WBC 8.0 08/13/2023   HGB 12.5 08/13/2023   HCT 38.4 08/13/2023   MCV 84.2 08/13/2023   MCH 27.4 08/13/2023   RDW 12.8 08/13/2023   PLT 163 08/13/2023   Last metabolic panel Lab Results  Component Value Date   GLUCOSE 91 08/13/2023   NA 135 08/13/2023   K 4.9 08/13/2023   CL 98 08/13/2023   CO2 28 08/13/2023   BUN 22 08/13/2023  CREATININE 1.23 (H) 08/13/2023   GFR 38.59 (L) 07/19/2023   CALCIUM 9.2 08/13/2023   PROT 7.4 08/13/2023   ALBUMIN 3.6 07/08/2023   LABGLOB 3.0 08/19/2021   AGRATIO 1.3 08/19/2021   BILITOT 0.3 08/13/2023   ALKPHOS 64 07/08/2023   AST 15 08/13/2023   ALT 8 08/13/2023   ANIONGAP 11 07/08/2023   Last lipids Lab Results  Component Value Date   CHOL 123 08/13/2023   HDL 56 08/13/2023   LDLCALC 53 08/13/2023   TRIG 65 08/13/2023   CHOLHDL 2.2 08/13/2023   Last hemoglobin A1c Lab Results  Component Value Date   HGBA1C 5.8 (H) 08/13/2023   Last thyroid functions Lab Results  Component Value Date   TSH 7.33 (H) 08/13/2023   T3TOTAL 98 05/19/2023   T4TOTAL 7.2 05/19/2023   Last vitamin D Lab Results  Component Value Date   VD25OH 46 07/01/2015   Last vitamin B12 and Folate Lab Results  Component Value Date   VITAMINB12 543 08/19/2021   FOLATE 66.0 08/19/2021   IMPRESSION AND PLAN:  1) HTN, well controlled on valsartan 40 mg a day and amlodipine 2.5 mg a day. Most recent potassium 4.9 about a week ago.  Creatinine was stable at 1.23.   #2 anxiety related insomnia. Will with long-term nightly use of alprazolam 0.5 mg. #90 with 1 refill today. Controlled substance contract and urine drug screen are up-to-date.   #3 hyperlipidemia, doing well on rosuvastatin 10 mg a day. LDL was 53 about 1 week ago. Plan repeat lipid panel 6 months.   #4 CRI III. She avoids NSAIDs. Electrolytes normal and  serum creatinine stable 1 week ago.  6.  Pruritus without rash. Encouraged good skin moisturization. Okay to use nonsedating antihistamine as needed. Skin biopsy that was done 08/13/2023 by Guy Sandifer, PA is in process.  An After Visit Summary was printed and given to the patient.  FOLLOW UP: No follow-ups on file. Next CPE 01/2024 Signed:  Santiago Bumpers, MD           08/19/2023

## 2023-08-20 ENCOUNTER — Telehealth: Payer: Self-pay | Admitting: Pulmonary Disease

## 2023-08-20 DIAGNOSIS — J849 Interstitial pulmonary disease, unspecified: Secondary | ICD-10-CM

## 2023-08-20 LAB — SURGICAL PATHOLOGY

## 2023-08-20 NOTE — Telephone Encounter (Signed)
Patient states needs referral to Dr. Craige Cotta at [facility]. Patient phone number is [phone].

## 2023-08-25 NOTE — Telephone Encounter (Signed)
Referral placed to pulm at [facility] with Sood.

## 2023-08-31 ENCOUNTER — Telehealth (HOSPITAL_BASED_OUTPATIENT_CLINIC_OR_DEPARTMENT_OTHER): Payer: Self-pay | Admitting: Pulmonary Disease

## 2023-08-31 NOTE — Telephone Encounter (Signed)
Patient states called [facility] where Dr. Craige Cotta is now located and they have not received the referral for patient. Looks like the referral was put in correctly on 08/25/23.The fax numbers I could find were [phone] and [phone]  Please advise.

## 2023-09-24 ENCOUNTER — Other Ambulatory Visit (HOSPITAL_COMMUNITY): Payer: Self-pay | Admitting: Family Medicine

## 2023-09-24 DIAGNOSIS — Z1231 Encounter for screening mammogram for malignant neoplasm of breast: Secondary | ICD-10-CM

## 2023-10-06 ENCOUNTER — Ambulatory Visit (HOSPITAL_COMMUNITY)
Admission: RE | Admit: 2023-10-06 | Discharge: 2023-10-06 | Disposition: A | Discharge status: Home / Self Care | Payer: Medicare HMO | Source: Ambulatory Visit | Attending: Family Medicine | Admitting: Family Medicine

## 2023-10-06 DIAGNOSIS — Z1231 Encounter for screening mammogram for malignant neoplasm of breast: Secondary | ICD-10-CM | POA: Diagnosis present

## 2023-10-08 ENCOUNTER — Other Ambulatory Visit: Payer: Self-pay

## 2023-10-08 DIAGNOSIS — I10 Essential (primary) hypertension: Secondary | ICD-10-CM

## 2023-10-08 MED ORDER — AMLODIPINE BESYLATE 2.5 MG PO TABS: 2.5000 mg | ORAL_TABLET | Freq: Every day | ORAL | 1 refills | Status: DC

## 2023-11-11 DIAGNOSIS — H04129 Dry eye syndrome of unspecified lacrimal gland: Secondary | ICD-10-CM | POA: Insufficient documentation

## 2023-11-17 ENCOUNTER — Encounter: Payer: Self-pay | Admitting: Family Medicine

## 2023-11-17 ENCOUNTER — Ambulatory Visit (INDEPENDENT_AMBULATORY_CARE_PROVIDER_SITE_OTHER): Payer: Medicare HMO | Admitting: Family Medicine

## 2023-11-17 VITALS — BP 132/69 | HR 53 | Temp 98.2°F | Ht 67.0 in | Wt 159.8 lb

## 2023-11-17 DIAGNOSIS — R7989 Other specified abnormal findings of blood chemistry: Secondary | ICD-10-CM | POA: Diagnosis not present

## 2023-11-17 DIAGNOSIS — I10 Essential (primary) hypertension: Secondary | ICD-10-CM

## 2023-11-17 DIAGNOSIS — N1831 Chronic kidney disease, stage 3a: Secondary | ICD-10-CM | POA: Diagnosis not present

## 2023-11-17 DIAGNOSIS — L299 Pruritus, unspecified: Secondary | ICD-10-CM

## 2023-11-17 DIAGNOSIS — F5101 Primary insomnia: Secondary | ICD-10-CM

## 2023-11-17 DIAGNOSIS — M542 Cervicalgia: Secondary | ICD-10-CM

## 2023-11-17 NOTE — Progress Notes (Signed)
 OFFICE VISIT  11/17/2023  CC:  Chief Complaint  Patient presents with   Medical Management of Chronic Issues    Pt is fasting    Patient is a 83 y.o. female who presents for 43-month follow-up hypertension, insomnia, and chronic renal insufficiency. A/P as of last visit: "1) HTN, well controlled on valsartan  40 mg a day and amlodipine  2.5 mg a day. Most recent potassium 4.9 about a week ago.  Creatinine was stable at 1.23.   #2 anxiety related insomnia. Will with long-term nightly use of alprazolam  0.5 mg. #90 with 1 refill today. Controlled substance contract and urine drug screen are up-to-date.   #3 hyperlipidemia, doing well on rosuvastatin  10 mg a day. LDL was 53 about 1 week ago. Plan repeat lipid panel 6 months.   #4 CRI III. She avoids NSAIDs. Electrolytes normal and serum creatinine stable 1 week ago.   6.  Pruritus without rash. Encouraged good skin moisturization. Okay to use nonsedating antihistamine as needed. Skin biopsy that was done 08/13/2023 by Corita Diego, PA is in process."  INTERIM HX: Bethany Grant feels well other than ongoing pain in her hands and feet from her rheumatoid arthritis. She is also still bothered quite a bit by chronic itching.  She describes a faint bumpy rash on and off but she does not have any present today. She has an appointment with her dermatologist next month.  She requests a thyroid  lab today because she read that thyroid  disease can cause a rash and itching. 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  She does have some topical steroid she can use as needed--> she mainly uses this on the axillary folds..  Lately has been having some pains in her posterolateral soft tissues of her neck bilaterally. She has been doing more lifting with yard work lately. Denies radiation of the pain across her shoulders or down her arms.  No paresthesias. No arm weakness.  PMP AWARE reviewed today: most recent rx for alprazolam   was filled 10/07/2023, # 90, rx by me. No red flags.  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   Pulmonary nodule    followed by Dr. Matilde Son   Rheumatoid arthritis(714.0)    hands and feet   Right knee pain    ortho->inj summer 2023 helpful   Thrombocytopenia (HCC)    mild, chronic    Past Surgical History:  Procedure Laterality Date   ABDOMINAL HYSTERECTOMY  1991   TAH/BSO   BARTHOLIN GLAND CYST EXCISION  1980   RIGHT   BREAST SURGERY  1988   BREAST CYST   COLONOSCOPY N/A 08/24/2013   Procedure: COLONOSCOPY;  Surgeon: Ruby Corporal, MD;  Location: [facility];  Service: Endoscopy;  Laterality: N/A;  100   COLONOSCOPY WITH PROPOFOL  N/A 09/09/2020   diverticulosis and hemorrhoids, o/w normal.  Procedure: COLONOSCOPY WITH PROPOFOL ;  Surgeon: Toledo, Alphonsus Jeans, MD;  Location: [facility];  Service: Gastroenterology;  Laterality: N/A;   CT CORONARY CA SCORING  07/2020   29th%tile  ESOPHAGOGASTRODUODENOSCOPY (EGD) WITH PROPOFOL  N/A 09/09/2020   gastritis and erythematous duodenopathy. Procedure: ESOPHAGOGASTRODUODENOSCOPY (EGD) WITH PROPOFOL ;  Surgeon: Toledo, Alphonsus Jeans, MD;  Location: [facility];  Service: Gastroenterology;  Laterality: N/A;   FOOT SURGERY     2 TIMES   FOOT SURGERY Left    HAND SURGERY     HEMORRHOID SURGERY     been over 20 years ago   OLECRANON BURSECTOMY Right 11/29/2013   Procedure: EXCISION OLECRANON BURSA RIGHT ;  Surgeon: Kemp Patter, MD;  Location: [facility];  Service: Orthopedics;  Laterality: Right;   OOPHORECTOMY     BSO   TENOSYNOVECTOMY Right  11/29/2013   Procedure: TENOSYNOVECTOMY FLEXOR TENDON RIGHT WRIST ;  Surgeon: Kemp Patter, MD;  Location: [facility];  Service: Orthopedics;  Laterality: Right;   WEIL OSTEOTOMY Right 08/10/2018   Procedure: WEIL OSTEOTOMY 2, 3,4 METATARSAL RIGHT FOOT, PROXIMAL INTERPHALANGEAL RESECTION TOES 2, 3, 4;  Surgeon: Timothy Ford, MD;  Location: [facility];  Service: Orthopedics;  Laterality: Right;    Outpatient Medications Prior to Visit  Medication Sig Dispense Refill   Acetaminophen  (TYLENOL  PO) Take by mouth as needed.     ALPRAZolam  (XANAX ) 0.5 MG tablet 1 tab po qhs 90 tablet 1   amLODipine  (NORVASC ) 2.5 MG tablet Take 1 tablet (2.5 mg total) by mouth daily. 90 tablet 1   Ascorbic Acid (VITAMIN C PO) Take 500 mg by mouth daily.     aspirin  EC 81 MG tablet Take 1 tablet (81 mg total) by mouth daily. Swallow whole. 90 tablet 3   Cholecalciferol (VITAMIN D3) 25 MCG (1000 UT) CAPS Take 1,000 Units by mouth daily.     ciclopirox (LOPROX) 0.77 % cream as needed.     Cyanocobalamin (VITAMIN B-12 PO) Take 1,000 mg by mouth daily.     cycloSPORINE (RESTASIS) 0.05 % ophthalmic emulsion Place 1 drop into both eyes 2 (two) times daily.     etanercept (ENBREL SURECLICK) 50 MG/ML injection      fluticasone  (FLONASE ) 50 MCG/ACT nasal spray Place 1 spray into both nostrils daily. 16 g 2   pantoprazole  (PROTONIX ) 40 MG tablet Take 1 tablet (40 mg total) by mouth daily. 90 tablet 1   rosuvastatin  (CRESTOR ) 10 MG tablet Take 1 tablet (10 mg total) by mouth daily. 90 tablet 1   valsartan  (DIOVAN ) 40 MG tablet Take 1 tablet (40 mg total) by mouth daily. 90 tablet 3   betamethasone  dipropionate 0.05 % cream Apply topically 2 (two) times daily. Do not exceed 14 days consecutive use (Patient not taking: Reported on 11/17/2023) 30 g 0   No facility-administered medications prior to visit.    Allergies  Allergen Reactions   Other Nausea And Vomiting and Other (See Comments)    Doesn't do well with  pain  Medications, sensitivity to tape   Hydrocodone-Acetaminophen  Nausea And Vomiting and Other (See Comments)   Trazodone  And Nefazodone Other (See Comments)    Fatigue/hangover effect    Review of Systems As per HPI  PE:    11/17/2023    9:38 AM 08/19/2023   10:01 AM 08/19/2023    9:54 AM  Vitals with BMI  Height 5\' 7"     Weight 159 lbs 13 oz  158 lbs 10 oz  BMI 25.02    Systolic 132 138 409  Diastolic 69 82 70  Pulse 53  60     Physical Exam  Gen: Alert, well appearing.  Patient is  oriented to person, place, time, and situation. AFFECT: pleasant, lucid thought and speech. Ears: No significant cerumen  TMs normal. Neck: Carotids 2+ bilaterally, no bruits. She has no tenderness to palpation of the neck.  She has mild discomfort over the sternocleidomastoid region diffusely on both sides when turning far to the left or far to the right. No tenderness over the occiput. Range of motion of the neck is intact except mild limitation with extension.  LABS:  Last CBC Lab Results  Component Value Date   WBC 8.0 08/13/2023   HGB 12.5 08/13/2023   HCT 38.4 08/13/2023   MCV 84.2 08/13/2023   MCH 27.4 08/13/2023   RDW 12.8 08/13/2023   PLT 163 08/13/2023   Last metabolic panel Lab Results  Component Value Date   GLUCOSE 91 08/13/2023   NA 135 08/13/2023   K 4.9 08/13/2023   CL 98 08/13/2023   CO2 28 08/13/2023   BUN 22 08/13/2023   CREATININE 1.23 (H) 08/13/2023   GFR 38.59 (L) 07/19/2023   CALCIUM  9.2 08/13/2023   PROT 7.4 08/13/2023   ALBUMIN 3.6 07/08/2023   LABGLOB 3.0 08/19/2021   AGRATIO 1.3 08/19/2021   BILITOT 0.3 08/13/2023   ALKPHOS 64 07/08/2023   AST 15 08/13/2023   ALT 8 08/13/2023   ANIONGAP 11 07/08/2023   Last lipids Lab Results  Component Value Date   CHOL 123 08/13/2023   HDL 56 08/13/2023   LDLCALC 53 08/13/2023   TRIG 65 08/13/2023   CHOLHDL 2.2 08/13/2023   Last hemoglobin A1c Lab Results  Component Value Date   HGBA1C 5.8 (H)  08/13/2023   Last thyroid  functions Lab Results  Component Value Date   TSH 7.33 (H) 08/13/2023   T3TOTAL 98 05/19/2023   T4TOTAL 7.2 05/19/2023   Last vitamin D  Lab Results  Component Value Date   VD25OH 46 07/01/2015   Last vitamin B12 and Folate Lab Results  Component Value Date   VITAMINB12 543 08/19/2021   FOLATE 66.0 08/19/2021   Lab Results  Component Value Date   HGBA1C 5.8 (H) 08/13/2023   IMPRESSION AND PLAN:  1) HTN, well controlled on valsartan  40 mg a day and amlodipine  2.5 mg a day. Check electrolytes and creatinine today.   #2 anxiety related insomnia. Will with long-term nightly use of alprazolam  0.5 mg. #90 with 1 refill today. Controlled substance contract and urine drug screen are up-to-date.   #3 hyperlipidemia, doing well on rosuvastatin  10 mg a day. LDL was 53 about 3 months ago. Plan repeat lipid panel 3 months.   #4 CRI III. She avoids NSAIDs. Monitor basic metabolic panel today.   5.  Pruritus without rash. 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 Encouraged good skin moisturization. Okay to use nonsedating antihistamine as needed. She has follow-up with her dermatologist next month. Will do thyroid  panel today (she has had mild TSH elevation with normal free T4 and T3 total for the last 5 months).  #6 musculoskeletal neck pain. Reassured. Heat application and range of motion exercises encouraged. Avoid heavy lifting.  An After Visit Summary was printed and given to the patient.  FOLLOW UP: Return in about 3 months (around 02/16/2024) for routine chronic illness f/u. Next CPE 01/2024 Signed:  Arletha Lady, MD           11/17/2023

## 2023-11-18 ENCOUNTER — Encounter: Payer: Self-pay | Admitting: Family Medicine

## 2023-11-18 LAB — BASIC METABOLIC PANEL WITH GFR
BUN/Creatinine Ratio: 13 (calc) (ref 6–22)
BUN: 14 mg/dL (ref 7–25)
CO2: 29 mmol/L (ref 20–32)
Calcium: 9.1 mg/dL (ref 8.6–10.4)
Chloride: 102 mmol/L (ref 98–110)
Creat: 1.06 mg/dL — ABNORMAL HIGH (ref 0.60–0.95)
Glucose, Bld: 84 mg/dL (ref 65–99)
Potassium: 5 mmol/L (ref 3.5–5.3)
Sodium: 138 mmol/L (ref 135–146)
eGFR: 53 mL/min/{1.73_m2} — ABNORMAL LOW (ref >=60)

## 2023-11-18 LAB — T3: T3, Total: 98 ng/dL (ref 76–181)

## 2023-11-18 LAB — T4, FREE: Free T4: 1 ng/dL (ref 0.8–1.8)

## 2023-11-18 LAB — TSH: TSH: 5.57 m[IU]/L — ABNORMAL HIGH (ref 0.40–4.50)

## 2024-02-16 ENCOUNTER — Ambulatory Visit (INDEPENDENT_AMBULATORY_CARE_PROVIDER_SITE_OTHER): Admitting: Family Medicine

## 2024-02-16 ENCOUNTER — Ambulatory Visit (HOSPITAL_BASED_OUTPATIENT_CLINIC_OR_DEPARTMENT_OTHER)
Admission: RE | Admit: 2024-02-16 | Discharge: 2024-02-16 | Disposition: A | Discharge status: Home / Self Care | Source: Ambulatory Visit | Attending: Family Medicine | Admitting: Family Medicine

## 2024-02-16 ENCOUNTER — Encounter: Payer: Self-pay | Admitting: Family Medicine

## 2024-02-16 VITALS — BP 118/60 | HR 51 | Temp 98.4°F | Ht 67.0 in | Wt 157.6 lb

## 2024-02-16 DIAGNOSIS — Z79899 Other long term (current) drug therapy: Secondary | ICD-10-CM

## 2024-02-16 DIAGNOSIS — F99 Mental disorder, not otherwise specified: Secondary | ICD-10-CM

## 2024-02-16 DIAGNOSIS — N1831 Chronic kidney disease, stage 3a: Secondary | ICD-10-CM

## 2024-02-16 DIAGNOSIS — M542 Cervicalgia: Secondary | ICD-10-CM | POA: Insufficient documentation

## 2024-02-16 DIAGNOSIS — E7800 Pure hypercholesterolemia, unspecified: Secondary | ICD-10-CM

## 2024-02-16 DIAGNOSIS — F5105 Insomnia due to other mental disorder: Secondary | ICD-10-CM | POA: Diagnosis not present

## 2024-02-16 DIAGNOSIS — I10 Essential (primary) hypertension: Secondary | ICD-10-CM

## 2024-02-16 MED ORDER — PANTOPRAZOLE SODIUM 40 MG PO TBEC: 40.0000 mg | DELAYED_RELEASE_TABLET | Freq: Every day | ORAL | 3 refills | Status: DC

## 2024-02-16 MED ORDER — ALPRAZOLAM 0.5 MG PO TABS: ORAL_TABLET | ORAL | 1 refills | Status: DC

## 2024-02-16 NOTE — Progress Notes (Signed)
 OFFICE VISIT  02/16/2024  CC:  Chief Complaint  Patient presents with   Medical Management of Chronic Issues    Patient is a 83 y.o. female who presents for 35-month follow-up of hypertension, hypercholesterolemia, chronic renal insufficiency stage III, and anxiety-related insomnia. A/P as of last visit: 1) HTN, well controlled on valsartan  40 mg a day and amlodipine  2.5 mg a day. Check electrolytes and creatinine today.   #2 anxiety related insomnia. Will with long-term nightly use of alprazolam  0.5 mg. #90 with 1 refill today. Controlled substance contract and urine drug screen are up-to-date.   #3 hyperlipidemia, doing well on rosuvastatin  10 mg a day. LDL was 53 about 3 months ago. Plan repeat lipid panel 3 months.   #4 CRI III. She avoids NSAIDs. Monitor basic metabolic panel today.   5.  Pruritus without rash. 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 Encouraged good skin moisturization. Okay to use nonsedating antihistamine as needed. She has follow-up with her dermatologist next month. Will do thyroid  panel today (she has had mild TSH elevation with normal free T4 and T3 total for the last 5 months).   #6 musculoskeletal neck pain. Reassured. Heat application and range of motion exercises encouraged. Avoid heavy lifting.  INTERIM HX: She feels well other than still having intermittent pain in the area of the left anterolateral neck.  She feels it deep, describes as sharp and brief.  It is not a constant dull ache.  She does not have any dysphagia. She has no fever or abnormal weight loss.  She has good appetite. She saw Dr. Jesus in ENT 2 days ago for evaluation of this area and was reassured that the likely cause is her neck osteoarthritis.  She says that when she feels her usual arthritis pain in her neck it is located in posterior aspect and gets better when she gets her Enbrel infusions.   PMP AWARE reviewed today: most recent rx for alprazolam   0.5 mg was filled 01/03/2024, # 90, rx by me. No red flags.  ROS as above, plus--> no fevers, no CP, no SOB, no wheezing, no cough, no dizziness, no HAs, no rashes, no melena/hematochezia.  No polyuria or polydipsia.  No myalgias or arthralgias.  No focal weakness, paresthesias, or tremors.  No acute vision or hearing abnormalities.  No dysuria or unusual/new urinary urgency or frequency.  No recent changes in lower legs. No n/v/d or abd pain.  No palpitations.    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   Pulmonary nodule    followed by Dr. Shellia   Rheumatoid arthritis(714.0)    hands and feet   Right knee pain    ortho->inj summer 2023 helpful   Thrombocytopenia (HCC)    mild, chronic    Past Surgical History:  Procedure Laterality Date   ABDOMINAL HYSTERECTOMY  1991   TAH/BSO   BARTHOLIN GLAND CYST EXCISION  1980   RIGHT   BREAST SURGERY  1988   BREAST CYST   COLONOSCOPY N/A 08/24/2013  Procedure: COLONOSCOPY;  Surgeon: Claudis RAYMOND Rivet, MD;  Location: [facility];  Service: Endoscopy;  Laterality: N/A;  100   COLONOSCOPY WITH PROPOFOL  N/A 09/09/2020   diverticulosis and hemorrhoids, o/w normal.  Procedure: COLONOSCOPY WITH PROPOFOL ;  Surgeon: Toledo, Ladell POUR, MD;  Location: [facility];  Service: Gastroenterology;  Laterality: N/A;   CT CORONARY CA SCORING  07/2020   29th%tile   ESOPHAGOGASTRODUODENOSCOPY (EGD) WITH PROPOFOL  N/A 09/09/2020   gastritis and erythematous duodenopathy. Procedure: ESOPHAGOGASTRODUODENOSCOPY (EGD) WITH PROPOFOL ;  Surgeon: Toledo, Ladell POUR, MD;  Location: [facility];  Service: Gastroenterology;  Laterality: N/A;   FOOT SURGERY     2 TIMES   FOOT SURGERY Left    HAND SURGERY     HEMORRHOID SURGERY     been over 20 years ago   OLECRANON BURSECTOMY Right 11/29/2013   Procedure: EXCISION OLECRANON BURSA RIGHT ;  Surgeon: Arley JONELLE Curia, MD;  Location: [facility];  Service: Orthopedics;  Laterality: Right;   OOPHORECTOMY     BSO   TENOSYNOVECTOMY Right 11/29/2013   Procedure: TENOSYNOVECTOMY FLEXOR TENDON RIGHT WRIST ;  Surgeon: Arley JONELLE Curia, MD;  Location: [facility];  Service: Orthopedics;  Laterality: Right;   WEIL OSTEOTOMY Right 08/10/2018   Procedure: WEIL OSTEOTOMY 2, 3,4 METATARSAL RIGHT FOOT, PROXIMAL INTERPHALANGEAL RESECTION TOES 2, 3, 4;  Surgeon: Harden Jerona GAILS, MD;  Location: [facility];  Service: Orthopedics;  Laterality: Right;    Outpatient Medications Prior to Visit  Medication Sig Dispense Refill   Acetaminophen  (TYLENOL  PO) Take by mouth as needed.     amLODipine  (NORVASC ) 2.5 MG tablet Take 1 tablet (2.5 mg total) by mouth daily. 90 tablet 1   Ascorbic Acid (VITAMIN C PO) Take 500 mg by mouth daily.     aspirin  EC 81 MG tablet Take 1 tablet (81 mg total) by mouth daily. Swallow whole. 90 tablet 3   Cholecalciferol (VITAMIN D3) 25 MCG (1000 UT) CAPS Take 1,000 Units by mouth daily.     ciclopirox (LOPROX) 0.77 % cream as needed.     Cyanocobalamin (VITAMIN B-12 PO) Take 1,000 mg by mouth daily.     cycloSPORINE (RESTASIS) 0.05 % ophthalmic emulsion Place 1 drop into both eyes 2 (two) times daily.     etanercept (ENBREL SURECLICK) 50 MG/ML injection      fluticasone  (FLONASE ) 50 MCG/ACT nasal spray Place 1 spray into both nostrils daily. 16 g 2   rosuvastatin  (CRESTOR ) 10 MG tablet Take 1 tablet (10 mg total) by mouth daily. 90 tablet 1   valsartan  (DIOVAN ) 40 MG tablet Take 1 tablet (40 mg total) by mouth daily. 90 tablet 3   pantoprazole  (PROTONIX ) 40 MG tablet Take 1 tablet (40 mg total) by mouth  daily. 90 tablet 1   betamethasone  dipropionate 0.05 % cream Apply topically 2 (two) times daily. Do not exceed 14 days consecutive use (Patient not taking: Reported on 02/16/2024) 30 g 0   ALPRAZolam  (XANAX ) 0.5 MG tablet 1 tab po qhs 90 tablet 1   No facility-administered medications prior to visit.    Allergies  Allergen Reactions   Other Nausea And Vomiting and Other (See Comments)    Doesn't do well with pain  Medications, sensitivity to tape   Hydrocodone-Acetaminophen  Nausea And Vomiting and Other (See Comments)   Trazodone  And Nefazodone Other (See Comments)    Fatigue/hangover effect    Review of Systems As per HPI  PE:    02/16/2024  9:43 AM 02/16/2024    9:22 AM 11/17/2023    9:38 AM  Vitals with BMI  Height  5' 7 5' 7  Weight  157 lbs 10 oz 159 lbs 13 oz  BMI  24.68 25.02  Systolic 118 150 867  Diastolic 60 64 69  Pulse  51 53     Physical Exam  Gen: Alert, well appearing.  Patient is oriented to person, place, time, and situation. AFFECT: pleasant, lucid thought and speech. Face, jaw, anterior neck, and lateral neck appear normal. The area of her intermittent pain is located right over the left carotid pulsation but is not tender to palpation. Neck range of motion full intact.  No bruits. No palpable lymph node.  Thyroid  nonpalpable.  LABS:  Last CBC Lab Results  Component Value Date   WBC 8.0 08/13/2023   HGB 12.5 08/13/2023   HCT 38.4 08/13/2023   MCV 84.2 08/13/2023   MCH 27.4 08/13/2023   RDW 12.8 08/13/2023   PLT 163 08/13/2023   Last metabolic panel Lab Results  Component Value Date   GLUCOSE 84 11/17/2023   NA 138 11/17/2023   K 5.0 11/17/2023   CL 102 11/17/2023   CO2 29 11/17/2023   BUN 14 11/17/2023   CREATININE 1.06 (H) 11/17/2023   EGFR 53 (L) 11/17/2023   CALCIUM  9.1 11/17/2023   PROT 7.4 08/13/2023   ALBUMIN 3.6 07/08/2023   LABGLOB 3.0 08/19/2021   AGRATIO 1.3 08/19/2021   BILITOT 0.3 08/13/2023   ALKPHOS 64  07/08/2023   AST 15 08/13/2023   ALT 8 08/13/2023   ANIONGAP 11 07/08/2023   Last lipids Lab Results  Component Value Date   CHOL 123 08/13/2023   HDL 56 08/13/2023   LDLCALC 53 08/13/2023   TRIG 65 08/13/2023   CHOLHDL 2.2 08/13/2023   Last hemoglobin A1c Lab Results  Component Value Date   HGBA1C 5.8 (H) 08/13/2023   Last thyroid  functions Lab Results  Component Value Date   TSH 5.57 (H) 11/17/2023   T3TOTAL 98 11/17/2023   T4TOTAL 7.2 05/19/2023   Last vitamin D  Lab Results  Component Value Date   VD25OH 46 07/01/2015   Last vitamin B12 and Folate Lab Results  Component Value Date   VITAMINB12 543 08/19/2021   FOLATE 66.0 08/19/2021   IMPRESSION AND PLAN:  #1 neck pain, left anterolateral area.  Unknown etiology. This is not consistent with typical osteoarthritic neck pain. It has been going on at least for months now.  Will go ahead and get a neck soft tissue ultrasound. If anything suspicious is found that may need CT scan evaluation then we will have to make sure contrast is minimized.  2) HTN, well controlled on valsartan  40 mg a day and amlodipine  2.5 mg a day. Check electrolytes and creatinine today.   3) anxiety related insomnia. Will continue with long-term nightly use of alprazolam  0.5 mg. Controlled substance contract updated.  Urine drug screen obtained today.   #4 hyperlipidemia, doing well on rosuvastatin  10 mg a day. LDL was 53 about 6 months ago. Lipid and hepatic panel today.   #5 CRI III. She avoids NSAIDs. Monitor basic metabolic panel today.  An After Visit Summary was printed and given to the patient.  FOLLOW UP: Return in about 3 months (around 05/18/2024) for routine chronic illness f/u.  Signed:  Gerlene Hockey, MD           02/16/2024

## 2024-02-16 NOTE — Patient Instructions (Signed)

## 2024-02-17 ENCOUNTER — Ambulatory Visit: Payer: Self-pay | Admitting: Family Medicine

## 2024-02-18 LAB — DRUG MONITORING PANEL 376104, URINE

## 2024-02-18 LAB — COMPREHENSIVE METABOLIC PANEL WITH GFR
AG Ratio: 1.5 (calc) (ref 1.0–2.5)
ALT: 11 U/L (ref 6–29)
AST: 20 U/L (ref 10–35)
Albumin: 4.5 g/dL (ref 3.6–5.1)
Alkaline phosphatase (APISO): 81 U/L (ref 37–153)
BUN/Creatinine Ratio: 14 (calc) (ref 6–22)
BUN: 15 mg/dL (ref 7–25)
CO2: 28 mmol/L (ref 20–32)
Calcium: 9.7 mg/dL (ref 8.6–10.4)
Chloride: 100 mmol/L (ref 98–110)
Creat: 1.11 mg/dL — ABNORMAL HIGH (ref 0.60–0.95)
Globulin: 3 g/dL (ref 1.9–3.7)
Glucose, Bld: 89 mg/dL (ref 65–99)
Potassium: 4.9 mmol/L (ref 3.5–5.3)
Sodium: 138 mmol/L (ref 135–146)
Total Bilirubin: 0.5 mg/dL (ref 0.2–1.2)
Total Protein: 7.5 g/dL (ref 6.1–8.1)
eGFR: 50 mL/min/1.73m2 — ABNORMAL LOW (ref >=60)

## 2024-02-18 LAB — LIPID PANEL
Cholesterol: 138 mg/dL (ref <200)
HDL: 59 mg/dL (ref >=50)
LDL Cholesterol (Calc): 63 mg/dL
Non-HDL Cholesterol (Calc): 79 mg/dL (ref <130)
Total CHOL/HDL Ratio: 2.3 (calc) (ref <5.0)
Triglycerides: 77 mg/dL (ref <150)

## 2024-02-18 LAB — DM TEMPLATE

## 2024-02-21 ENCOUNTER — Telehealth: Payer: Self-pay

## 2024-02-21 NOTE — Telephone Encounter (Signed)
 Communication  Reason for CRM: Patient called in to check on the status of the results from her neck US . Patient stated that she would love to hear something back today if provider has already reviewed the results.    Called and spoke with pt. Pt was advised that we had not yet received the results for the imaging but when we did and her provider had reviewed them, we would notify her. Pt understood and had no further questions.

## 2024-02-24 ENCOUNTER — Encounter: Payer: Self-pay | Admitting: Family Medicine

## 2024-02-24 ENCOUNTER — Ambulatory Visit (HOSPITAL_BASED_OUTPATIENT_CLINIC_OR_DEPARTMENT_OTHER)
Admission: RE | Admit: 2024-02-24 | Discharge: 2024-02-24 | Disposition: A | Discharge status: Home / Self Care | Source: Ambulatory Visit | Attending: Family Medicine | Admitting: Family Medicine

## 2024-02-24 ENCOUNTER — Ambulatory Visit (INDEPENDENT_AMBULATORY_CARE_PROVIDER_SITE_OTHER): Admitting: Family Medicine

## 2024-02-24 ENCOUNTER — Ambulatory Visit: Payer: Self-pay | Admitting: Family Medicine

## 2024-02-24 VITALS — BP 130/80 | HR 63 | Temp 97.6°F | Wt 158.0 lb

## 2024-02-24 DIAGNOSIS — R0981 Nasal congestion: Secondary | ICD-10-CM | POA: Diagnosis not present

## 2024-02-24 DIAGNOSIS — R0989 Other specified symptoms and signs involving the circulatory and respiratory systems: Secondary | ICD-10-CM | POA: Diagnosis present

## 2024-02-24 DIAGNOSIS — J209 Acute bronchitis, unspecified: Secondary | ICD-10-CM

## 2024-02-24 DIAGNOSIS — R051 Acute cough: Secondary | ICD-10-CM | POA: Insufficient documentation

## 2024-02-24 LAB — POC COVID19 BINAXNOW: SARS Coronavirus 2 Ag: NEGATIVE

## 2024-02-24 MED ORDER — AMOXICILLIN-POT CLAVULANATE 875-125 MG PO TABS: 1.0000 | ORAL_TABLET | Freq: Two times a day (BID) | ORAL | 0 refills | Status: DC

## 2024-02-24 MED ORDER — PREDNISONE 20 MG PO TABS: 20.0000 mg | ORAL_TABLET | Freq: Every day | ORAL | 0 refills | Status: DC

## 2024-02-24 MED ORDER — BENZONATATE 100 MG PO CAPS: 200.0000 mg | ORAL_CAPSULE | Freq: Two times a day (BID) | ORAL | 0 refills | Status: DC | PRN

## 2024-02-24 NOTE — Progress Notes (Signed)
 Bethany Grant , 12-21-1940, 83 y.o., female MRN: [id number] Patient Care Team    Relationship Specialty Notifications Start End  McGowen, Aleene DEL, MD PCP - General Family Medicine  02/23/22   Jeffrie Oneil BROCKS, MD PCP - Cardiology Cardiology  01/05/19   Aundria Ladell POUR, MD Consulting Physician Gastroenterology  02/23/22     Chief Complaint  Patient presents with   Nasal Congestion    Over a week; nasal/chest congestion, cough. Pt has not taken anything.      Subjective: Bethany Grant is a 83 y.o. Pt presents for an OV with complaints of nasal congestion of 1 week duration.  Associated symptoms include chest congestion and cough. Pt has tried not taking anything to ease their symptoms.  She has been taking Flonase  sometimes, not routinely.  She was worked up last week for left sided neck pain, she is awaiting results of an US .  She wonders if her discomfort is related to her current symptoms.  She is established w/ pulm and sess them yearly- nodule , emphysema and rheumatoid lung disease. Prior smoker. Not on inhalers at this time.  She is immunosuppressed on Enbrel.     11/17/2023    9:38 AM 11/09/2022    3:51 PM 08/26/2022    9:01 AM 02/23/2022   10:27 AM  Depression screen PHQ 2/9  Decreased Interest 0 0 0 0  Down, Depressed, Hopeless 0 0 0 0  PHQ - 2 Score 0 0 0 0  Altered sleeping  0    Tired, decreased energy  0    Change in appetite  0    Feeling bad or failure about yourself   0    Trouble concentrating  0    Moving slowly or fidgety/restless  0    Suicidal thoughts  0    PHQ-9 Score  0    Difficult doing work/chores  Not difficult at all      Allergies  Allergen Reactions   Other Nausea And Vomiting and Other (See Comments)    Doesn't do well with pain  Medications, sensitivity to tape   Hydrocodone-Acetaminophen  Nausea And Vomiting and Other (See Comments)   Trazodone  And Nefazodone Other (See Comments)    Fatigue/hangover effect   Social History    Social History Narrative   Married   Educ: HS   Occup:   24 pack-yr smoking hx, quit approx 2005   No alc   Past Medical History:  Diagnosis Date   Anxiety    Bradycardia    Sinus bradycardia with right bundle but block and LAFB-->avoid BBs and CCBs   Chronic renal insufficiency, stage 3 (moderate) (HCC)    GFR 40s   Emphysema lung (HCC)    noted on CT scanning   GERD (gastroesophageal reflux disease)    Hypercholesterolemia    Hypertension    Insomnia    Osteopenia 09/2017   T score -2.1 FRAX 23% and 8.3% (has RA).   Pulmonary nodule    followed by Dr. Shellia   Rheumatoid arthritis(714.0)    hands and feet   Right knee pain    ortho->inj summer 2023 helpful   Thrombocytopenia (HCC)    mild, chronic   Past Surgical History:  Procedure Laterality Date   ABDOMINAL HYSTERECTOMY  1991   TAH/BSO   BARTHOLIN GLAND CYST EXCISION  1980   RIGHT   BREAST SURGERY  1988   BREAST CYST   COLONOSCOPY N/A 08/24/2013  Procedure: COLONOSCOPY;  Surgeon: Claudis RAYMOND Rivet, MD;  [facility]: [facility];  Service: Endoscopy;  Laterality: N/A;  100   COLONOSCOPY WITH PROPOFOL  N/A 09/09/2020   diverticulosis and hemorrhoids, o/w normal.  Procedure: COLONOSCOPY WITH PROPOFOL ;  Surgeon: Toledo, Ladell POUR, MD;  Location: [facility];  Service: Gastroenterology;  Laterality: N/A;   CT CORONARY CA SCORING  07/2020   29th%tile   ESOPHAGOGASTRODUODENOSCOPY (EGD) WITH PROPOFOL  N/A 09/09/2020   gastritis and erythematous duodenopathy. Procedure: ESOPHAGOGASTRODUODENOSCOPY (EGD) WITH PROPOFOL ;  Surgeon: Toledo, Ladell POUR, MD;  Location: [facility];  Service: Gastroenterology;  Laterality: N/A;   FOOT SURGERY     2 TIMES   FOOT SURGERY Left    HAND SURGERY     HEMORRHOID SURGERY     been over 20 years ago   OLECRANON BURSECTOMY Right 11/29/2013   Procedure: EXCISION OLECRANON BURSA RIGHT ;  Surgeon: Arley JONELLE Curia, MD;  Location: [facility];  Service: Orthopedics;  Laterality:  Right;   OOPHORECTOMY     BSO   TENOSYNOVECTOMY Right 11/29/2013   Procedure: TENOSYNOVECTOMY FLEXOR TENDON RIGHT WRIST ;  Surgeon: Arley JONELLE Curia, MD;  Location: [facility];  Service: Orthopedics;  Laterality: Right;   WEIL OSTEOTOMY Right 08/10/2018   Procedure: WEIL OSTEOTOMY 2, 3,4 METATARSAL RIGHT FOOT, PROXIMAL INTERPHALANGEAL RESECTION TOES 2, 3, 4;  Surgeon: Harden Jerona GAILS, MD;  Location: [facility];  Service: Orthopedics;  Laterality: Right;   Family History  Problem Relation Age of Onset   Cancer Mother        LUNG   Hypertension Sister    Cancer Sister        LUNG, THYROID , UTERINE   Cancer Brother        LUNG   Cancer Sister        Lung cancer   Colon cancer Neg Hx    Allergies as of 02/24/2024       Reactions   Other Nausea And Vomiting, Other (See Comments)   Doesn't do well with pain  Medications, sensitivity to tape   Hydrocodone-acetaminophen  Nausea And Vomiting, Other (See Comments)   Trazodone  And Nefazodone Other (See Comments)   Fatigue/hangover effect        Medication List        Accurate as of February 24, 2024 10:01 AM. If you have any questions, ask your nurse or doctor.          ALPRAZolam  0.5 MG tablet Commonly known as: XANAX  1 tab po qhs   amLODipine  2.5 MG tablet Commonly known as: NORVASC  Take 1 tablet (2.5 mg total) by mouth daily.   amoxicillin -clavulanate 875-125 MG tablet Commonly known as: AUGMENTIN  Take 1 tablet by mouth 2 (two) times daily. Started by: Charlies Bellini   aspirin  EC 81 MG tablet Take 1 tablet (81 mg total) by mouth daily. Swallow whole.   benzonatate  100 MG capsule Commonly known as: TESSALON  Take 2 capsules (200 mg total) by mouth 2 (two) times daily as needed for cough. Started by: Mikai Meints   betamethasone  dipropionate 0.05 % cream Apply topically 2 (two) times daily. Do not exceed 14 days consecutive use   ciclopirox 0.77 % cream Commonly known as: LOPROX as needed.   cycloSPORINE 0.05  % ophthalmic emulsion Commonly known as: RESTASIS Place 1 drop into both eyes 2 (two) times daily.   Enbrel SureClick 50 MG/ML injection Generic drug: etanercept   fluticasone  50 MCG/ACT nasal spray Commonly known as: FLONASE  Place 1 spray  into both nostrils daily.   pantoprazole  40 MG tablet Commonly known as: PROTONIX  Take 1 tablet (40 mg total) by mouth daily.   predniSONE  20 MG tablet Commonly known as: DELTASONE  Take 1 tablet (20 mg total) by mouth daily with breakfast. Started by: Charlies Bellini   rosuvastatin  10 MG tablet Commonly known as: CRESTOR  Take 1 tablet (10 mg total) by mouth daily.   TYLENOL  PO Take by mouth as needed.   valsartan  40 MG tablet Commonly known as: DIOVAN  Take 1 tablet (40 mg total) by mouth daily.   VITAMIN B-12 PO Take 1,000 mg by mouth daily.   VITAMIN C PO Take 500 mg by mouth daily.   Vitamin D3 25 MCG (1000 UT) Caps Take 1,000 Units by mouth daily.        All past medical history, surgical history, allergies, family history, immunizations andmedications were updated in the EMR today and reviewed under the history and medication portions of their EMR.     Review of Systems  Constitutional:  Positive for malaise/fatigue. Negative for fever.  HENT:  Positive for congestion and sinus pain. Negative for sore throat.   Respiratory:  Positive for cough. Negative for sputum production, shortness of breath and wheezing.   Gastrointestinal: Negative.   Genitourinary: Negative.   Musculoskeletal: Negative.   Neurological:  Negative for dizziness and headaches.   Negative, with the exception of above mentioned in HPI   Objective:  BP 130/80   Pulse 63   Temp 97.6 F (36.4 C)   Wt 158 lb (71.7 kg)   LMP  (LMP Unknown)   SpO2 97%   BMI 24.75 kg/m  Body mass index is 24.75 kg/m. Physical Exam Vitals and nursing note reviewed.  Constitutional:      General: She is not in acute distress.    Appearance: Normal appearance. She  is normal weight. She is not ill-appearing or toxic-appearing.  HENT:     Head: Normocephalic and atraumatic.     Right Ear: Tympanic membrane, ear canal and external ear normal.     Left Ear: Ear canal and external ear normal.     Ears:     Comments: Small effusion right TM. No erythema. Erythema right nare. No ttp sinus. Cough present    Nose: Congestion and rhinorrhea present.     Mouth/Throat:     Mouth: Mucous membranes are moist.     Pharynx: No oropharyngeal exudate or posterior oropharyngeal erythema.  Eyes:     General: No scleral icterus.       Right eye: No discharge.        Left eye: No discharge.     Extraocular Movements: Extraocular movements intact.     Conjunctiva/sclera: Conjunctivae normal.     Pupils: Pupils are equal, round, and reactive to light.  Cardiovascular:     Rate and Rhythm: Normal rate and regular rhythm.  Pulmonary:     Effort: Pulmonary effort is normal. No respiratory distress.     Breath sounds: Rales present. No wheezing or rhonchi.  Musculoskeletal:     Cervical back: Neck supple. Tenderness (left lateral nck) present.  Lymphadenopathy:     Cervical: Cervical adenopathy present.  Skin:    Findings: No rash.  Neurological:     Mental Status: She is alert and oriented to person, place, and time. Mental status is at baseline.     Motor: No weakness.     Coordination: Coordination normal.     Gait: Gait normal.  Psychiatric:        Mood and Affect: Mood normal.        Behavior: Behavior normal.        Thought Content: Thought content normal.        Judgment: Judgment normal.    No results found. No results found. Results for orders placed or performed in visit on 02/24/24 (from the past 24 hours)  POC COVID-19 BinaxNow     Status: Normal   Collection Time: 02/24/24  9:59 AM  Result Value Ref Range   SARS Coronavirus 2 Ag Negative Negative    Assessment/Plan: MARTENA EMANUELE is a 83 y.o. female present for OV for  Congestion of nasal  sinus - POC COVID-19 BinaxNow  Cough/Chest congestion/Acute bronchitis with symptoms > 10 days (Primary) Rest, hydrate.  +/- flonase , mucinex (DM if cough), nettie pot or nasal saline.  Prednisone  5 days Tessalon  perles Augmentin  BID prescribed, take until completed.  F/U 2 weeks if not improved. Follow up depending upon results  Reviewed expectations re: course of current medical issues. Discussed self-management of symptoms. Outlined signs and symptoms indicating need for more acute intervention. Patient verbalized understanding and all questions were answered. Patient received an After-Visit Summary.    Orders Placed This Encounter  Procedures   DG Chest 2 View   POC COVID-19 BinaxNow   Meds ordered this encounter  Medications   benzonatate  (TESSALON ) 100 MG capsule    Sig: Take 2 capsules (200 mg total) by mouth 2 (two) times daily as needed for cough.    Dispense:  30 capsule    Refill:  0   predniSONE  (DELTASONE ) 20 MG tablet    Sig: Take 1 tablet (20 mg total) by mouth daily with breakfast.    Dispense:  10 tablet    Refill:  0   amoxicillin -clavulanate (AUGMENTIN ) 875-125 MG tablet    Sig: Take 1 tablet by mouth 2 (two) times daily.    Dispense:  20 tablet    Refill:  0   Referral Orders  No referral(s) requested today     Note is dictated utilizing voice recognition software. Although note has been proof read prior to signing, occasional typographical errors still can be missed. If any questions arise, please do not hesitate to call for verification.   electronically signed by:  Charlies Bellini, DO  [facility] - [place]

## 2024-02-24 NOTE — Patient Instructions (Addendum)
 Follow up depending upon results       Great to see you today.  I have refilled the medication(s) we provide.   If labs were collected or images ordered, we will inform you of  results once we have received them and reviewed. We will contact you either by echart message, or telephone call.  Please give ample time to the testing facility, and our office to run,  receive and review results. Please do not call inquiring of results, even if you can see them in your chart. We will contact you as soon as we are able. If it has been over 1 week since the test was completed, and you have not yet heard from us , then please call us .    - echart message- for normal results that have been seen by the patient already.   - telephone call: abnormal results or if patient has not viewed results in their echart.  If a referral to a specialist was entered for you, please call us  in 2 weeks if you have not heard from the specialist office to schedule.

## 2024-02-28 ENCOUNTER — Other Ambulatory Visit: Payer: Self-pay

## 2024-02-28 MED ORDER — ROSUVASTATIN CALCIUM 10 MG PO TABS: 10.0000 mg | ORAL_TABLET | Freq: Every day | ORAL | 0 refills | Status: DC

## 2024-03-16 ENCOUNTER — Ambulatory Visit: Admitting: Family Medicine

## 2024-03-16 ENCOUNTER — Encounter: Payer: Self-pay | Admitting: Family Medicine

## 2024-03-16 VITALS — BP 127/71 | HR 50 | Temp 97.8°F | Wt 158.4 lb

## 2024-03-16 DIAGNOSIS — M542 Cervicalgia: Secondary | ICD-10-CM

## 2024-03-16 DIAGNOSIS — J209 Acute bronchitis, unspecified: Secondary | ICD-10-CM | POA: Diagnosis not present

## 2024-03-16 DIAGNOSIS — R09A2 Foreign body sensation, throat: Secondary | ICD-10-CM

## 2024-03-16 NOTE — Progress Notes (Signed)
 OFFICE VISIT  03/16/2024  CC:  Chief Complaint  Patient presents with   Follow-up    2 week follow up. She reports her BP has been high and thinks it may be due to pain. She stopped taking Pantoprazole  a week ago.    Patient is a 83 y.o. female who presents for 3-week follow-up of acute bronchitis.  She was seen by Dr. Catherine on 02/24/2024.  Chest x-ray negative for infiltrate. She was treated with prednisone  and Augmentin .  INTERIM HX: Cough has completely cleared up.  Still with focal pain in the left anterior neck region.  When she swallows she can feel like something is in the throat on the left.  No difficulty swallowing, though. She clears her throat a lot.  Denies sore throat or classic GERD symptoms. She had been on PPI for quite a while but stopped it about a week ago and has not noted any change in anything. She saw ENT 03/09/2024, normal exam.  Soft tissue neck ultrasound 02/16/2024 did not show any abnormalities.  She has a history of cervical arthritis, probably a combination of her RA and osteoarthritis.  She does not have much in the way of classic cervical arthritis pain, though.  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   Pulmonary nodule    followed by Dr. Shellia   Rheumatoid arthritis(714.0)    hands and feet   Right knee pain    ortho->inj summer 2023 helpful   Thrombocytopenia (HCC)    mild, chronic    Past Surgical History:  Procedure Laterality Date   ABDOMINAL HYSTERECTOMY  1991   TAH/BSO   BARTHOLIN GLAND CYST EXCISION  1980   RIGHT   BREAST SURGERY  1988   BREAST CYST    COLONOSCOPY N/A 08/24/2013   Procedure: COLONOSCOPY;  Surgeon: Claudis RAYMOND Rivet, MD;  [facility];  Service: Endoscopy;  Laterality: N/A;  100   COLONOSCOPY WITH PROPOFOL  N/A 09/09/2020   diverticulosis and hemorrhoids, o/w normal.  Procedure: COLONOSCOPY WITH PROPOFOL ;  Surgeon: Toledo, Ladell POUR, MD;  Location: [facility];  Service: Gastroenterology;  Laterality: N/A;   CT CORONARY CA SCORING  07/2020   29th%tile   ESOPHAGOGASTRODUODENOSCOPY (EGD) WITH PROPOFOL  N/A 09/09/2020   gastritis and erythematous duodenopathy. Procedure: ESOPHAGOGASTRODUODENOSCOPY (EGD) WITH PROPOFOL ;  Surgeon: Toledo, Ladell POUR, MD;  Location: [facility];  Service: Gastroenterology;  Laterality: N/A;   FOOT SURGERY     2 TIMES   FOOT SURGERY Left    HAND SURGERY     HEMORRHOID SURGERY     been over 20 years ago   OLECRANON BURSECTOMY Right 11/29/2013   Procedure: EXCISION OLECRANON BURSA RIGHT ;  Surgeon: Arley JONELLE Curia, MD;  Location: [facility];  Service: Orthopedics;  Laterality: Right;   OOPHORECTOMY     BSO   TENOSYNOVECTOMY Right 11/29/2013   Procedure: TENOSYNOVECTOMY FLEXOR TENDON RIGHT WRIST ;  Surgeon: Arley JONELLE Curia, MD;  Location: [facility];  Service: Orthopedics;  Laterality: Right;  WEIL OSTEOTOMY Right 08/10/2018   Procedure: WEIL OSTEOTOMY 2, 3,4 METATARSAL RIGHT FOOT, PROXIMAL INTERPHALANGEAL RESECTION TOES 2, 3, 4;  Surgeon: Harden Jerona GAILS, MD;  Location: [facility];  Service: Orthopedics;  Laterality: Right;    Outpatient Medications Prior to Visit  Medication Sig Dispense Refill   Acetaminophen  (TYLENOL  PO) Take by mouth as needed.     ALPRAZolam  (XANAX ) 0.5 MG tablet 1 tab po qhs 90 tablet 1   amLODipine  (NORVASC ) 2.5 MG tablet Take 1 tablet (2.5 mg total) by mouth daily. 90 tablet 1   Ascorbic Acid (VITAMIN C PO) Take 500 mg by mouth daily.     aspirin  EC 81 MG tablet Take 1 tablet (81 mg total) by mouth daily. Swallow whole. 90 tablet 3    Cholecalciferol (VITAMIN D3) 25 MCG (1000 UT) CAPS Take 1,000 Units by mouth daily.     ciclopirox (LOPROX) 0.77 % cream as needed.     Cyanocobalamin (VITAMIN B-12 PO) Take 1,000 mg by mouth daily.     cycloSPORINE (RESTASIS) 0.05 % ophthalmic emulsion Place 1 drop into both eyes 2 (two) times daily.     etanercept (ENBREL SURECLICK) 50 MG/ML injection      fluticasone  (FLONASE ) 50 MCG/ACT nasal spray Place 1 spray into both nostrils daily. 16 g 2   rosuvastatin  (CRESTOR ) 10 MG tablet Take 1 tablet (10 mg total) by mouth daily. 90 tablet 0   valsartan  (DIOVAN ) 40 MG tablet Take 1 tablet (40 mg total) by mouth daily. 90 tablet 3   betamethasone  dipropionate 0.05 % cream Apply topically 2 (two) times daily. Do not exceed 14 days consecutive use (Patient not taking: Reported on 03/16/2024) 30 g 0   pantoprazole  (PROTONIX ) 40 MG tablet Take 1 tablet (40 mg total) by mouth daily. (Patient not taking: Reported on 03/16/2024) 90 tablet 3   amoxicillin -clavulanate (AUGMENTIN ) 875-125 MG tablet Take 1 tablet by mouth 2 (two) times daily. 20 tablet 0   benzonatate  (TESSALON ) 100 MG capsule Take 2 capsules (200 mg total) by mouth 2 (two) times daily as needed for cough. 30 capsule 0   predniSONE  (DELTASONE ) 20 MG tablet Take 1 tablet (20 mg total) by mouth daily with breakfast. 10 tablet 0   No facility-administered medications prior to visit.    Allergies  Allergen Reactions   Other Nausea And Vomiting and Other (See Comments)    Doesn't do well with pain  Medications, sensitivity to tape   Hydrocodone-Acetaminophen  Nausea And Vomiting and Other (See Comments)   Trazodone  And Nefazodone Other (See Comments)    Fatigue/hangover effect    Review of Systems As per HPI  PE:    03/16/2024   10:38 AM 02/24/2024    9:18 AM 02/16/2024    9:43 AM  Vitals with BMI  Weight 158 lbs 6 oz 158 lbs   BMI 24.8 24.74   Systolic 127 130 881  Diastolic 71 80 60  Pulse 50 63      Physical Exam  Gen:  Alert, well appearing.  Patient is oriented to person, place, time, and situation. AFFECT: pleasant, lucid thought and speech. Oropharynx without lesion or swelling. Neck has focal tenderness over the area of the left carotid pulsation pretty focally.  No mass. CV: RRR, no m/r/g.   LUNGS: CTA bilat, nonlabored resps, good aeration in all lung fields.   LABS:  Last CBC Lab Results  Component Value Date   WBC 8.0 08/13/2023   HGB 12.5 08/13/2023   HCT 38.4  08/13/2023   MCV 84.2 08/13/2023   MCH 27.4 08/13/2023   RDW 12.8 08/13/2023   PLT 163 08/13/2023   Last metabolic panel Lab Results  Component Value Date   GLUCOSE 89 02/16/2024   NA 138 02/16/2024   K 4.9 02/16/2024   CL 100 02/16/2024   CO2 28 02/16/2024   BUN 15 02/16/2024   CREATININE 1.11 (H) 02/16/2024   EGFR 50 (L) 02/16/2024   CALCIUM  9.7 02/16/2024   PROT 7.5 02/16/2024   ALBUMIN 3.6 07/08/2023   LABGLOB 3.0 08/19/2021   AGRATIO 1.3 08/19/2021   BILITOT 0.5 02/16/2024   ALKPHOS 64 07/08/2023   AST 20 02/16/2024   ALT 11 02/16/2024   ANIONGAP 11 07/08/2023   IMPRESSION AND PLAN:  #1 acute bronchitis, resolved with prednisone  and Augmentin .  2.  Anterior left neck pain, focal. Unclear etiology but sounds more consistent with globus sensation related to LPR. PPI did not help, though. Soft tissue ultrasound normal. Exam reassuring. She does have a history of cervical spondylosis so she could have developed an osteophyte that is causing some discomfort in her neck/swallowing. The only cervical spine x-ray I have in the EMR is from 2012. She is going to mention her pain to her rheumatologist at follow-up next week and will call me if she wants to move forward with C-spine x-ray.  An After Visit Summary was printed and given to the patient.  FOLLOW UP: No follow-ups on file.  Signed:  Gerlene Hockey, MD           03/16/2024

## 2024-03-30 ENCOUNTER — Other Ambulatory Visit: Payer: Self-pay

## 2024-04-04 ENCOUNTER — Other Ambulatory Visit: Payer: Self-pay | Admitting: Family Medicine

## 2024-04-04 DIAGNOSIS — I10 Essential (primary) hypertension: Secondary | ICD-10-CM

## 2024-04-04 MED ORDER — AMLODIPINE BESYLATE 2.5 MG PO TABS: 2.5000 mg | ORAL_TABLET | Freq: Every day | ORAL | 0 refills | Status: DC

## 2024-04-04 NOTE — Telephone Encounter (Signed)
 Copied from CRM [id number]. Topic: Clinical - Medication Refill >> Apr 04, 2024 11:51 AM Macario HERO wrote: Medication: amLODipine  (NORVASC ) 2.5 MG tablet [[id number]]  Has the patient contacted their pharmacy? Yes (Agent: If no, request that the patient contact the pharmacy for the refill. If patient does not wish to contact the pharmacy document the reason why and proceed with request.) (Agent: If yes, when and what did the pharmacy advise?)  This is the patient's preferred pharmacy:  [id number] - [place], [place] - [phone] [place] [address] Phone: [phone] Fax: [phone]  Is this the correct pharmacy for this prescription? Yes If no, delete pharmacy and type the correct one.   Has the prescription been filled recently? No  Is the patient out of the medication? Yes  Has the patient been seen for an appointment in the last year OR does the patient have an upcoming appointment? Yes  Can we respond through MyChart? Yes  Agent: Please be advised that Rx refills may take up to 3 business days. We ask that you follow-up with your pharmacy.

## 2024-04-05 ENCOUNTER — Other Ambulatory Visit: Payer: Medicare HMO

## 2024-05-18 ENCOUNTER — Ambulatory Visit: Admitting: Family Medicine

## 2024-05-18 ENCOUNTER — Encounter: Payer: Self-pay | Admitting: Family Medicine

## 2024-05-18 VITALS — BP 138/78 | HR 50 | Temp 97.5°F | Wt 156.6 lb

## 2024-05-18 DIAGNOSIS — I10 Essential (primary) hypertension: Secondary | ICD-10-CM | POA: Diagnosis not present

## 2024-05-18 DIAGNOSIS — F5105 Insomnia due to other mental disorder: Secondary | ICD-10-CM

## 2024-05-18 DIAGNOSIS — N2889 Other specified disorders of kidney and ureter: Secondary | ICD-10-CM | POA: Diagnosis not present

## 2024-05-18 DIAGNOSIS — Z79899 Other long term (current) drug therapy: Secondary | ICD-10-CM

## 2024-05-18 DIAGNOSIS — E7800 Pure hypercholesterolemia, unspecified: Secondary | ICD-10-CM

## 2024-05-18 DIAGNOSIS — F99 Mental disorder, not otherwise specified: Secondary | ICD-10-CM

## 2024-05-18 MED ORDER — VALSARTAN 40 MG PO TABS: 40.0000 mg | ORAL_TABLET | Freq: Every day | ORAL | 3 refills | Status: AC

## 2024-05-18 MED ORDER — ROSUVASTATIN CALCIUM 10 MG PO TABS: 10.0000 mg | ORAL_TABLET | Freq: Every day | ORAL | 3 refills | Status: AC

## 2024-05-18 MED ORDER — ALPRAZOLAM 0.5 MG PO TABS: ORAL_TABLET | ORAL | 1 refills | Status: AC

## 2024-05-18 MED ORDER — AMLODIPINE BESYLATE 2.5 MG PO TABS: 2.5000 mg | ORAL_TABLET | Freq: Every day | ORAL | 3 refills | Status: AC

## 2024-05-18 NOTE — Patient Instructions (Signed)
 Check your blood pressure and your heart rate 1-2 times per week and write the numbers down for review with me at next follow-up. If your pressure is consistently greater than 150 on top or 90 on the bottom then make an appointment to follow-up.  Otherwise I will see you again in the office in 3 months.

## 2024-05-18 NOTE — Progress Notes (Signed)
 OFFICE VISIT  05/18/2024  CC:  Chief Complaint  Patient presents with   Follow-up    3 month rci. Pt wants to know If she needs to get the pneumonia vaccine. Flu vaccine completed at the pharmacy.    Patient is a 83 y.o. female who presents for 16-month follow-up hypertension, hyperlipidemia, chronic renal insufficiency, and anxiety related insomnia. A/P as of last visit: 1) HTN, well controlled on valsartan  40 mg a day and amlodipine  2.5 mg a day. Check electrolytes and creatinine today.   2) anxiety related insomnia. Will continue with long-term nightly use of alprazolam  0.5 mg. Controlled substance contract updated.  Urine drug screen obtained today.   #3 hyperlipidemia, doing well on rosuvastatin  10 mg a day. LDL was 53 about 6 months ago. Lipid and hepatic panel today.   #4 CRI III. She avoids NSAIDs. Monitor basic metabolic panel today.  INTERIM HX: Bethany Grant feels well.  Rarely monitors blood pressure at home.  Alprazolam  helps her get to sleep and maintain sleep. PMP AWARE reviewed today: most recent rx for alprazolam  was filled 04/02/2024, # 90, rx by me. No red flags.  ROS as above, plus--> no fevers, no CP, no SOB, no wheezing, no cough, no dizziness, no HAs, no rashes, no melena/hematochezia.  No polyuria or polydipsia.  No focal weakness, paresthesias, or tremors.  No acute vision or hearing abnormalities.  No dysuria or unusual/new urinary urgency or frequency.  No recent changes in lower legs. No n/v/d or abd pain.  No palpitations.    Past Medical History:  Diagnosis Date   Anxiety    Bradycardia    Sinus bradycardia with right bundle but block and LAFB-->avoid BBs and CCBs   Chronic renal insufficiency, stage 3 (moderate)    GFR 40s   Emphysema lung (HCC)    noted on CT scanning   GERD (gastroesophageal reflux disease)    Hypercholesterolemia    Hypertension    Insomnia    Osteopenia 09/2017   T score -2.1 FRAX 23% and 8.3% (has RA).   Pulmonary  nodule    followed by Dr. Shellia   Rheumatoid arthritis(714.0)    hands and feet and neck   Right knee pain    ortho->inj summer 2023 helpful   Thrombocytopenia    mild, chronic    Past Surgical History:  Procedure Laterality Date   ABDOMINAL HYSTERECTOMY  1991   TAH/BSO   BARTHOLIN GLAND CYST EXCISION  1980   RIGHT   BREAST SURGERY  1988   BREAST CYST   COLONOSCOPY N/A 08/24/2013   Procedure: COLONOSCOPY;  Surgeon: Claudis RAYMOND Rivet, MD;  Location: [facility];  Service: Endoscopy;  Laterality: N/A;  100   COLONOSCOPY WITH PROPOFOL  N/A 09/09/2020   diverticulosis and hemorrhoids, o/w normal.  Procedure: COLONOSCOPY WITH PROPOFOL ;  Surgeon: Toledo, Ladell POUR, MD;  Location: [facility];  Service: Gastroenterology;  Laterality: N/A;   CT CORONARY CA SCORING  07/2020   29th%tile   ESOPHAGOGASTRODUODENOSCOPY (EGD) WITH PROPOFOL  N/A 09/09/2020   gastritis and erythematous duodenopathy. Procedure: ESOPHAGOGASTRODUODENOSCOPY (EGD) WITH PROPOFOL ;  Surgeon: Toledo, Ladell POUR, MD;  Location: [facility];  Service: Gastroenterology;  Laterality: N/A;   FOOT SURGERY     2 TIMES   FOOT SURGERY Left    HAND SURGERY     HEMORRHOID SURGERY     been over 20 years ago   OLECRANON BURSECTOMY Right 11/29/2013   Procedure: EXCISION OLECRANON BURSA RIGHT ;  Surgeon: Arley JONELLE Curia, MD;  Location: [facility];  Service: Orthopedics;  Laterality: Right;   OOPHORECTOMY     BSO   TENOSYNOVECTOMY Right 11/29/2013   Procedure: TENOSYNOVECTOMY FLEXOR TENDON RIGHT WRIST ;  Surgeon: Arley JONELLE Curia, MD;  Location: [facility];  Service: Orthopedics;  Laterality: Right;   WEIL OSTEOTOMY Right 08/10/2018   Procedure: WEIL OSTEOTOMY 2, 3,4 METATARSAL RIGHT FOOT, PROXIMAL INTERPHALANGEAL RESECTION TOES 2, 3, 4;  Surgeon: Harden Jerona GAILS, MD;  Location: [facility];  Service: Orthopedics;  Laterality: Right;    Outpatient Medications Prior to Visit  Medication Sig Dispense Refill    Acetaminophen  (TYLENOL  PO) Take by mouth as needed.     Ascorbic Acid (VITAMIN C PO) Take 500 mg by mouth daily.     aspirin  EC 81 MG tablet Take 1 tablet (81 mg total) by mouth daily. Swallow whole. 90 tablet 3   Cholecalciferol (VITAMIN D3) 25 MCG (1000 UT) CAPS Take 1,000 Units by mouth daily.     ciclopirox (LOPROX) 0.77 % cream as needed.     Cyanocobalamin (VITAMIN B-12 PO) Take 1,000 mg by mouth daily.     cycloSPORINE (RESTASIS) 0.05 % ophthalmic emulsion Place 1 drop into both eyes 2 (two) times daily.     etanercept (ENBREL SURECLICK) 50 MG/ML injection      fluticasone  (FLONASE ) 50 MCG/ACT nasal spray Place 1 spray into both nostrils daily. 16 g 2   ALPRAZolam  (XANAX ) 0.5 MG tablet 1 tab po qhs 90 tablet 1   valsartan  (DIOVAN ) 40 MG tablet Take 1 tablet (40 mg total) by mouth daily. 90 tablet 3   betamethasone  dipropionate 0.05 % cream Apply topically 2 (two) times daily. Do not exceed 14 days consecutive use (Patient not taking: Reported on 05/18/2024) 30 g 0   amLODipine  (NORVASC ) 2.5 MG tablet Take 1 tablet (2.5 mg total) by mouth daily. 45 tablet 0   pantoprazole  (PROTONIX ) 40 MG tablet Take 1 tablet (40 mg total) by mouth daily. (Patient not taking: Reported on 05/18/2024) 90 tablet 3   rosuvastatin  (CRESTOR ) 10 MG tablet Take 1 tablet (10 mg total) by mouth daily. 90 tablet 0   No facility-administered medications prior to visit.    Allergies  Allergen Reactions   Other Nausea And Vomiting and Other (See Comments)    Doesn't do well with pain  Medications, sensitivity to tape   Hydrocodone-Acetaminophen  Nausea And Vomiting and Other (See Comments)   Trazodone  And Nefazodone Other (See Comments)    Fatigue/hangover effect    Review of Systems As per HPI  PE:    05/18/2024    9:54 AM 05/18/2024    9:45 AM 03/16/2024   10:38 AM  Vitals with BMI  Weight  156 lbs 10 oz 158 lbs 6 oz  BMI   24.8  Systolic 138 147 872  Diastolic 78 80 71  Pulse  50 50      Physical Exam  Gen: Alert, well appearing.  Patient is oriented to person, place, time, and situation. AFFECT: pleasant, lucid thought and speech. CV: RRR, no m/r/g.   LUNGS: CTA bilat, nonlabored resps, good aeration in all lung fields. EXT: no clubbing or cyanosis.  no edema.    LABS:  Last CBC Lab Results  Component Value Date   WBC 8.0 08/13/2023   HGB 12.5 08/13/2023   HCT 38.4 08/13/2023   MCV 84.2 08/13/2023   MCH 27.4 08/13/2023   RDW 12.8 08/13/2023   PLT 163 08/13/2023   Last  metabolic panel Lab Results  Component Value Date   GLUCOSE 89 02/16/2024   NA 138 02/16/2024   K 4.9 02/16/2024   CL 100 02/16/2024   CO2 28 02/16/2024   BUN 15 02/16/2024   CREATININE 1.11 (H) 02/16/2024   EGFR 50 (L) 02/16/2024   CALCIUM  9.7 02/16/2024   PROT 7.5 02/16/2024   ALBUMIN 3.6 07/08/2023   LABGLOB 3.0 08/19/2021   AGRATIO 1.3 08/19/2021   BILITOT 0.5 02/16/2024   ALKPHOS 64 07/08/2023   AST 20 02/16/2024   ALT 11 02/16/2024   ANIONGAP 11 07/08/2023   Last lipids Lab Results  Component Value Date   CHOL 138 02/16/2024   HDL 59 02/16/2024   LDLCALC 63 02/16/2024   TRIG 77 02/16/2024   CHOLHDL 2.3 02/16/2024   Last hemoglobin A1c Lab Results  Component Value Date   HGBA1C 5.8 (H) 08/13/2023   Last thyroid  functions Lab Results  Component Value Date   TSH 5.57 (H) 11/17/2023   T3TOTAL 98 11/17/2023   T4TOTAL 7.2 05/19/2023   FREET4 1.0 11/17/2023   IMPRESSION AND PLAN:  1) HTN, fairly well controlled on valsartan  40 mg a day and amlodipine  2.5 mg a day. Check electrolytes and creatinine today. I will have her monitor her blood pressure at home 1-2 times a week.  If BP greater than 150 systolic or greater than 90 diastolic she will make a return appointment.  Otherwise I will see her back in 3 months to review her measurements.  2) anxiety related insomnia. Will continue with long-term nightly use of alprazolam  0.5 mg. #90, refill x 1 today. CSC  and UDS UTD.   3) hyperlipidemia, doing well on rosuvastatin  10 mg a day. LDL was 63 about 3 months ago. Repeat lipid panel in 3 months.   #4 CRI III. She avoids NSAIDs. Monitor basic metabolic panel today.  An After Visit Summary was printed and given to the patient.  FOLLOW UP: Return in about 3 months (around 08/18/2024) for routine chronic illness f/u.  Signed:  Gerlene Hockey, MD           05/18/2024

## 2024-05-19 ENCOUNTER — Ambulatory Visit: Payer: Self-pay | Admitting: Family Medicine

## 2024-05-19 LAB — BASIC METABOLIC PANEL WITH GFR
BUN/Creatinine Ratio: 15 (calc) (ref 6–22)
BUN: 18 mg/dL (ref 7–25)
CO2: 29 mmol/L (ref 20–32)
Calcium: 9.3 mg/dL (ref 8.6–10.4)
Chloride: 101 mmol/L (ref 98–110)
Creat: 1.19 mg/dL — ABNORMAL HIGH (ref 0.60–0.95)
Glucose, Bld: 83 mg/dL (ref 65–99)
Potassium: 4.8 mmol/L (ref 3.5–5.3)
Sodium: 138 mmol/L (ref 135–146)
eGFR: 46 mL/min/1.73m2 — ABNORMAL LOW (ref >=60)

## 2024-06-24 ENCOUNTER — Other Ambulatory Visit: Payer: Self-pay | Admitting: Urgent Care

## 2024-06-24 DIAGNOSIS — I10 Essential (primary) hypertension: Secondary | ICD-10-CM

## 2024-06-27 ENCOUNTER — Other Ambulatory Visit: Payer: Self-pay | Admitting: Family Medicine

## 2024-06-27 DIAGNOSIS — I10 Essential (primary) hypertension: Secondary | ICD-10-CM

## 2024-06-27 NOTE — Telephone Encounter (Unsigned)
 Copied from CRM #[id number]. Topic: Clinical - Medication Refill >> Jun 27, 2024 10:48 AM Dedra B wrote: Medication: valsartan  (DIOVAN ) 40 MG tablet amLODipine  (NORVASC ) 2.5 MG tablet   Has the patient contacted their pharmacy? Yes, she said pharmacy told her to call office for refills.   This is the patient's preferred pharmacy:  [id number] - [place], [place] - [address] [place] [address] Phone: [phone] Fax: [phone]  Is this the correct pharmacy for this prescription? Yes  Has the prescription been filled recently? No  Is the patient out of the medication? No, few left  Has the patient been seen for an appointment in the last year OR does the patient have an upcoming appointment? Yes  Can we respond through MyChart? Yes  Agent: Please be advised that Rx refills may take up to 3 business days. We ask that you follow-up with your pharmacy.

## 2024-08-07 ENCOUNTER — Encounter: Payer: Self-pay | Admitting: Podiatry

## 2024-08-07 ENCOUNTER — Ambulatory Visit: Admitting: Podiatry

## 2024-08-07 DIAGNOSIS — M79671 Pain in right foot: Secondary | ICD-10-CM | POA: Diagnosis not present

## 2024-08-07 DIAGNOSIS — M898X7 Other specified disorders of bone, ankle and foot: Secondary | ICD-10-CM | POA: Diagnosis not present

## 2024-08-07 DIAGNOSIS — B351 Tinea unguium: Secondary | ICD-10-CM | POA: Diagnosis not present

## 2024-08-07 DIAGNOSIS — M79672 Pain in left foot: Secondary | ICD-10-CM | POA: Diagnosis not present

## 2024-08-07 NOTE — Progress Notes (Signed)
 Patient presents for evaluation and treatment of tenderness and some redness around nails feet.  Tenderness around toes with walking and wearing shoes.  Exostosis gives her problems with wearing shoes dorsal aspect of right foot at the first TMT.  She has rheumatoid arthritis.  Takes Enbrel .  Also has renal disease.  Physical exam:  General appearance: Alert, pleasant, and in no acute distress.  Vascular: Pedal pulses: DP 2/4 B/L, PT 2 to/4 B/L.  Mild edema lower legs bilaterally.  Capillary refill time immediate bilaterally  Neurologic:  Dermatologic:  Nails thickened, disfigured, discolored 1-5 BL with subungual debris.  Redness and hypertrophic nail folds along nail folds bilaterally but no signs of drainage or infection.  Musculoskeletal:  Prominent exostosis at the dorsal aspect of the first TMT right.  Slight soreness with pressure.  Rheumatoid nodule plantar aspect of the foot between the 2nd and 3rd metatarsal heads.  Prominent plantarly.   Diagnosis: 1. Painful onychomycotic nails 1 through 5 bilaterally. 2. Pain toes 1 through 5 bilaterally. 3.  Exostosis first TMT right  Plan: -New patient office visit for evaluation and management level 3.  Modifier 25. - Discussed exostosis resulting from rheumatoid arthritis in the dorsal aspect of the foot at first TMT right.  Recommended alternative lacing's and/or aperture pad to help offload this area.  He has also got a small rheumatoid nodule in the plantar aspect of the right foot into the 2nd and 3rd MTPs.  She can use an aperture pad here 2.  Dispensed and aperture pads for her to use.  -Debrided onychomycotic nails 1 through 5 bilaterally.  Sharply debrided nails with nail clipper and reduced with a power bur.  Return 3 months RFC

## 2024-08-18 ENCOUNTER — Encounter: Payer: Self-pay | Admitting: Family Medicine

## 2024-08-18 ENCOUNTER — Ambulatory Visit (INDEPENDENT_AMBULATORY_CARE_PROVIDER_SITE_OTHER): Admitting: Family Medicine

## 2024-08-18 VITALS — BP 131/73 | HR 45 | Temp 96.8°F | Ht 67.0 in | Wt 158.6 lb

## 2024-08-18 DIAGNOSIS — N2889 Other specified disorders of kidney and ureter: Secondary | ICD-10-CM | POA: Diagnosis not present

## 2024-08-18 DIAGNOSIS — E7800 Pure hypercholesterolemia, unspecified: Secondary | ICD-10-CM

## 2024-08-18 DIAGNOSIS — I10 Essential (primary) hypertension: Secondary | ICD-10-CM

## 2024-08-18 NOTE — Progress Notes (Signed)
 OFFICE VISIT  08/18/2024  CC:  Chief Complaint  Patient presents with   Medical Management of Chronic Issues    Pt is fasting    Patient is a 84 y.o. female who presents for 79-month follow-up hypertension, hyperlipidemia, and chronic renal insufficiency. A/P as of last visit: 1) HTN, fairly well controlled on valsartan  40 mg a day and amlodipine  2.5 mg a day. Check electrolytes and creatinine today. I will have her monitor her blood pressure at home 1-2 times a week.  If BP greater than 150 systolic or greater than 90 diastolic she will make a return appointment.  Otherwise I will see her back in 3 months to review her measurements.   2) anxiety related insomnia. Will continue with long-term nightly use of alprazolam  0.5 mg. #90, refill x 1 today. CSC and UDS UTD.   3) hyperlipidemia, doing well on rosuvastatin  10 mg a day. LDL was 63 about 3 months ago. Repeat lipid panel in 3 months.   #4 CRI III. She avoids NSAIDs. Monitor basic metabolic panel today.  INTERIM HX: Labs all stable last visit.  Feeling well. Home blood pressures average 135/65.  Heart rate average 60.  ROS --> no fevers, no CP, no SOB, no wheezing, no cough, no dizziness, no HAs, no rashes, no melena/hematochezia.  No polyuria or polydipsia. No focal weakness, paresthesias, or tremors.  No acute vision or hearing abnormalities.  No dysuria or unusual/new urinary urgency or frequency.  No recent changes in lower legs. No n/v/d or abd pain.  No palpitations.     Past Medical History:  Diagnosis Date   Anxiety    Bradycardia    Sinus bradycardia with right bundle but block and LAFB-->avoid BBs and CCBs   Chronic renal insufficiency, stage 3 (moderate)    GFR 40s   DDD (degenerative disc disease), cervical    Emphysema lung (HCC)    noted on CT scanning   GERD (gastroesophageal reflux disease)    Hypercholesterolemia    Hypertension    Insomnia    Osteopenia 09/2017   T score -2.1 FRAX 23% and  8.3% (has RA).   Pulmonary nodule    followed by Dr. Shellia   Rheumatoid arthritis(714.0)    hands and feet and neck   Right knee pain    ortho->inj summer 2023 helpful   Thrombocytopenia    mild, chronic    Past Surgical History:  Procedure Laterality Date   ABDOMINAL HYSTERECTOMY  1991   TAH/BSO   BARTHOLIN GLAND CYST EXCISION  1980   RIGHT   BREAST SURGERY  1988   BREAST CYST   CATARACT EXTRACTION, BILATERAL Bilateral 03/2024   COLONOSCOPY N/A 08/24/2013   Procedure: COLONOSCOPY;  Surgeon: Claudis RAYMOND Rivet, MD;  [facility]: [facility];  Service: Endoscopy;  Laterality: N/A;  100   COLONOSCOPY WITH PROPOFOL  N/A 09/09/2020   diverticulosis and hemorrhoids, o/w normal.  Procedure: COLONOSCOPY WITH PROPOFOL ;  Surgeon: Toledo, Ladell POUR, MD;  Location: [facility];  Service: Gastroenterology;  Laterality: N/A;   CT CORONARY CA SCORING  07/2020   29th%tile   ESOPHAGOGASTRODUODENOSCOPY (EGD) WITH PROPOFOL  N/A 09/09/2020   gastritis and erythematous duodenopathy. Procedure: ESOPHAGOGASTRODUODENOSCOPY (EGD) WITH PROPOFOL ;  Surgeon: Toledo, Ladell POUR, MD;  Location: [facility];  Service: Gastroenterology;  Laterality: N/A;   FOOT SURGERY     2 TIMES   FOOT SURGERY Left    HAND SURGERY     HEMORRHOID SURGERY     been over 20 years ago  OLECRANON BURSECTOMY Right 11/29/2013   Procedure: EXCISION OLECRANON BURSA RIGHT ;  Surgeon: Arley JONELLE Curia, MD;  Location: [facility];  Service: Orthopedics;  Laterality: Right;   OOPHORECTOMY     BSO   TENOSYNOVECTOMY Right 11/29/2013   Procedure: TENOSYNOVECTOMY FLEXOR TENDON RIGHT WRIST ;  Surgeon: Arley JONELLE Curia, MD;  Location: [facility];  Service: Orthopedics;  Laterality: Right;   WEIL OSTEOTOMY Right 08/10/2018   Procedure: WEIL OSTEOTOMY 2, 3,4 METATARSAL RIGHT FOOT, PROXIMAL INTERPHALANGEAL RESECTION TOES 2, 3, 4;  Surgeon: Harden Jerona GAILS, MD;  Location: [facility];  Service: Orthopedics;  Laterality: Right;     Outpatient Medications Prior to Visit  Medication Sig Dispense Refill   Acetaminophen  (TYLENOL  PO) Take by mouth as needed.     ALPRAZolam  (XANAX ) 0.5 MG tablet 1 tab po qhs 90 tablet 1   amLODipine  (NORVASC ) 2.5 MG tablet Take 1 tablet (2.5 mg total) by mouth daily. 90 tablet 3   Ascorbic Acid (VITAMIN C PO) Take 500 mg by mouth daily.     aspirin  EC 81 MG tablet Take 1 tablet (81 mg total) by mouth daily. Swallow whole. 90 tablet 3   Cholecalciferol (VITAMIN D3) 25 MCG (1000 UT) CAPS Take 1,000 Units by mouth daily.     ciclopirox (LOPROX) 0.77 % cream as needed.     Cyanocobalamin  (VITAMIN B-12 PO) Take 1,000 mg by mouth daily.     cycloSPORINE (RESTASIS) 0.05 % ophthalmic emulsion Place 1 drop into both eyes 2 (two) times daily.     etanercept (ENBREL SURECLICK) 50 MG/ML injection      rosuvastatin  (CRESTOR ) 10 MG tablet Take 1 tablet (10 mg total) by mouth daily. 90 tablet 3   valsartan  (DIOVAN ) 40 MG tablet Take 1 tablet (40 mg total) by mouth daily. 90 tablet 3   fluticasone  (FLONASE ) 50 MCG/ACT nasal spray Place 1 spray into both nostrils daily. 16 g 2   betamethasone  dipropionate 0.05 % cream Apply topically 2 (two) times daily. Do not exceed 14 days consecutive use (Patient not taking: Reported on 08/07/2024) 30 g 0   No facility-administered medications prior to visit.    Allergies[1]  Review of Systems As per HPI  PE:    08/18/2024    9:51 AM 05/18/2024    9:54 AM 05/18/2024    9:45 AM  Vitals with BMI  Height 5' 7    Weight 158 lbs 10 oz  156 lbs 10 oz  BMI 24.83    Systolic 131 138 852  Diastolic 73 78 80  Pulse 45  50     Physical Exam  Gen: Alert, well appearing.  Patient is oriented to person, place, time, and situation. AFFECT: pleasant, lucid thought and speech. CV: RRR (heart rate 60 by me), no m/r/g.   LUNGS: CTA bilat, nonlabored resps, good aeration in all lung fields. EXT: no edema  LABS:  Last metabolic panel Lab Results  Component  Value Date   GLUCOSE 83 05/18/2024   NA 138 05/18/2024   K 4.8 05/18/2024   CL 101 05/18/2024   CO2 29 05/18/2024   BUN 18 05/18/2024   CREATININE 1.19 (H) 05/18/2024   EGFR 46 (L) 05/18/2024   CALCIUM  9.3 05/18/2024   PROT 7.5 02/16/2024   ALBUMIN 3.6 07/08/2023   LABGLOB 3.0 08/19/2021   AGRATIO 1.3 08/19/2021   BILITOT 0.5 02/16/2024   ALKPHOS 64 07/08/2023   AST 20 02/16/2024   ALT 11 02/16/2024  ANIONGAP 11 07/08/2023   Lab Results  Component Value Date   CHOL 138 02/16/2024   HDL 59 02/16/2024   LDLCALC 63 02/16/2024   TRIG 77 02/16/2024   CHOLHDL 2.3 02/16/2024   Lab Results  Component Value Date   HGBA1C 5.8 (H) 08/13/2023   IMPRESSION AND PLAN:  1) HTN, fairly well controlled on valsartan  40 mg a day and amlodipine  2.5 mg a day. Check electrolytes and creatinine today.   2) anxiety related insomnia. Will continue with long-term nightly use of alprazolam  0.5 mg. #90, refill x 1 today. CSC and UDS UTD.   3) hyperlipidemia, doing well on rosuvastatin  10 mg a day. LDL was 63 about 6 months ago. Lipid panel today.   #4 CRI III. She avoids NSAIDs. Monitor basic metabolic panel today.  An After Visit Summary was printed and given to the patient.  FOLLOW UP: Return in about 3 months (around 11/16/2024) for routine chronic illness f/u.  Signed:  Gerlene Hockey, MD           08/18/2024     [1]  Allergies Allergen Reactions   Other Nausea And Vomiting and Other (See Comments)    Doesn't do well with pain  Medications, sensitivity to tape   Hydrocodone-Acetaminophen  Nausea And Vomiting and Other (See Comments)   Trazodone  And Nefazodone Other (See Comments)    Fatigue/hangover effect

## 2024-08-19 LAB — BASIC METABOLIC PANEL WITH GFR
BUN/Creatinine Ratio: 16 (calc) (ref 6–22)
BUN: 17 mg/dL (ref 7–25)
CO2: 30 mmol/L (ref 20–32)
Calcium: 9.3 mg/dL (ref 8.6–10.4)
Chloride: 101 mmol/L (ref 98–110)
Creat: 1.06 mg/dL — ABNORMAL HIGH (ref 0.60–0.95)
Glucose, Bld: 93 mg/dL (ref 65–99)
Potassium: 5.2 mmol/L (ref 3.5–5.3)
Sodium: 138 mmol/L (ref 135–146)
eGFR: 53 mL/min/{1.73_m2} — ABNORMAL LOW

## 2024-08-19 LAB — LIPID PANEL
Cholesterol: 117 mg/dL
HDL: 53 mg/dL
LDL Cholesterol (Calc): 49 mg/dL
Non-HDL Cholesterol (Calc): 64 mg/dL
Total CHOL/HDL Ratio: 2.2 (calc)
Triglycerides: 72 mg/dL

## 2024-08-20 ENCOUNTER — Ambulatory Visit: Payer: Self-pay | Admitting: Family Medicine

## 2024-11-07 ENCOUNTER — Ambulatory Visit: Admitting: Podiatry

## 2024-11-16 ENCOUNTER — Ambulatory Visit: Admitting: Family Medicine
